# Patient Record
Sex: Male | Born: 1950 | Race: White | Hispanic: No | Marital: Married | Smoking: Never smoker
Health system: Southern US, Community
[De-identification: ages and names within clinical notes are randomized; demographics above are authoritative.]

## PROBLEM LIST (undated history)

## (undated) DIAGNOSIS — D62 Acute posthemorrhagic anemia: Secondary | ICD-10-CM

## (undated) DIAGNOSIS — C61 Malignant neoplasm of prostate: Secondary | ICD-10-CM

## (undated) DIAGNOSIS — L309 Dermatitis, unspecified: Secondary | ICD-10-CM

## (undated) DIAGNOSIS — I471 Supraventricular tachycardia, unspecified: Secondary | ICD-10-CM

## (undated) DIAGNOSIS — Z86010 Personal history of colonic polyps: Secondary | ICD-10-CM

## (undated) DIAGNOSIS — R972 Elevated prostate specific antigen [PSA]: Secondary | ICD-10-CM

## (undated) DIAGNOSIS — K922 Gastrointestinal hemorrhage, unspecified: Secondary | ICD-10-CM

## (undated) HISTORY — PX: COLONOSCOPY W/ BIOPSIES: SHX1374

## (undated) HISTORY — DX: Dermatitis, unspecified: L30.9

## (undated) HISTORY — PX: PROSTATE BIOPSY: SHX241

## (undated) HISTORY — DX: Gastrointestinal hemorrhage, unspecified: K92.2

---
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
## 2000-03-08 ENCOUNTER — Encounter: Admission: RE | Admit: 2000-03-08 | Discharge: 2000-03-08 | Payer: Self-pay | Admitting: Family Medicine

## 2000-03-08 ENCOUNTER — Encounter: Payer: Self-pay | Admitting: Family Medicine

## 2000-10-24 ENCOUNTER — Ambulatory Visit (HOSPITAL_COMMUNITY): Admission: RE | Admit: 2000-10-24 | Discharge: 2000-10-24 | Payer: Self-pay | Admitting: Orthopedic Surgery

## 2001-01-11 ENCOUNTER — Emergency Department (HOSPITAL_COMMUNITY): Admission: EM | Admit: 2001-01-11 | Discharge: 2001-01-11 | Payer: Self-pay | Admitting: Emergency Medicine

## 2001-01-11 ENCOUNTER — Encounter: Payer: Self-pay | Admitting: Emergency Medicine

## 2001-02-12 HISTORY — PX: ROTATOR CUFF REPAIR: SHX139

## 2004-09-19 ENCOUNTER — Emergency Department (HOSPITAL_COMMUNITY): Admission: EM | Admit: 2004-09-19 | Discharge: 2004-09-20 | Payer: Self-pay | Admitting: Emergency Medicine

## 2005-03-23 ENCOUNTER — Ambulatory Visit: Payer: Self-pay | Admitting: Internal Medicine

## 2005-03-28 ENCOUNTER — Ambulatory Visit: Payer: Self-pay | Admitting: Internal Medicine

## 2005-10-09 ENCOUNTER — Encounter: Admission: RE | Admit: 2005-10-09 | Discharge: 2005-10-09 | Payer: Self-pay | Admitting: Orthopedic Surgery

## 2006-10-18 ENCOUNTER — Encounter: Payer: Self-pay | Admitting: Internal Medicine

## 2007-01-21 ENCOUNTER — Telehealth: Payer: Self-pay | Admitting: Internal Medicine

## 2007-01-22 ENCOUNTER — Ambulatory Visit: Payer: Self-pay | Admitting: Internal Medicine

## 2007-01-22 DIAGNOSIS — J209 Acute bronchitis, unspecified: Secondary | ICD-10-CM | POA: Insufficient documentation

## 2007-03-24 ENCOUNTER — Ambulatory Visit: Payer: Self-pay | Admitting: Internal Medicine

## 2007-03-27 ENCOUNTER — Ambulatory Visit: Payer: Self-pay | Admitting: Internal Medicine

## 2007-03-27 LAB — CONVERTED CEMR LAB
ALT: 13 units/L (ref 0–53)
AST: 25 units/L (ref 0–37)
Albumin: 4 g/dL (ref 3.5–5.2)
Alkaline Phosphatase: 38 units/L — ABNORMAL LOW (ref 39–117)
BUN: 10 mg/dL (ref 6–23)
Basophils Absolute: 0 10*3/uL (ref 0.0–0.1)
Basophils Relative: 0.1 % (ref 0.0–1.0)
Bilirubin Urine: NEGATIVE
Bilirubin, Direct: 0.1 mg/dL (ref 0.0–0.3)
CO2: 31 meq/L (ref 19–32)
Calcium: 9.3 mg/dL (ref 8.4–10.5)
Chloride: 102 meq/L (ref 96–112)
Cholesterol: 153 mg/dL (ref 0–200)
Creatinine, Ser: 1 mg/dL (ref 0.4–1.5)
Eosinophils Absolute: 0 10*3/uL (ref 0.0–0.6)
Eosinophils Relative: 1.3 % (ref 0.0–5.0)
GFR calc Af Amer: 99 mL/min
GFR calc non Af Amer: 82 mL/min
Glucose, Bld: 98 mg/dL (ref 70–99)
HCT: 41.9 % (ref 39.0–52.0)
HDL: 50.3 mg/dL (ref >39.0)
Hemoglobin, Urine: NEGATIVE
Hemoglobin: 14.4 g/dL (ref 13.0–17.0)
Ketones, ur: NEGATIVE mg/dL
LDL Cholesterol: 90 mg/dL (ref 0–99)
Leukocytes, UA: NEGATIVE
Lymphocytes Relative: 53.9 % — ABNORMAL HIGH (ref 12.0–46.0)
MCHC: 34.2 g/dL (ref 30.0–36.0)
MCV: 89.5 fL (ref 78.0–100.0)
Monocytes Absolute: 0.3 10*3/uL (ref 0.2–0.7)
Monocytes Relative: 11.5 % — ABNORMAL HIGH (ref 3.0–11.0)
Neutro Abs: 0.8 10*3/uL — ABNORMAL LOW (ref 1.4–7.7)
Neutrophils Relative %: 33.2 % — ABNORMAL LOW (ref 43.0–77.0)
Nitrite: NEGATIVE
PSA: 0.32 ng/mL (ref 0.10–4.00)
Platelets: 137 10*3/uL — ABNORMAL LOW (ref 150–400)
Potassium: 4.1 meq/L (ref 3.5–5.1)
RBC: 4.69 M/uL (ref 4.22–5.81)
RDW: 12.3 % (ref 11.5–14.6)
Sodium: 139 meq/L (ref 135–145)
Specific Gravity, Urine: 1.02 (ref 1.000–1.03)
TSH: 4.53 microintl units/mL (ref 0.35–5.50)
Total Bilirubin: 0.8 mg/dL (ref 0.3–1.2)
Total CHOL/HDL Ratio: 3
Total Protein, Urine: NEGATIVE mg/dL
Total Protein: 6.9 g/dL (ref 6.0–8.3)
Triglycerides: 64 mg/dL (ref 0–149)
Urine Glucose: NEGATIVE mg/dL
Urobilinogen, UA: 0.2 (ref 0.0–1.0)
VLDL: 13 mg/dL (ref 0–40)
WBC: 2.5 10*3/uL — ABNORMAL LOW (ref 4.5–10.5)
pH: 5.5 (ref 5.0–8.0)

## 2007-04-01 ENCOUNTER — Ambulatory Visit: Payer: Self-pay | Admitting: Internal Medicine

## 2007-04-01 DIAGNOSIS — R799 Abnormal finding of blood chemistry, unspecified: Secondary | ICD-10-CM | POA: Insufficient documentation

## 2007-11-11 ENCOUNTER — Ambulatory Visit: Payer: Self-pay | Admitting: Internal Medicine

## 2008-08-13 ENCOUNTER — Ambulatory Visit: Payer: Self-pay | Admitting: Internal Medicine

## 2008-08-13 DIAGNOSIS — B029 Zoster without complications: Secondary | ICD-10-CM | POA: Insufficient documentation

## 2008-08-13 DIAGNOSIS — M545 Low back pain, unspecified: Secondary | ICD-10-CM | POA: Insufficient documentation

## 2008-08-13 DIAGNOSIS — M79609 Pain in unspecified limb: Secondary | ICD-10-CM | POA: Insufficient documentation

## 2008-08-31 ENCOUNTER — Ambulatory Visit: Payer: Self-pay | Admitting: Internal Medicine

## 2008-08-31 DIAGNOSIS — B079 Viral wart, unspecified: Secondary | ICD-10-CM | POA: Insufficient documentation

## 2008-11-23 ENCOUNTER — Ambulatory Visit: Payer: Self-pay | Admitting: Internal Medicine

## 2009-01-04 ENCOUNTER — Ambulatory Visit: Payer: Self-pay | Admitting: Internal Medicine

## 2009-01-04 LAB — CONVERTED CEMR LAB
ALT: 15 units/L (ref 0–53)
AST: 24 units/L (ref 0–37)
Albumin: 4.3 g/dL (ref 3.5–5.2)
Alkaline Phosphatase: 42 units/L (ref 39–117)
BUN: 11 mg/dL (ref 6–23)
Basophils Absolute: 0 10*3/uL (ref 0.0–0.1)
Basophils Relative: 0.7 % (ref 0.0–3.0)
Bilirubin Urine: NEGATIVE
Bilirubin, Direct: 0.1 mg/dL (ref 0.0–0.3)
CO2: 30 meq/L (ref 19–32)
Calcium: 9.3 mg/dL (ref 8.4–10.5)
Chloride: 107 meq/L (ref 96–112)
Cholesterol: 203 mg/dL — ABNORMAL HIGH (ref 0–200)
Creatinine, Ser: 1 mg/dL (ref 0.4–1.5)
Direct LDL: 134.2 mg/dL
Eosinophils Absolute: 0.1 10*3/uL (ref 0.0–0.7)
Eosinophils Relative: 2.8 % (ref 0.0–5.0)
GFR calc non Af Amer: 81.33 mL/min (ref >60)
Glucose, Bld: 98 mg/dL (ref 70–99)
HCT: 44.3 % (ref 39.0–52.0)
HDL: 48.4 mg/dL (ref >39.00)
Hemoglobin, Urine: NEGATIVE
Hemoglobin: 14.9 g/dL (ref 13.0–17.0)
Ketones, ur: NEGATIVE mg/dL
Leukocytes, UA: NEGATIVE
Lymphocytes Relative: 48 % — ABNORMAL HIGH (ref 12.0–46.0)
Lymphs Abs: 2.3 10*3/uL (ref 0.7–4.0)
MCHC: 33.6 g/dL (ref 30.0–36.0)
MCV: 94.2 fL (ref 78.0–100.0)
Monocytes Absolute: 0.6 10*3/uL (ref 0.1–1.0)
Monocytes Relative: 11.5 % (ref 3.0–12.0)
Neutro Abs: 1.8 10*3/uL (ref 1.4–7.7)
Neutrophils Relative %: 37 % — ABNORMAL LOW (ref 43.0–77.0)
Nitrite: NEGATIVE
PSA: 0.64 ng/mL (ref 0.10–4.00)
Platelets: 157 10*3/uL (ref 150.0–400.0)
Potassium: 4.6 meq/L (ref 3.5–5.1)
RBC: 4.7 M/uL (ref 4.22–5.81)
RDW: 12.3 % (ref 11.5–14.6)
Sodium: 142 meq/L (ref 135–145)
Specific Gravity, Urine: 1.025 (ref 1.000–1.030)
TSH: 2.85 microintl units/mL (ref 0.35–5.50)
Total Bilirubin: 1 mg/dL (ref 0.3–1.2)
Total CHOL/HDL Ratio: 4
Total Protein, Urine: NEGATIVE mg/dL
Total Protein: 6.8 g/dL (ref 6.0–8.3)
Triglycerides: 106 mg/dL (ref 0.0–149.0)
Urine Glucose: NEGATIVE mg/dL
Urobilinogen, UA: 0.2 (ref 0.0–1.0)
VLDL: 21.2 mg/dL (ref 0.0–40.0)
WBC: 4.8 10*3/uL (ref 4.5–10.5)
pH: 5.5 (ref 5.0–8.0)

## 2009-01-11 ENCOUNTER — Ambulatory Visit: Payer: Self-pay | Admitting: Internal Medicine

## 2009-01-11 DIAGNOSIS — R002 Palpitations: Secondary | ICD-10-CM | POA: Insufficient documentation

## 2010-03-14 NOTE — Assessment & Plan Note (Signed)
Summary: BRONCHITIS?/NWS   Vital Signs:  Patient Profile:   60 Years Old Male Weight:      175 pounds O2 Sat:      97 % Temp:     101.5 degrees F oral Pulse rate:   57 / minute BP sitting:   126 / 77  (left arm)  Vitals Entered By: Tora Perches (March 24, 2007 4:23 PM)             Is Patient Diabetic? No     Chief Complaint:  poss. bronchitis.  Acute Visit History:      The patient complains of cough and sore throat.  These symptoms began 3 days ago.  There is no history of wheezing or shortness of breath associated with his cough.        'Cold' or URI symptoms have been present with the sore throat.  There is no history of drooling or recent exposure to strep.        Current Allergies: No known allergies   Past Medical History:    Reviewed history from 01/22/2007 and no changes required:       H/o hot tub related eczema    Family History:    Reviewed history and no changes required:  Social History:    Reviewed history from 01/22/2007 and no changes required:       Married       Current Smoker Cigars       Occupation:   Risk Factors:  Tobacco use:  current    Physical Exam  General:     Well-developed,well-nourished,in no acute distress; alert,appropriate and cooperative throughout examination Ears:     R tM with erythema Nose:     no external erythema.   Mouth:     Erytrh. throat Lungs:     Normal respiratory effort, chest expands symmetrically. Lungs are clear to auscultation, no crackles or wheezes. Heart:     Normal rate and regular rhythm. S1 and S2 normal without gallop, murmur, click, rub or other extra sounds.    Impression & Recommendations:  Problem # 1:  BRONCHITIS, ACUTE (ICD-466.0) Assessment: New  His updated medication list for this problem includes:    Zithromax Z-pak 250 Mg Tabs (Azithromycin) .Marland Kitchen... As directed    His updated medication list for this problem includes:    Zithromax Z-pak 250 Mg Tabs (Azithromycin)  .Marland Kitchen... As directed    Promethazine-codeine 6.25-10 Mg/18ml Syrp (Promethazine-codeine) .Marland Kitchen... 5-10 ml qid prn   Complete Medication List: 1)  Zithromax Z-pak 250 Mg Tabs (Azithromycin) .... As directed 2)  Promethazine-codeine 6.25-10 Mg/56ml Syrp (Promethazine-codeine) .... 5-10 ml qid prn 3)  Fish Oil Oil (Fish oil) 4)  Adult Aspirin Low Strength 81 Mg Tbdp (Aspirin) .... Once daily 5)  Vitamin D3 1000 Unit Tabs (Cholecalciferol) .Marland Kitchen.. 1 qd     Prescriptions: ZITHROMAX Z-PAK 250 MG  TABS (AZITHROMYCIN) as directed  #1 x 0   Entered and Authorized by:   Tresa Garter MD   Signed by:   Tresa Garter MD on 03/24/2007   Method used:   Print then Give to Patient   RxID:   [id number]  ]

## 2010-03-14 NOTE — Assessment & Plan Note (Signed)
Summary: see phone note SD   Vital Signs:  Patient Profile:   60 Years Old Male Weight:      174 pounds Temp:     98.1 degrees F oral Pulse rate:   55 / minute BP sitting:   131 / 84  Vitals Entered By: Tora Perches (January 22, 2007 11:37 AM)             Is Patient Diabetic? No     Chief Complaint:  Cough.  History of Present Illness: The patient presents with complaints of sore throat, fever, cough, sinus congestion and drainge of several days duration. Not better with OTC meds. Chest hurts with coughing. Can't sleep due to cough. The mucus is colored.    Current Allergies: No known allergies   Past Medical History:    H/o hot tub related eczema   Social History:    Married   Risk Factors:  Tobacco use:  never    Physical Exam  General:     Well-developed,well-nourished,in no acute distress; alert,appropriate and cooperative throughout examination Ears:     External ear exam shows no significant lesions or deformities.  Otoscopic examination reveals clear canals, tympanic membranes are intact bilaterally without bulging, retraction, inflammation or discharge. Hearing is grossly normal bilaterally. Nose:     External nasal examination shows no deformity or inflammation. Nasal mucosa are pink and moist without lesions or exudates. Mouth:     eryth. throat. Neck:     No deformities, masses, or tenderness noted. Lungs:     Normal respiratory effort, chest expands symmetrically. Lungs are clear to auscultation, no crackles or wheezes. Heart:     Normal rate and regular rhythm. S1 and S2 normal without gallop, murmur, click, rub or other extra sounds. Abdomen:     Bowel sounds positive,abdomen soft and non-tender without masses, organomegaly or hernias noted.    Impression & Recommendations:  Problem # 1:  BRONCHITIS, ACUTE (ICD-466.0) Assessment: New  His updated medication list for this problem includes:    Zithromax Z-pak 250 Mg Tabs  (Azithromycin) .Marland Kitchen... As directed    Promethazine-codeine 6.25-10 Mg/52ml Syrp (Promethazine-codeine) .Marland Kitchen... 5-10 ml qid prn   Complete Medication List: 1)  Zithromax Z-pak 250 Mg Tabs (Azithromycin) .... As directed 2)  Promethazine-codeine 6.25-10 Mg/47ml Syrp (Promethazine-codeine) .... 5-10 ml qid prn 3)  Fish Oil Oil (Fish oil) 4)  Adult Aspirin Low Strength 81 Mg Tbdp (Aspirin) .... Once daily 5)  Vitamin D3 1000 Unit Tabs (Cholecalciferol) .Marland Kitchen.. 1 qd   Patient Instructions: 1)  Please schedule a follow-up appointment for wellness in 4 months.    Prescriptions: ZITHROMAX Z-PAK 250 MG  TABS (AZITHROMYCIN) as directed  #1 x 0   Entered and Authorized by:   Tresa Garter MD   Signed by:   Tresa Garter MD on 01/22/2007   Method used:   Print then Give to Patient   RxID:   [id number] PROMETHAZINE-CODEINE 6.25-10 MG/5ML  SYRP (PROMETHAZINE-CODEINE) 5-10 ml qid prn  #300 ml x 0   Entered and Authorized by:   Tresa Garter MD   Signed by:   Tresa Garter MD on 01/22/2007   Method used:   Print then Give to Patient   RxID:   [id number]  ]

## 2010-03-14 NOTE — Progress Notes (Signed)
Summary: FEVER, CONGESTION  Phone Note Call from Patient Call back at Home Phone [phone]   Caller: Patient Call For: T Summary of Call: FEVER, COUGH, CHEST CONGESTION. HOME PHONE IS [phone].   DR PLOTNIKOV'S PT.  HE HAS AN APPOINTMENT WITH DR Jonny Ruiz WED MORNING.  COULD HE BE WORKED INTO DR PLOTNIKOVS SCHEDULE INSTEAD OR STAY ON DR Raphael Gibney? Initial call taken by: Hilarie Fredrickson,  January 21, 2007 2:38 PM  Follow-up for Phone Call        I can see him at 11:30 Follow-up by: Tresa Garter MD,  January 21, 2007 5:35 PM  Additional Follow-up for Phone Call Additional follow up Details #1::        Patient informed, in IDX, cx apt w/Dr Jonny Ruiz Additional Follow-up by: Lamar Sprinkles,  January 21, 2007 6:26 PM

## 2010-03-14 NOTE — Assessment & Plan Note (Signed)
Summary: FLU SHOT---AVP---STC  Nurse Visit    Prior Medications: FISH OIL   OIL (FISH OIL) 1 by mouth bid ADULT ASPIRIN LOW STRENGTH 81 MG  TBDP (ASPIRIN) once daily VITAMIN D3 1000 UNIT  TABS (CHOLECALCIFEROL) 1 qd GLUCOSAMINE COMPLEX   TABS (NUTRITIONAL SUPPLEMENTS) 1 by mouth bid Current Allergies: No known allergies     Orders Added: 1)  Admin 1st Vaccine [90471] 2)  Flu Vaccine 60yrs + [16109]  Flu Vaccine Consent Questions     Do you have a history of severe allergic reactions to this vaccine? no    Any prior history of allergic reactions to egg and/or gelatin? no    Do you have a sensitivity to the preservative Thimersol? no    Do you have a past history of Guillan-Barre Syndrome? no    Do you currently have an acute febrile illness? no    Have you ever had a severe reaction to latex? no    Vaccine information given and explained to patient? yes    Are you currently pregnant? no    Lot Number:[id number]   Site Given  Left Deltoid IM-CCC]   ] .opcflu

## 2010-03-14 NOTE — Assessment & Plan Note (Signed)
Summary: FLU SHOT/NWS  Nurse Visit   Allergies: No Known Drug Allergies  Orders Added: 1)  Admin 1st Vaccine [90471] 2)  Flu Vaccine 63yrs + [94854]     Flu Vaccine Consent Questions     Do you have a history of severe allergic reactions to this vaccine? no    Any prior history of allergic reactions to egg and/or gelatin? no    Do you have a sensitivity to the preservative Thimersol? no    Do you have a past history of Guillan-Barre Syndrome? no    Do you currently have an acute febrile illness? no    Have you ever had a severe reaction to latex? no    Vaccine information given and explained to patient? yes    Are you currently pregnant? no    Lot Number:[id number]   Exp Date:08/11/2009   Site Given  Left Deltoid IMu

## 2010-03-14 NOTE — Assessment & Plan Note (Signed)
Summary: CPX/NWS   Vital Signs:  Patient Profile:   60 Years Old Male Weight:      171 pounds Temp:     97.2 degrees F oral Pulse rate:   52 / minute BP sitting:   114 / 67  (right arm)  Vitals Entered By: Tora Perches (April 01, 2007 3:06 PM)             Is Patient Diabetic? No     Chief Complaint:  Preventive Care.  History of Present Illness: The patient presents for a wellness examination     Current Allergies (reviewed today): No known allergies   Past Medical History:    Reviewed history from 01/22/2007 and no changes required:       H/o hot tub related eczema   Family History:    Reviewed history and no changes required:       Old age  Social History:    Reviewed history from 03/24/2007 and no changes required:       Married       Cigars 4 times a year       Occupation:              Never Smoked       Regular exercise-yes, cycling   Risk Factors:  Tobacco use:  never Exercise:  yes   Review of Systems  The patient denies anorexia, weight loss, hoarseness, chest pain, syncope, peripheral edema, prolonged cough, abdominal pain, melena, hematochezia, muscle weakness, depression, abnormal bleeding, and angioedema.     Physical Exam  General:     Well-developed,well-nourished,in no acute distress; alert,appropriate and cooperative throughout examination Head:     Normocephalic and atraumatic without obvious abnormalities. No apparent alopecia or balding. Eyes:     No corneal or conjunctival inflammation noted. EOMI. Perrla. Funduscopic exam benign, without hemorrhages, exudates or papilledema. Vision grossly normal. Ears:     External ear exam shows no significant lesions or deformities.  Otoscopic examination reveals clear canals, tympanic membranes are intact bilaterally without bulging, retraction, inflammation or discharge. Hearing is grossly normal bilaterally. Nose:     External nasal examination shows no deformity or inflammation.  Nasal mucosa are pink and moist without lesions or exudates. Mouth:     Oral mucosa and oropharynx without lesions or exudates.  Teeth in good repair. Neck:     No deformities, masses, or tenderness noted. Chest Wall:     No deformities, masses, tenderness or gynecomastia noted. Lungs:     Normal respiratory effort, chest expands symmetrically. Lungs are clear to auscultation, no crackles or wheezes. Heart:     Normal rate and regular rhythm. S1 and S2 normal without gallop, murmur, click, rub or other extra sounds. Abdomen:     Bowel sounds positive,abdomen soft and non-tender without masses, organomegaly or hernias noted. Rectal:     No external abnormalities noted. Normal sphincter tone. No rectal masses or tenderness. Genitalia:     Testes bilaterally descended without nodularity, tenderness or masses. No scrotal masses or lesions. No penis lesions or urethral discharge. Prostate:     Prostate gland firm and smooth, no enlargement, nodularity, tenderness, mass, asymmetry or induration. Msk:     No deformity or scoliosis noted of thoracic or lumbar spine.   Extremities:     No clubbing, cyanosis, edema, or deformity noted with normal full range of motion of all joints.   Neurologic:     No cranial nerve deficits noted. Station and gait are normal. Plantar  reflexes are down-going bilaterally. DTRs are symmetrical throughout. Sensory, motor and coordinative functions appear intact. Skin:     Intact without suspicious lesions or rashes Psych:     Cognition and judgment appear intact. Alert and cooperative with normal attention span and concentration. No apparent delusions, illusions, hallucinations    Impression & Recommendations:  Problem # 1:  WELL ADULT EXAM (ICD-V70.0) Assessment: Unchanged EKG is OK dT 2023f Orders: EKG w/ Interpretation (93000) T-2 View CXR, Same Day (71020.5TC)   Problem # 2:  CBC, ABNORMAL (ICD-790.99) Assessment: New Poss. postviral Recheck in 2  mo Orders: T-2 View CXR, Same Day (71020.5TC)   Complete Medication List: 1)  Fish Oil Oil (Fish oil) .Marland Kitchen.. 1 by mouth bid 2)  Adult Aspirin Low Strength 81 Mg Tbdp (Aspirin) .... Once daily 3)  Vitamin D3 1000 Unit Tabs (Cholecalciferol) .Marland Kitchen.. 1 qd 4)  Glucosamine Complex Tabs (Nutritional supplements) .Marland Kitchen.. 1 by mouth bid   Patient Instructions: 1)  In 2 months: 2)  TSH prior to visit, ICD-9: 3)  CBC w/ Diff prior to visit, ICD-9: 4)  ESR 790.99    ]

## 2010-03-14 NOTE — Assessment & Plan Note (Signed)
Summary: CPX/BCBS/#/CD   Vital Signs:  Patient profile:   60 year old male Weight:      185 pounds Temp:     97.2 degrees F oral Pulse rate:   54 / minute BP sitting:   126 / 84  (left arm)  Vitals Entered By: Tora Perches (January 11, 2009 3:13 PM) CC: cpx Is Patient Diabetic? No   CC:  cpx.  History of Present Illness: The patient presents for a wellness examination  C/o a couple episodes at night - woke up w/rapid shallow beat x 30-45 sec; stopped w/coughing (last 6 wks ago). No pattern  Preventive Screening-Counseling & Management  Alcohol-Tobacco     Smoking Status: never  Current Medications (verified): 1)  Fish Oil   Oil (Fish Oil) .Marland Kitchen.. 1 By Mouth Bid 2)  Adult Aspirin Low Strength 81 Mg  Tbdp (Aspirin) .... Once Daily 3)  Vitamin D3 1000 Unit  Tabs (Cholecalciferol) .Marland Kitchen.. 1 Qd 4)  Glucosamine Complex   Tabs (Nutritional Supplements) .Marland Kitchen.. 1 By Mouth Bid 5)  Vit C 200mg  .... Once Daily 6)  Multivitamins  Tabs (Multiple Vitamin) .... Once Daily  Allergies (verified): No Known Drug Allergies  Past History:  Past Medical History: Last updated: 08/31/2008 H/o hot tub related eczema H zoster 2010  Past Surgical History: Last updated: 10/18/2006 Rotator cuff repair  right side in 2003  Family History: Old age M had a pacemaker  Social History: Married Cigars 4 times a year Occupation: started a new business  Never Smoked Regular exercise-yes, cycling  Review of Systems  The patient denies anorexia, fever, weight loss, weight gain, vision loss, decreased hearing, hoarseness, chest pain, syncope, dyspnea on exertion, peripheral edema, prolonged cough, headaches, hemoptysis, abdominal pain, melena, hematochezia, severe indigestion/heartburn, hematuria, incontinence, genital sores, muscle weakness, suspicious skin lesions, transient blindness, difficulty walking, depression, unusual weight change, abnormal bleeding, enlarged lymph nodes, angioedema, and  testicular masses.    Physical Exam  General:  Well-developed,well-nourished,in no acute distress; alert,appropriate and cooperative throughout examination Head:  Normocephalic and atraumatic without obvious abnormalities. No apparent alopecia or balding. Eyes:  No corneal or conjunctival inflammation noted. EOMI. Perrla. Ears:  External ear exam shows no significant lesions or deformities.  Otoscopic examination reveals clear canals, tympanic membranes are intact bilaterally without bulging, retraction, inflammation or discharge. Hearing is grossly normal bilaterally. Nose:  External nasal examination shows no deformity or inflammation. Nasal mucosa are pink and moist without lesions or exudates. Mouth:  Oral mucosa and oropharynx without lesions or exudates.  Teeth in good repair. Neck:  No deformities, masses, or tenderness noted. Chest Wall:  No deformities, masses, tenderness or gynecomastia noted. Lungs:  Normal respiratory effort, chest expands symmetrically. Lungs are clear to auscultation, no crackles or wheezes. Heart:  Normal rate and regular rhythm. S1 and S2 normal without gallop, murmur, click, rub or other extra sounds. Abdomen:  Bowel sounds positive,abdomen soft and non-tender without masses, organomegaly or hernias noted. Rectal:  No external abnormalities noted. Normal sphincter tone. No rectal masses or tenderness. Genitalia:  Testes bilaterally descended without nodularity, tenderness or masses. No scrotal masses or lesions. No penis lesions or urethral discharge. Prostate:  Prostate gland firm and smooth, no enlargement, nodularity, tenderness, mass, asymmetry or induration. Msk:  No deformity or scoliosis noted of thoracic or lumbar spine.   Pulses:  R and L carotid,radial,femoral,dorsalis pedis and posterior tibial pulses are full and equal bilaterally Extremities:  No clubbing, cyanosis, edema, or deformity noted with normal full range  of motion of all joints.     Neurologic:  No cranial nerve deficits noted. Station and gait are normal. Plantar reflexes are down-going bilaterally. DTRs are symmetrical throughout. Sensory, motor and coordinative functions appear intact. Skin:  Intact without suspicious lesions or rashes Cervical Nodes:  No lymphadenopathy noted Inguinal Nodes:  No significant adenopathy Psych:  Cognition and judgment appear intact. Alert and cooperative with normal attention span and concentration. No apparent delusions, illusions, hallucinations   Impression & Recommendations:  Problem # 1:  WELL ADULT EXAM (ICD-V70.0) Assessment Comment Only The labs were reviewed with the patient. Health and age related issues were discussed. Available screening tests and vaccinations were discussed as well. Healthy life style including good diet and execise was discussed.  Orders: EKG w/ Interpretation (93000) S brady  Problem # 2:  PALPITATIONS, OCCASIONAL (ICD-785.1) poss SVT Assessment: New Card cons. suggested - he wants to wait and see  Problem # 3:  CBC, ABNORMAL (ICD-790.99) resolved Assessment: Comment Only  Complete Medication List: 1)  Fish Oil Oil (Fish oil) .Marland Kitchen.. 1 by mouth bid 2)  Adult Aspirin Low Strength 81 Mg Tbdp (Aspirin) .... Once daily 3)  Vitamin D3 1000 Unit Tabs (Cholecalciferol) .Marland Kitchen.. 1 qd 4)  Glucosamine Complex Tabs (Nutritional supplements) .Marland Kitchen.. 1 by mouth bid 5)  Vit C 200mg   .... Once daily 6)  Multivitamins Tabs (Multiple vitamin) .... Once daily  Patient Instructions: 1)  Please schedule a follow-up appointment in 12 months. 2)  Try to eat more raw plant food, fresh and dry fruit, raw almonds, leafy vegetables, whole foods and less red meat, less animal fat. Poultry and fish is better for you than pork and beef. Avoid processed foods (canned soups, hot dogs, sausage, bacon , frozen dinners). Avoid corn syrup, high fructose syrup or aspartam and Splenda  containing drinks. Honey, Agave and Stevia are better  sweeteners. Make your own  dressing with olive oil, wine vinegar, lemon juce, garlic etc. for your salads.

## 2010-03-14 NOTE — Assessment & Plan Note (Signed)
Summary: ?shingles/$50/cd   Vital Signs:  Patient profile:   60 year old male Height:      71 inches Weight:      179 pounds BMI:     25.06 Temp:     98 degrees F oral Pulse rate:   67 / minute BP sitting:   110 / 64  (left arm)  Vitals Entered By: Tora Perches (August 13, 2008 4:19 PM) CC: poss. shingles Is Patient Diabetic? No   CC:  poss. shingles.  History of Present Illness: R LBP, rash with dull pain and itching started on last wknd. Not better  Current Medications (verified): 1)  Fish Oil   Oil (Fish Oil) .Marland Kitchen.. 1 By Mouth Bid 2)  Adult Aspirin Low Strength 81 Mg  Tbdp (Aspirin) .... Once Daily 3)  Vitamin D3 1000 Unit  Tabs (Cholecalciferol) .Marland Kitchen.. 1 Qd 4)  Glucosamine Complex   Tabs (Nutritional Supplements) .Marland Kitchen.. 1 By Mouth Bid  Allergies (verified): No Known Drug Allergies  Past History:  Past Medical History: Last updated: 01/22/2007 H/o hot tub related eczema  Past Surgical History: Last updated: 10/18/2006 Rotator cuff repair  right side in 2003  Family History: Last updated: 04/01/2007 Old age  Social History: Last updated: 04/01/2007 Married Cigars 4 times a year Occupation:  Never Smoked Regular exercise-yes, cycling  Review of Systems  The patient denies fever, chest pain, dyspnea on exertion, hemoptysis, and abdominal pain.    Physical Exam  General:  Well-developed,well-nourished,in no acute distress; alert,appropriate and cooperative throughout examination Nose:  External nasal examination shows no deformity or inflammation. Nasal mucosa are pink and moist without lesions or exudates. Mouth:  Oral mucosa and oropharynx without lesions or exudates.  Teeth in good repair. Neck:  No deformities, masses, or tenderness noted. Lungs:  Normal respiratory effort, chest expands symmetrically. Lungs are clear to auscultation, no crackles or wheezes. Heart:  Normal rate and regular rhythm. S1 and S2 normal without gallop, murmur, click, rub or  other extra sounds. Abdomen:  Bowel sounds positive,abdomen soft and non-tender without masses, organomegaly or hernias noted. Msk:  No deformity or scoliosis noted of thoracic or lumbar spine.   Skin:  Multiple erythematous patches with vesicles on R LS spine and R leg and dorsal R foot Psych:  Cognition and judgment appear intact. Alert and cooperative with normal attention span and concentration. No apparent delusions, illusions, hallucinations   Impression & Recommendations:  Problem # 1:  LEG PAIN, RIGHT (ICD-729.5) due to #3 Assessment New  Problem # 2:  LOW BACK PAIN (ICD-724.2)  His updated medication list for this problem includes:    Adult Aspirin Low Strength 81 Mg Tbdp (Aspirin) ..... Once daily    Hydrocodone-acetaminophen 5-325 Mg Tabs (Hydrocodone-acetaminophen) .Marland Kitchen... 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 1 by mouth bid 2)  Adult Aspirin Low Strength 81 Mg Tbdp (Aspirin) .... Once daily 3)  Vitamin D3 1000 Unit Tabs (Cholecalciferol) .Marland Kitchen.. 1 qd 4)  Glucosamine Complex Tabs (Nutritional supplements) .Marland Kitchen.. 1 by mouth bid 5)  Acyclovir 800 Mg Tabs (Acyclovir) .Marland Kitchen.. 1 by mouth 5 times a day x 7 d 6)  Prednisone 10 Mg Tabs (Prednisone) .... Take 40mg  qd for 3 days, then 20 mg qd for 3 days, then 10mg  qd for 6 days,  then stop. take pc. 7)  Triamcinolone Acetonide 0.5 % Crea (Triamcinolone acetonide) .... Use qid prn 8)  Hydrocodone-acetaminophen 5-325 Mg Tabs (Hydrocodone-acetaminophen) .Marland Kitchen.. 1 by mouth up to 4 times per day as needed for pain  Patient Instructions: 1)  .Please schedule a follow-up appointment in 2 weeks. 2)  Try to eat more raw plant food, fresh and dry fruit, raw almonds, leafy vegies, whole foods and less meat, animal fat. Avoid processed foods, (canned soups, hot dogs, sausage , frozen dinners),  animal fat, red meat.   3)  Call if you are not better in a reasonable amount of time or if worse.  Prescriptions: ACYCLOVIR 800 MG TABS (ACYCLOVIR) 1 by mouth 5 times a day x 7 d  #35 x 1   Entered and Authorized by:   Tresa Garter MD   Signed by:   Tresa Garter MD on 08/13/2008   Method used:   Electronically to        [facility] [address] [phone]* (retail)       [address]       [place], [place]  [address]       Ph: [phone] or [phone]       Fax: [phone]   RxID:   [id number] TRIAMCINOLONE ACETONIDE 0.5 % CREA (TRIAMCINOLONE ACETONIDE) use qid prn  #120 g x 1   Entered and Authorized by:   Tresa Garter MD   Signed by:   Tresa Garter MD on 08/13/2008   Method used:   Electronically to        [facility] [address] [phone]* (retail)       [address]       [place], [place]  [address]       Ph: [phone] or [phone]       Fax: [phone]   RxID:   [id number] HYDROCODONE-ACETAMINOPHEN 5-325 MG TABS (HYDROCODONE-ACETAMINOPHEN) 1 by mouth up to 4 times per day as needed for pain  #90 x 0   Entered and Authorized by:   Tresa Garter MD   Signed by:   Tresa Garter MD on 08/13/2008   Method used:   Print then Give to Patient   RxID:   [id number] TRIAMCINOLONE ACETONIDE 0.5 % CREA (TRIAMCINOLONE ACETONIDE) use qid prn  #120 g x 1   Entered and Authorized by:   Tresa Garter MD   Signed by:   Tresa Garter MD on 08/13/2008   Method used:   Print then Give to Patient   RxID:   [id number] PREDNISONE 10 MG  TABS (PREDNISONE) Take 40mg  qd for 3 days, then 20 mg qd for 3 days, then 10mg  qd for 6 days, then stop. Take pc.  #24 x 0   Entered and Authorized by:   Tresa Garter MD   Signed by:   Tresa Garter MD on 08/13/2008   Method used:   Print then Give to Patient   RxID:   [id number] ACYCLOVIR 800 MG TABS (ACYCLOVIR) 1 by mouth 5 times a  day x 7 d  #35 x 1   Entered and Authorized by:   Tresa Garter MD   Signed by:   Tresa Garter MD on 08/13/2008   Method used:   Print then Give to Patient   RxID:   [id number]

## 2010-05-14 HISTORY — PX: APPENDECTOMY: SHX54

## 2010-06-30 NOTE — Op Note (Signed)
[facility]  Patient:    Jacob [name], MODER Visit Number: [id number] MRN: [id number]          Service Type: DSU Location: DAY Attending Physician:  Marlowe Kays Page Proc. Date: 10/24/00 Admit Date:  10/24/2000                             Operative Report  PREOPERATIVE DIAGNOSES: 1. Painful degenerative arthritis acromioclavicular joint. 2. 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  POSTOPERATIVE DIAGNOSES: 1. Painful degenerative arthritis acromioclavicular joint. 2. 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 3. Minor labral disruption.  OPERATION:  Right shoulder arthroscopy with: 1. Debridement of minor labral disruption. 2. Arthroscopic subacromial decompression. 3. Open resection distal clavicle.  SURGEON:  Illene Labrador. [place], M.D.  ASSISTANT:  Dorie Rank, P.A.-C.  ANESTHESIA:  General.  PATHOLOGY AND JUSTIFICATION FOR PROCEDURE:  He is an avid Armed forces operational officer and has had progressive significant pain in the right shoulder.  He was some better with rest but on resumption of activities, the pain has recurred.  On plain x-rays, he has arthritic changes in the [facility] joint and a downsloping acromion.  On MRI, he has edema at the [facility] joint with a large spur on the underneath surface of the clavicle, digging into the rotator cuff as well as acromial impingement on the rotator cuff with partial rotator cuff tear. Accordingly, he is here today for the above-mentioned surgery.  DESCRIPTION OF PROCEDURE:  Satisfactory general anesthesia, beach chair position on the Schlein frame.  The right shoulder girdle was prepped with DuraPrep and draped in a sterile field.  The anatomy of the shoulder joint was marked out, including the coracoid process, lateral portal, and posterior soft spot portal.  The two portals and the subacromial space were all infiltrated with 0.5% Marcaine with  adrenalin.  Through a posterior soft spot portal, I made an atraumatic entrance into the glenohumeral joint.  On inspection, the biceps tendon, rotator cuff, and humeral head were normal.  He had some partial but significant fraying of the labrum without any apparent tear.  I felt that this was pathologic and accordingly, I advanced the scope between the biceps tendon and the subscapularis.  Using a switching stick, I made an anterior portal, over which I placed a 4.5 cannula and introduced the 4.2 shaver and shaved down the abnormal portion of the labrum, which I had checked for detachment, and there was none.  Final pictures were taken.  I then removed fluid from the glenohumeral joint and redirected the scope in the subacromial area.  I then made a lateral portal and introduced a blunt trocar, followed the 4.2 shaver.  He had a large amount of subdeltoid bursitis but also had very narrowed subacromial arch.  A good bit of the bursa was removed with the shaver, and I then introduced the Arthrocare vaporizer.  Initial pictures were taken, and I then began removing soft tissue from around the underneath surface of the acromion as well as the coracoacromial ligament. When I removed sufficient soft tissue, I then introduce a 4.0 bur and began burring down the underneath surface of the acromion.  I then worked back and forth between the bur, the vaporizer, and the shaver, correcting bleeders as we went and continued the decompression until we had very substantial subacromial space.  Final pictures were taken with the arm both at  the side and abducted, showing no residual impingement.  I then withdrew the scope from the subacromial area and made a small open incision over the distal clavicle, identifying the [facility] joint which was badly disrupted and with subperiosteal dissection, dissected out the distal clavicle.  I measured 2 cm from this lateral-most portion, made a mark on the clavicle, and  then undermined the clavicle at this point and protecting with retractors, used a microsaw to remove the distal clavicle.  It had a large spur impaling into the rotator cuff as depicted on the MRI.  After removing some minor spicules of bone on the cut end of the clavicle, I placed bone wax on it and packed the cavity with Gelfoam.  I then closed the fascia with interrupted #1 Vicryl as well as the subcutaneous tissue.  Skin was closed with staples and the portals with 4-0 nylon.  Betadine and Adaptic dry sterile dressing were applied followed by a shoulder immobilizer.  At the time of this dictation, he was on his way to the recovery room in satisfactory condition having tolerated the procedure well. Attending Physician:  Joaquin Courts DD:  10/24/00 TD:  10/24/00 Job: [id number]

## 2010-12-04 ENCOUNTER — Emergency Department (HOSPITAL_COMMUNITY): Payer: BC Managed Care – PPO

## 2010-12-04 ENCOUNTER — Inpatient Hospital Stay (HOSPITAL_COMMUNITY)
Admission: EM | Admit: 2010-12-04 | Discharge: 2010-12-05 | DRG: 452 | Disposition: A | Discharge status: Home / Self Care | Payer: BC Managed Care – PPO | Attending: Internal Medicine | Admitting: Internal Medicine

## 2010-12-04 DIAGNOSIS — K922 Gastrointestinal hemorrhage, unspecified: Secondary | ICD-10-CM

## 2010-12-04 DIAGNOSIS — K648 Other hemorrhoids: Secondary | ICD-10-CM | POA: Diagnosis present

## 2010-12-04 DIAGNOSIS — D62 Acute posthemorrhagic anemia: Secondary | ICD-10-CM | POA: Diagnosis present

## 2010-12-04 DIAGNOSIS — M129 Arthropathy, unspecified: Secondary | ICD-10-CM | POA: Diagnosis present

## 2010-12-04 DIAGNOSIS — D696 Thrombocytopenia, unspecified: Secondary | ICD-10-CM | POA: Diagnosis present

## 2010-12-04 DIAGNOSIS — Z9889 Other specified postprocedural states: Secondary | ICD-10-CM

## 2010-12-04 DIAGNOSIS — I959 Hypotension, unspecified: Secondary | ICD-10-CM | POA: Diagnosis present

## 2010-12-04 DIAGNOSIS — R5381 Other malaise: Secondary | ICD-10-CM | POA: Diagnosis present

## 2010-12-04 DIAGNOSIS — K5730 Diverticulosis of large intestine without perforation or abscess without bleeding: Secondary | ICD-10-CM | POA: Diagnosis present

## 2010-12-04 DIAGNOSIS — Y838 Other surgical procedures as the cause of abnormal reaction of the patient, or of later complication, without mention of misadventure at the time of the procedure: Secondary | ICD-10-CM | POA: Diagnosis present

## 2010-12-04 DIAGNOSIS — E860 Dehydration: Secondary | ICD-10-CM | POA: Diagnosis present

## 2010-12-04 DIAGNOSIS — IMO0002 Reserved for concepts with insufficient information to code with codable children: Principal | ICD-10-CM | POA: Diagnosis present

## 2010-12-04 DIAGNOSIS — R0789 Other chest pain: Secondary | ICD-10-CM | POA: Diagnosis present

## 2010-12-04 DIAGNOSIS — I059 Rheumatic mitral valve disease, unspecified: Secondary | ICD-10-CM | POA: Diagnosis present

## 2010-12-04 LAB — DIFFERENTIAL
Basophils Absolute: 0 10*3/uL (ref 0.0–0.1)
Basophils Absolute: 0 10*3/uL (ref 0.0–0.1)
Basophils Relative: 0 % (ref 0–1)
Basophils Relative: 0 % (ref 0–1)
Eosinophils Absolute: 0.1 10*3/uL (ref 0.0–0.7)
Eosinophils Absolute: 0 10*3/uL (ref 0.0–0.7)
Eosinophils Relative: 1 % (ref 0–5)
Eosinophils Relative: 1 % (ref 0–5)
Lymphocytes Relative: 22 % (ref 12–46)
Lymphocytes Relative: 18 % (ref 12–46)
Lymphs Abs: 1.4 10*3/uL (ref 0.7–4.0)
Lymphs Abs: 1.2 10*3/uL (ref 0.7–4.0)
Monocytes Absolute: 0.4 10*3/uL (ref 0.1–1.0)
Monocytes Absolute: 0.4 10*3/uL (ref 0.1–1.0)
Monocytes Relative: 6 % (ref 3–12)
Monocytes Relative: 6 % (ref 3–12)
Neutro Abs: 4.7 10*3/uL (ref 1.7–7.7)
Neutro Abs: 4.8 10*3/uL (ref 1.7–7.7)
Neutrophils Relative %: 71 % (ref 43–77)
Neutrophils Relative %: 75 % (ref 43–77)

## 2010-12-04 LAB — CBC
HCT: 27.3 % — ABNORMAL LOW (ref 39.0–52.0)
HCT: 26 % — ABNORMAL LOW (ref 39.0–52.0)
HCT: 27.8 % — ABNORMAL LOW (ref 39.0–52.0)
HCT: 27.2 % — ABNORMAL LOW (ref 39.0–52.0)
Hemoglobin: 9.4 g/dL — ABNORMAL LOW (ref 13.0–17.0)
Hemoglobin: 8.7 g/dL — ABNORMAL LOW (ref 13.0–17.0)
Hemoglobin: 9.5 g/dL — ABNORMAL LOW (ref 13.0–17.0)
Hemoglobin: 9.2 g/dL — ABNORMAL LOW (ref 13.0–17.0)
MCH: 31.4 pg (ref 26.0–34.0)
MCH: 30.6 pg (ref 26.0–34.0)
MCH: 30.9 pg (ref 26.0–34.0)
MCH: 30.8 pg (ref 26.0–34.0)
MCHC: 34.4 g/dL (ref 30.0–36.0)
MCHC: 33.5 g/dL (ref 30.0–36.0)
MCHC: 34.2 g/dL (ref 30.0–36.0)
MCHC: 33.8 g/dL (ref 30.0–36.0)
MCV: 91.3 fL (ref 78.0–100.0)
MCV: 91.5 fL (ref 78.0–100.0)
MCV: 90.6 fL (ref 78.0–100.0)
MCV: 91 fL (ref 78.0–100.0)
Platelets: 124 10*3/uL — ABNORMAL LOW (ref 150–400)
Platelets: 104 10*3/uL — ABNORMAL LOW (ref 150–400)
Platelets: 118 10*3/uL — ABNORMAL LOW (ref 150–400)
Platelets: 113 10*3/uL — ABNORMAL LOW (ref 150–400)
RBC: 2.99 MIL/uL — ABNORMAL LOW (ref 4.22–5.81)
RBC: 2.84 MIL/uL — ABNORMAL LOW (ref 4.22–5.81)
RBC: 3.07 MIL/uL — ABNORMAL LOW (ref 4.22–5.81)
RBC: 2.99 MIL/uL — ABNORMAL LOW (ref 4.22–5.81)
RDW: 12.6 % (ref 11.5–15.5)
RDW: 12.7 % (ref 11.5–15.5)
RDW: 13.2 % (ref 11.5–15.5)
RDW: 13.7 % (ref 11.5–15.5)
WBC: 6.7 10*3/uL (ref 4.0–10.5)
WBC: 6.4 10*3/uL (ref 4.0–10.5)
WBC: 5.6 10*3/uL (ref 4.0–10.5)
WBC: 4.8 10*3/uL (ref 4.0–10.5)

## 2010-12-04 LAB — BASIC METABOLIC PANEL
BUN: 29 mg/dL — ABNORMAL HIGH (ref 6–23)
CO2: 24 mEq/L (ref 19–32)
Calcium: 7.9 mg/dL — ABNORMAL LOW (ref 8.4–10.5)
Chloride: 110 mEq/L (ref 96–112)
Creatinine, Ser: 1.05 mg/dL (ref 0.50–1.35)
GFR calc Af Amer: 87 mL/min — ABNORMAL LOW (ref >90)
GFR calc non Af Amer: 75 mL/min — ABNORMAL LOW (ref >90)
Glucose, Bld: 117 mg/dL — ABNORMAL HIGH (ref 70–99)
Potassium: 3.9 mEq/L (ref 3.5–5.1)
Sodium: 141 mEq/L (ref 135–145)

## 2010-12-04 LAB — CARDIAC PANEL(CRET KIN+CKTOT+MB+TROPI)
CK, MB: 3.3 ng/mL (ref 0.3–4.0)
Relative Index: 2.4 (ref 0.0–2.5)
Total CK: 138 U/L (ref 7–232)
Troponin I: <0.3 ng/mL (ref <0.30)

## 2010-12-04 LAB — ABO/RH: ABO/RH(D): B POS

## 2010-12-04 LAB — PROTIME-INR
INR: 1.18 (ref 0.00–1.49)
Prothrombin Time: 15.3 seconds — ABNORMAL HIGH (ref 11.6–15.2)

## 2010-12-04 LAB — CK TOTAL AND CKMB (NOT AT ARMC)
CK, MB: 3.4 ng/mL (ref 0.3–4.0)
Relative Index: 2.4 (ref 0.0–2.5)
Total CK: 142 U/L (ref 7–232)

## 2010-12-04 LAB — APTT: aPTT: 24 seconds (ref 24–37)

## 2010-12-04 LAB — POCT I-STAT TROPONIN I: Troponin i, poc: 0 ng/mL (ref 0.00–0.08)

## 2010-12-04 LAB — TYPE AND SCREEN: ABO/RH(D): B POS

## 2010-12-04 LAB — TROPONIN I: Troponin I: <0.3 ng/mL (ref <0.30)

## 2010-12-04 MED ORDER — IOHEXOL 300 MG/ML  SOLN
100.0000 mL | Freq: Once | INTRAMUSCULAR | Status: AC | PRN
  Administered 2010-12-04: 100 mL via INTRAVENOUS

## 2010-12-04 NOTE — H&P (Signed)
NAMEOMARRI, EICH NO.:  [id number]  MEDICAL RECORD NO.:  [id number]  LOCATION:  WLED                         FACILITY:  [facility]  PHYSICIAN:  Talmage Nap, MD  DATE OF BIRTH:  11-22-1950  DATE OF ADMISSION:  12/04/2010 DATE OF DISCHARGE:                             HISTORY & PHYSICAL   PRIMARY CARE PHYSICIAN:  Unassigned.  PRIMARY GASTROENTEROLOGIST:  Dr. Jason Fila at [place].  HISTORY:  Obtainable from the patient and the patient's spouse.  CHIEF COMPLAINT:  Rectal bleeding and abdominal cramp of 1 day's duration following colonoscopy plus polypectomy  HISTORY OF PRESENT ILLNESS:  The patient is a 60 year old very pleasant African American male who had a colonoscopy done by Dr. Jason Fila at [place] on Friday.  The indication for the colonoscopy was an adenomatous mass seen during laparoscopic appendectomy 6 months ago. Colonoscopy was said to have been uneventful and postprocedure the patient was said to have done well and subsequently went home.  However, a day prior to presenting to the emergency room, the patient developed generalized abdominal cramps with associated rectal bleed.  Initially, the rectal bleed was small in quantity, and thereafter, it became unquantifiable.  The patient was said to be feeling very dizzy and lightheaded, and subsequently emergency medical service was called and the patient was brought to the hospital for evaluation.  In the emergency room, the patient was found to be hypotensive and he was rigorously resuscitated with IV fluids and transfused with 1 unit of packed RBCs.  At the time, the patient was seen by me.  He denied any lightheadedness.  He denied any chest pain or shortness of breath and the abdominal cramps had reduced remarkably in intensity.  He was also said to be having slight bleeding per rectum.  GI physician, Dr. Juanda Chance was consulted by the ED physician and an evaluation by her is still being  awaited.  After the patient was seen by me, he will be admitted to step-down for close monitoring.  PAST MEDICAL HISTORY:  Positive for arthritis, which the patient takes Aleve and then mitral valve prolapse for which the patient takes antibiotics 30 minutes before dental procedure.  PAST SURGICAL HISTORY:  Laparoscopic appendectomy with an adenomatous mass seen in the appendix and most recently colonoscopy done 4 days ago prior to this admission [place] by Dr. Jason Fila.  Preadmission meds include aspirin, dose unknown and Aleve dose unknown.  SOCIAL HISTORY:  Positive for occasional tobacco use.  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  FAMILY HISTORY:  York Spaniel to be positive for coronary artery disease.  ALLERGIES:  No known allergies.  REVIEW OF SYSTEMS:  The patient presently denies any headaches and no blurry vision.  No nausea or vomiting.  No fever.  No chills.  No rigor. No chest pain or shortness of breath.  No PND or orthopnea.  No cough. Denies any abdominal discomfort.  He has slight painless rectal bleed. No swelling of the lower extremities.  No intolerance of heat or cold. No neuropsychiatric disorder.  PHYSICAL EXAMINATION:  GENERAL:  Very pleasant man with suboptimal  hydration not in any obvious respiratory distress. VITAL SIGNS:  Initial vital signs prior to blood transfusion, blood pressure was 98/51, pulse 52, respiratory rate 19, temperature 96.6. Vitals after blood transfusion; blood pressure is 104/66, pulse 61, respiratory rate 25. HEENT:  Mild pallor.  Pupils are reactive to light and extraocular muscles are intact. NECK:  No jugular venous distention.  No carotid bruit.  No lymphadenopathy. CHEST:  Clear to auscultation. HEART:  S1 and S2. ABDOMEN:  Soft, nontender, nondistended.  Liver, spleen, kidney not palpable.  Bowel sounds are  positive. EXTREMITIES:  No pedal edema. NEUROLOGIC:  Nonfocal. MUSCULOSKELETAL SYSTEM:  Unremarkable. SKIN:  Shows slightly decreased turgor.  LABORATORY DATA:  First set of cardiac markers, troponin-I 0.00. Hematological indices prior to blood transfusion, WBC is 6.4, hemoglobin is 8.7, hematocrit is 26.0, MCV is 91.5 with a platelet count of 104. Differentials are normal.  Coagulation profile showed PT 15.3, INR 1.18, and a PTT of 24.  Basic metabolic panel showed sodium of 141, potassium of 3.9, chloride of 110, bicarb is 24, glucose is 117, BUN is 29, creatinine is 1.05.  IMPRESSION: 1. Lower gastrointestinal bleed,  post colonoscopy plus     polypectomy (questionably secondary to slippage of ligature. 2. Dehydration. 3. Anemia, status post packed RBC transfusion. 4. Thrombocytopenia. 5. History of mitral valve prolapse. 6. History of arthritis.  PLAN:  To admit the patient to step-down.  The patient will be on normal saline IV to go at a rate of 120 cc an hour.  We will start Protonix 80 mg IV stat bolus and subsequently 8mg /hour IV continuous infusion, and then pain will be controlled with Dilaudid 1 mg IV q.4 h. p.r.n. for pain.  DVT prophylaxis will be done with TED stockings.  Further workup to be done on this patient will include cardiac enzymes q.6 h. x3, H and H q.6 hourly.  CBC, CMP, and magnesium will be repeated in a.m. and imaging study to be ordered will include CT of the abdomen and pelvis with IV contrast today.  I was informed that GI physician, Dr. Juanda Chance, has been consulted and evaluation is being waited.  The patient will be followed and evaluated on a day-to-day basis.     Talmage Nap, MD     CN/MEDQ  D:  12/04/2010  T:  12/04/2010  Job:  [id number]  Electronically Signed by Talmage Nap  on 12/04/2010 01:06:05 PM

## 2010-12-05 DIAGNOSIS — D62 Acute posthemorrhagic anemia: Secondary | ICD-10-CM

## 2010-12-05 DIAGNOSIS — K922 Gastrointestinal hemorrhage, unspecified: Secondary | ICD-10-CM

## 2010-12-05 LAB — CBC
HCT: 26.2 % — ABNORMAL LOW (ref 39.0–52.0)
HCT: 28.4 % — ABNORMAL LOW (ref 39.0–52.0)
Hemoglobin: 9 g/dL — ABNORMAL LOW (ref 13.0–17.0)
Hemoglobin: 9.7 g/dL — ABNORMAL LOW (ref 13.0–17.0)
MCH: 30.9 pg (ref 26.0–34.0)
MCH: 30.4 pg (ref 26.0–34.0)
MCHC: 34.4 g/dL (ref 30.0–36.0)
MCHC: 34.2 g/dL (ref 30.0–36.0)
MCV: 90 fL (ref 78.0–100.0)
MCV: 89 fL (ref 78.0–100.0)
Platelets: 122 10*3/uL — ABNORMAL LOW (ref 150–400)
Platelets: 121 10*3/uL — ABNORMAL LOW (ref 150–400)
RBC: 2.91 MIL/uL — ABNORMAL LOW (ref 4.22–5.81)
RBC: 3.19 MIL/uL — ABNORMAL LOW (ref 4.22–5.81)
RDW: 13.6 % (ref 11.5–15.5)
RDW: 13.5 % (ref 11.5–15.5)
WBC: 4 10*3/uL (ref 4.0–10.5)
WBC: 3.9 10*3/uL — ABNORMAL LOW (ref 4.0–10.5)

## 2010-12-05 LAB — CARDIAC PANEL(CRET KIN+CKTOT+MB+TROPI)
CK, MB: 3 ng/mL (ref 0.3–4.0)
CK, MB: 3 ng/mL (ref 0.3–4.0)
Relative Index: 2.2 (ref 0.0–2.5)
Relative Index: 2.1 (ref 0.0–2.5)
Total CK: 137 U/L (ref 7–232)
Total CK: 143 U/L (ref 7–232)
Troponin I: <0.3 ng/mL (ref <0.30)
Troponin I: <0.3 ng/mL (ref <0.30)

## 2010-12-05 LAB — COMPREHENSIVE METABOLIC PANEL
ALT: 8 U/L (ref 0–53)
AST: 16 U/L (ref 0–37)
Albumin: 2.8 g/dL — ABNORMAL LOW (ref 3.5–5.2)
Alkaline Phosphatase: 28 U/L — ABNORMAL LOW (ref 39–117)
BUN: 7 mg/dL (ref 6–23)
CO2: 25 mEq/L (ref 19–32)
Calcium: 7.9 mg/dL — ABNORMAL LOW (ref 8.4–10.5)
Chloride: 113 mEq/L — ABNORMAL HIGH (ref 96–112)
Creatinine, Ser: 0.8 mg/dL (ref 0.50–1.35)
GFR calc Af Amer: >90 mL/min (ref >90)
GFR calc non Af Amer: >90 mL/min (ref >90)
Glucose, Bld: 99 mg/dL (ref 70–99)
Potassium: 3.6 mEq/L (ref 3.5–5.1)
Sodium: 142 mEq/L (ref 135–145)
Total Bilirubin: 0.3 mg/dL (ref 0.3–1.2)
Total Protein: 4.6 g/dL — ABNORMAL LOW (ref 6.0–8.3)

## 2010-12-05 LAB — MAGNESIUM: Magnesium: 1.6 mg/dL (ref 1.5–2.5)

## 2010-12-05 NOTE — Discharge Summary (Signed)
[name], [name] NO.:  [id number]  MEDICAL RECORD NO.:  [id number]  LOCATION:  1509                         FACILITY:  [facility]  PHYSICIAN:  Talmage Nap, MD  DATE OF BIRTH:  December 25, 1950  DATE OF ADMISSION:  12/04/2010 DATE OF DISCHARGE:  12/05/2010                        DISCHARGE SUMMARY - REFERRING   PRIMARY CARE PHYSICIAN:  Unknown.  PRIMARY GASTROENTEROLOGIST:  Dr. Jason Fila at [place].  CONSULTANT INVOLVED IN THE CASE:  GI, Dr. Leone Payor.  DISCHARGE DIAGNOSES:  Lower gastrointestinal bleed, post colonoscopy plus polypectomy, status post colonoscopy.  Findings: A.  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 B.  Moderate diverticulosis in the sigmoid colon. C.  Internal hemorrhoids.  OTHER DISCHARGE DIAGNOSES: 1. Dehydration. 2. Anemia, status post packed RBC transfusion. 3. Thrombocytopenia. 4. History of mitral valve prolapse. 5. History of arthritis.  HISTORY:  The patient is a 60 year old very pleasant Caucasian male, had colonoscopy done by Dr. Jason Fila at [place] 3 days prior to this admission and was said to have done well post procedure.  However, 24 hours prior to being admitted, the patient developed painless rectal bleed, which was said to be associated with dizzy spell, lightheadedness, and chest pain.  He denied any fever.  He denied any chills.  He denied any rigor.  When the patient presented to the emergency room, he was found to be hypotensive and anemic.  He was given IV boluses of normal saline to stabilize his blood pressure and subsequently transfused 1 unit of packed RBCs.  He was evaluated by the GI physician and subsequently, admitted for colonoscopy.  Please for preadmission medications, past medical history, past surgical history, social history as well as family history, allergies, and review of systems, refer to my initial history and physical dictated by  me, Dr. Talmage Nap on December 04, 2010.  PHYSICAL EXAMINATION:  GENERAL:  At the time the patient was seen by me, he is very pleasant with suboptimal hydration, not in any obvious respiratory distress. VITAL SIGNS:  Initial vital signs on presentation was blood pressure 95/51, pulse was 52, respiratory rate 19, and temperature is 96.6. However, following blood transfusion, blood pressure is 104/66, pulse 61, and respiratory rate was 25.  He was pale. HEENT:  Mild pallor, but pupils are reactive to light and extraocular muscles are intact. NECK:  No jugular venous distention.  No carotid bruit.  No lymphadenopathy. CHEST:  Clear to auscultation. HEART:  Heart sounds are 1 and 2. ABDOMEN:  Soft and nontender.  Liver, spleen, and kidney not palpable. Bowel sounds are positive. EXTREMITIES:  No pedal edema. NEUROLOGIC:  Nonfocal. MUSCULOSKELETAL SYSTEM:  Unremarkable. SKIN:  Shows slightly decreased turgor.  LABORATORY DATA:  Initial complete blood count with differential after blood transfusion, WBCs 6.7, hemoglobin 9.4, hematocrit is 27.3, MCV 91.2 with a platelet count of 124.  Basic metabolic panel showed sodium of 141, potassium of 3.9, chloride of 110, bicarbonate is 24, glucose is 117, BUN is 29, and creatinine is 1.05.  Coagulation profile showed PT 15.3, INR 1.18, and APTT of 24.  Three set of cardiac markers unremarkable.  A repeat complete blood count with no  differential prior to discharge showed WBC of 3.9, hemoglobin of 9.7, hematocrit of 28.4, MCV of 89.0, and platelet count is 121.  Comprehensive metabolic panel showed sodium of 142, potassium of 3.6, chloride of 112 with a bicarbonate of 25, glucose is 99, BUN is 7, creatinine is 0.80, and magnesium level is 1.6.  IMAGING STUDIES:  CT of the abdomen and pelvis with contrast, which showed no evidence of bowel obstruction.  There is colon diverticulosis. There is inflammatory change.  No free air.  HOSPITAL  COURSE:  The patient was admitted to general medical floor after being stabilized in the emergency room with IV fluids and blood transfusion and to have hemoglobin and hematocrit done subsequently.  He was started on normal saline IV to go at a rate of 120 cc an hour and given Protonix 40 mg IV stat and subsequently, Protonix 80 mg per hour continuous infusion.  Pain control was done with Dilaudid 1 mg IV q.4 h. p.r.n.  He was evaluated by the gastroenterologist and subsequently, the patient had colonoscopy done and the findings is as described above. Post colonoscopy, the patient was started on clear liquid diet and this was advanced as tolerated and hemoglobin and hematocrit done thereafter had been stable.  The patient was seen by me today.  No hematochezia. No abdominal discomfort.  Examination is essentially unremarkable. Vital signs, blood pressure is 107/68, temperature is 97.7, pulse 61, respiratory rate 18, and medically stable.  Plan is for the patient to be discharged home today on activity as tolerated and regular diet.  He was advised to be off aspirin and Aleve for the next 3 weeks.  Medications to be taken at home will include, 1. Protonix 40 mg 1 p.o. daily. 2. Fish oil 1000 mg 1 p.o. daily. 3. L-lysine 1 tablet p.o. daily. 4. Multivitamins 1 p.o. daily. 5. Osteo Bi-Flex 1 p.o. daily. 6. Vitamin C 500 mg 1 p.o. daily.    Talmage Nap, MD    CN/MEDQ  D:  12/05/2010  T:  12/05/2010  Job:  [id number]  cc:   Primary Care Physician.  Electronically Signed by Talmage Nap  on 12/05/2010 02:42:17 PM

## 2010-12-05 NOTE — H&P (Signed)
  [name], Jacob [name] NO.:  [id number]  MEDICAL RECORD NO.:  [id number]  LOCATION:  1509                         FACILITY:  [facility]  PHYSICIAN:  Talmage Nap, MD  DATE OF BIRTH:  09/07/1950  DATE OF ADMISSION:  12/04/2010 DATE OF DISCHARGE:                             HISTORY & PHYSICAL   ADDENDUM:  In the initial history and physical dictated by me, the patient was referred to as 61 years old very pleasant African American male, this is incorrect.  The patient is a Caucasian male, that is correct.     Talmage Nap, MD     CN/MEDQ  D:  12/05/2010  T:  12/05/2010  Job:  [id number]  Electronically Signed by Talmage Nap  on 12/05/2010 02:01:27 PM

## 2010-12-08 ENCOUNTER — Encounter: Payer: Self-pay | Admitting: Internal Medicine

## 2010-12-08 ENCOUNTER — Telehealth: Payer: Self-pay | Admitting: Internal Medicine

## 2010-12-08 LAB — CROSSMATCH
ABO/RH(D): B POS
Antibody Screen: NEGATIVE
Unit division: 0
Unit division: 0
Unit division: 0

## 2010-12-08 NOTE — Telephone Encounter (Signed)
Patient was seen in the ER by Dr Leone Payor the ER MD provided him a RX of Protonix.  He is wanting to know if he needs to have this rx filled.  He reports no reflux symptoms at all.  I have advised him probably given for GI prophylaxis and he can check with his primary GI MD if he needs to continue.  They thank Dr Leone Payor for his help with his GI bleed.

## 2010-12-10 NOTE — Telephone Encounter (Signed)
No need for this - I am sorry this happened must have been the hospitalist

## 2010-12-11 ENCOUNTER — Encounter: Payer: Self-pay | Admitting: Internal Medicine

## 2010-12-11 ENCOUNTER — Ambulatory Visit (INDEPENDENT_AMBULATORY_CARE_PROVIDER_SITE_OTHER): Payer: BC Managed Care – PPO | Admitting: Internal Medicine

## 2010-12-11 VITALS — BP 122/88 | HR 51 | Temp 96.6°F | Wt 185.0 lb

## 2010-12-11 DIAGNOSIS — D126 Benign neoplasm of colon, unspecified: Secondary | ICD-10-CM

## 2010-12-11 DIAGNOSIS — Z860100 Personal history of colon polyps, unspecified: Secondary | ICD-10-CM

## 2010-12-11 DIAGNOSIS — R55 Syncope and collapse: Secondary | ICD-10-CM

## 2010-12-11 DIAGNOSIS — K635 Polyp of colon: Secondary | ICD-10-CM

## 2010-12-11 DIAGNOSIS — D62 Acute posthemorrhagic anemia: Secondary | ICD-10-CM

## 2010-12-11 HISTORY — DX: Personal history of colon polyps, unspecified: Z86.0100

## 2010-12-11 HISTORY — DX: Acute posthemorrhagic anemia: D62

## 2010-12-11 HISTORY — DX: Personal history of colonic polyps: Z86.010

## 2010-12-11 MED ORDER — NIFEREX-150 150-50-50 MG PO CAPS: 1.0000 | ORAL_CAPSULE | Freq: Every day | ORAL | Status: DC

## 2010-12-11 NOTE — Assessment & Plan Note (Signed)
resolved 

## 2010-12-11 NOTE — Assessment & Plan Note (Signed)
Removed 2 polyps

## 2010-12-11 NOTE — Assessment & Plan Note (Addendum)
10/12 post polypectomy; Dr Jason Fila S/p repeat colon Dr Leone Payor - ulcer post-polypectomy hepatic flexure Start iron

## 2010-12-11 NOTE — Progress Notes (Signed)
  Subjective:    Patient ID: Jacob [name], male    DOB: [birth year], 60 y.o.   MRN: [id number]  HPI  F/u recent postpolypectomy lower GI bleed and anemia. He passed out x 1. C/o fatigue. Overall better now.  Review of Systems  Constitutional: Positive for fatigue. Negative for appetite change and unexpected weight change.  HENT: Negative for nosebleeds, congestion, sore throat, sneezing, trouble swallowing and neck pain.   Eyes: Negative for itching and visual disturbance.  Respiratory: Negative for cough.   Cardiovascular: Negative for chest pain, palpitations and leg swelling.  Gastrointestinal: Negative for nausea, diarrhea, blood in stool and abdominal distention.  Genitourinary: Negative for frequency and hematuria.  Musculoskeletal: Negative for back pain, joint swelling and gait problem.  Skin: Negative for rash.  Neurological: Negative for dizziness, tremors, speech difficulty and weakness.  Psychiatric/Behavioral: Negative for sleep disturbance, dysphoric mood and agitation. The patient is not nervous/anxious.        Objective:   Physical Exam  Constitutional: He is oriented to person, place, and time. He appears well-developed and well-nourished.  HENT:  Mouth/Throat: Oropharynx is clear and moist.  Eyes: Conjunctivae are normal. Pupils are equal, round, and reactive to light.  Neck: Normal range of motion. No JVD present. No thyromegaly present.  Cardiovascular: Normal rate, regular rhythm, normal heart sounds and intact distal pulses.  Exam reveals no gallop and no friction rub.   No murmur heard. Pulmonary/Chest: Effort normal and breath sounds normal. No respiratory distress. He has no wheezes. He has no rales. He exhibits no tenderness.  Abdominal: Soft. Bowel sounds are normal. He exhibits no distension and no mass. There is no tenderness. There is no rebound and no guarding.  Musculoskeletal: Normal range of motion. He exhibits no edema and no tenderness.    Lymphadenopathy:    He has no cervical adenopathy.  Neurological: He is alert and oriented to person, place, and time. He has normal reflexes. No cranial nerve deficit. He exhibits normal muscle tone. Coordination normal.  Skin: Skin is warm and dry. No rash noted. There is pallor.  Psychiatric: He has a normal mood and affect. His behavior is normal. Judgment and thought content normal.    Hosp records, labs were reviewed      Assessment & Plan:

## 2010-12-18 ENCOUNTER — Telehealth: Payer: Self-pay | Admitting: *Deleted

## 2010-12-18 NOTE — Telephone Encounter (Signed)
Pt calls stating he has a lump where IV was inserted while in hospital in October.  Area is still sore and swollen.  Appt made to see Dr. Posey Rea on Wednesday.  Offered appt with another provider, but pt declined.

## 2010-12-20 ENCOUNTER — Encounter: Payer: Self-pay | Admitting: Internal Medicine

## 2010-12-20 ENCOUNTER — Ambulatory Visit (INDEPENDENT_AMBULATORY_CARE_PROVIDER_SITE_OTHER): Payer: BC Managed Care – PPO | Admitting: Internal Medicine

## 2010-12-20 VITALS — BP 118/80 | HR 76 | Temp 97.9°F | Resp 16 | Wt 185.0 lb

## 2010-12-20 DIAGNOSIS — M7989 Other specified soft tissue disorders: Secondary | ICD-10-CM

## 2010-12-20 DIAGNOSIS — I808 Phlebitis and thrombophlebitis of other sites: Secondary | ICD-10-CM | POA: Insufficient documentation

## 2010-12-20 DIAGNOSIS — M79609 Pain in unspecified limb: Secondary | ICD-10-CM

## 2010-12-20 MED ORDER — DICLOFENAC SODIUM 1.5 % TD SOLN: 3.0000 [drp] | Freq: Four times a day (QID) | TRANSDERMAL | Status: DC | PRN

## 2010-12-20 NOTE — Assessment & Plan Note (Signed)
10/12 R basilic vein - post-IV (x3 wks)  Pennsaid ACE wrap Compresses If not better in 2-3 d - start ASA EC 81 mg a day w/caution

## 2010-12-20 NOTE — Progress Notes (Signed)
  Subjective:    Patient ID: Jacob [name], male    DOB: 11-Oct-1950, 60 y.o.   MRN: [id number]  HPI C/o R ulnar forearm vein pain and swelling since his last colonoscopy 3 wks ago.   Review of Systems  Constitutional: Negative for fever, chills and fatigue.  Respiratory: Negative for chest tightness and shortness of breath.   Cardiovascular: Negative for chest pain and leg swelling.  Musculoskeletal: Negative for back pain.  Psychiatric/Behavioral: The patient is not nervous/anxious.        Objective:   Physical Exam  Constitutional: He appears well-developed.  Cardiovascular: Normal rate.   No murmur heard.      r prox ulnar forearm w/tender eryth swelling and palpable sq band of 12 cm  Pulmonary/Chest: No respiratory distress.  Abdominal: He exhibits no distension. There is no tenderness. There is no rebound.         Procedure Note :    Procedure :   Point of care (POC) sonography examination   Indication: R forearm focal swelling   Equipment used: Sonosite M-Turbo with HFL38x/13-6 MHz transducer linear probe. The images were stored in the unit and later transferred in storage.  The patient was placed in a decubitus position.  This study revealed a hypoechoic  Subcutaneous veins in the prox ulnar region of his  R forearm, non compressible and w/o any doppler flow  12  cm  In length.   Impression: findings c/w superficial phlebitis in R basilic vein.    Assessment & Plan:

## 2010-12-20 NOTE — Patient Instructions (Signed)
Warm compress ACE wrap

## 2010-12-27 ENCOUNTER — Other Ambulatory Visit: Payer: Self-pay | Admitting: Cardiovascular Disease

## 2010-12-27 ENCOUNTER — Encounter: Payer: Self-pay | Admitting: *Deleted

## 2010-12-27 DIAGNOSIS — D62 Acute posthemorrhagic anemia: Secondary | ICD-10-CM

## 2011-01-01 ENCOUNTER — Ambulatory Visit (INDEPENDENT_AMBULATORY_CARE_PROVIDER_SITE_OTHER): Payer: BC Managed Care – PPO | Admitting: Cardiovascular Disease

## 2011-01-01 ENCOUNTER — Ambulatory Visit (INDEPENDENT_AMBULATORY_CARE_PROVIDER_SITE_OTHER): Payer: BC Managed Care – PPO | Admitting: *Deleted

## 2011-01-01 ENCOUNTER — Encounter: Payer: Self-pay | Admitting: Cardiovascular Disease

## 2011-01-01 DIAGNOSIS — R5383 Other fatigue: Secondary | ICD-10-CM

## 2011-01-01 DIAGNOSIS — R0602 Shortness of breath: Secondary | ICD-10-CM

## 2011-01-01 DIAGNOSIS — D62 Acute posthemorrhagic anemia: Secondary | ICD-10-CM

## 2011-01-01 DIAGNOSIS — R5381 Other malaise: Secondary | ICD-10-CM

## 2011-01-01 DIAGNOSIS — R079 Chest pain, unspecified: Secondary | ICD-10-CM

## 2011-01-01 DIAGNOSIS — R0789 Other chest pain: Secondary | ICD-10-CM

## 2011-01-01 NOTE — Patient Instructions (Signed)
We will draw CBC today to evaluate your hematocrit. We will call with results tomorrow. Dr. Elease Hashimoto recommends you have hematocrit checked monthly by your primary physician.  Your physician recommends that you follow up as needed. [phone]

## 2011-01-01 NOTE — Assessment & Plan Note (Signed)
Trinna Post presents today for further evaluation of an episode of chest pain/ pressure that occurred in the middle of a GI bleed. It was associated with significant anemia. His symptoms improved after he received one unit of packed red blood cells.  He was hospitalized and observed overnight. He ruled out for myocardial infarction. He's not had any further episodes of chest discomfort.  He's been out cycling without any significant chest pain or pressure.  We'll check a CBC today. I like to perform a stress echocardiogram but I would like to wait until his hematocrit is normal.  I don't think that a stress echo performed while he still anemic is going to be as useful as he would like.  We will have him check his hematocrit at his medical doctor's office every month until is normal. I anticipate doing a stress echocardiogram in approximately 3 months. I'll see him again on as-needed basis. He's to call me if he has any further problems.

## 2011-01-01 NOTE — Progress Notes (Signed)
Jacob [name] Date of Birth  1950-10-11 [facility] [address]    [address] [place], [place]  [address] [phone]  Fax  [phone]  History of Present Illness:  Jacob [name] is a 60 y.o. gentleman who is a cycling  friend of mine.  He is seen to day for an episode of chest pain that occurred after he had a GI bleed following polypectmy.  Jacob [name] has been in his usual stage of health.  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  He had laproscopic removal of the appendix and had no problems.   A followup colonoscopy in October revealed 2 polyps. He had polypectomy. This was complicated by a significant GI bleed with significant anemia 2 days following the polypectomy. His hemoglobin reached a nadir of 8.7.  During that time,  he had chest pressure while he was in the emergency room.  The chest pressure resolved after he started receiving the transfusion.  He received a total of 1 unit of packed red blood cells (O-).   He felt better after the transfusion and he did not get any any further transfusions.  Since that time he's had generalized fatigue. He's not had any similar episodes of chest pain. He denies any unexplained diaphoresis. He denies any syncope or presyncope.   Current Outpatient Prescriptions on File Prior to Visit  Medication Sig Dispense Refill  . Ascorbic Acid (VITAMIN C) 100 MG tablet Take 200 mg by mouth daily.        Marland Kitchen aspirin 81 MG tablet Take 81 mg by mouth daily.        Marland Kitchen Fe-Succ Ac-C-Thre Ac (NIFEREX-150) 150-50-50 MG CAPS Take 1 capsule by mouth daily.  30 each  3  . Glucosamine HCl-Glucosamin SO4 (GLUCOSAMINE COMPLEX PO) Take 1 each by mouth 2 (two) times daily.        . Multiple Vitamins-Minerals (MULTIVITAL PO) Take 1 each by mouth daily.        . Omega-3 Fatty Acids (FISH OIL) 1000 MG CAPS Take 1 capsule by mouth 2 (two) times daily.          Not on File  Past Medical History  Diagnosis Date  .  Eczema     hot tub related  . Herpes zoster 2010  . GI bleed     s/p polypectomy    Past Surgical History  Procedure Date  . Rotator cuff repair 2003    right  . Appendectomy 05/2010    History  Smoking status  . Current Some Day Smoker  . Types: Cigars  Smokeless tobacco  . Not on file    History  Alcohol Use  . Yes    Family History  Problem Relation Age of Onset  . Heart disease Mother     pacemaker    Reviw of Systems:  Reviewed in the HPI.  All other systems are negative.  Physical Exam: BP 120/72  Pulse 55  Ht 6' (1.829 m)  Wt 188 lb 4 oz (85.39 kg)  BMI 25.53 kg/m2 The patient is alert and oriented x 3.  The mood and affect are normal.   Skin: warm and dry.  Color is normal.    HEENT:   His neck is supple. His mucous membranes are moist. There is no JVD. His carotids are normal.  Lungs: Lung exam is clear.   Heart: Regular S1-S2. No murmurs.    Abdomen: His  abdominal exam is benign. Good bowel sounds. There is no hepatosplenomegaly.  Extremities:  No clubbing cyanosis or edema.  Neuro:  Exam is nonfocal. His gait is normal to    ECG: EKG reveals normal sinus rhythm. He has no ST or T wave changes.  Assessment / Plan:

## 2011-01-02 LAB — CBC WITH DIFFERENTIAL
Basophils Absolute: 0 10*3/uL (ref 0.0–0.2)
Basos: 1 % (ref 0–3)
Eos: 1 % (ref 0–7)
Eosinophils Absolute: 0.1 10*3/uL (ref 0.0–0.4)
HCT: 39.2 % (ref 37.5–51.0)
Hemoglobin: 13.3 g/dL (ref 12.6–17.7)
Immature Grans (Abs): 0 10*3/uL (ref 0.0–0.1)
Immature Granulocytes: 0 % (ref 0–2)
Lymphocytes Absolute: 2.4 10*3/uL (ref 0.7–4.5)
Lymphs: 44 % (ref 14–46)
MCH: 30.1 pg (ref 26.6–33.0)
MCHC: 33.9 g/dL (ref 31.5–35.7)
MCV: 89 fL (ref 79–97)
Monocytes Absolute: 0.6 10*3/uL (ref 0.1–1.0)
Monocytes: 11 % (ref 4–13)
Neutrophils Absolute: 2.3 10*3/uL (ref 1.8–7.8)
Neutrophils Relative %: 43 % (ref 40–74)
Platelets: 196 10*3/uL (ref 140–415)
RBC: 4.42 x10E6/uL (ref 4.14–5.80)
RDW: 13.6 % (ref 12.3–15.4)
WBC: 5.4 10*3/uL (ref 4.0–10.5)

## 2011-03-07 ENCOUNTER — Other Ambulatory Visit (INDEPENDENT_AMBULATORY_CARE_PROVIDER_SITE_OTHER): Payer: BC Managed Care – PPO

## 2011-03-07 DIAGNOSIS — D62 Acute posthemorrhagic anemia: Secondary | ICD-10-CM

## 2011-03-07 LAB — CBC WITH DIFFERENTIAL/PLATELET
Basophils Absolute: 0 10*3/uL (ref 0.0–0.1)
Basophils Relative: 0.7 % (ref 0.0–3.0)
Eosinophils Absolute: 0.1 10*3/uL (ref 0.0–0.7)
Eosinophils Relative: 1.9 % (ref 0.0–5.0)
HCT: 46.2 % (ref 39.0–52.0)
Hemoglobin: 15.7 g/dL (ref 13.0–17.0)
Lymphocytes Relative: 41.2 % (ref 12.0–46.0)
Lymphs Abs: 2.1 10*3/uL (ref 0.7–4.0)
MCHC: 34 g/dL (ref 30.0–36.0)
MCV: 90.8 fl (ref 78.0–100.0)
Monocytes Absolute: 0.7 10*3/uL (ref 0.1–1.0)
Monocytes Relative: 12.9 % — ABNORMAL HIGH (ref 3.0–12.0)
Neutro Abs: 2.2 10*3/uL (ref 1.4–7.7)
Neutrophils Relative %: 43.3 % (ref 43.0–77.0)
Platelets: 184 10*3/uL (ref 150.0–400.0)
RBC: 5.09 Mil/uL (ref 4.22–5.81)
RDW: 13.1 % (ref 11.5–14.6)
WBC: 5.1 10*3/uL (ref 4.5–10.5)

## 2011-03-07 LAB — IBC PANEL
Iron: 63 ug/dL (ref 42–165)
Saturation Ratios: 21.7 % (ref 20.0–50.0)
Transferrin: 207.2 mg/dL — ABNORMAL LOW (ref 212.0–360.0)

## 2011-03-14 ENCOUNTER — Encounter: Payer: Self-pay | Admitting: Internal Medicine

## 2011-03-14 ENCOUNTER — Ambulatory Visit (INDEPENDENT_AMBULATORY_CARE_PROVIDER_SITE_OTHER): Payer: BC Managed Care – PPO | Admitting: Internal Medicine

## 2011-03-14 VITALS — BP 108/80 | HR 68 | Temp 97.2°F | Resp 16 | Wt 190.0 lb

## 2011-03-14 DIAGNOSIS — D62 Acute posthemorrhagic anemia: Secondary | ICD-10-CM

## 2011-03-14 DIAGNOSIS — I808 Phlebitis and thrombophlebitis of other sites: Secondary | ICD-10-CM

## 2011-03-14 DIAGNOSIS — Z23 Encounter for immunization: Secondary | ICD-10-CM

## 2011-03-14 NOTE — Assessment & Plan Note (Signed)
10/12 post polypectomy lower GI bleed; Dr Jason Fila Cont iron x 6 more wks

## 2011-03-14 NOTE — Progress Notes (Signed)
  Subjective:    Patient ID: Jacob [name], male    DOB: Mar 22, 1950, 61 y.o.   MRN: [id number]  HPI  F/u anemia, phlebitis  Review of Systems  Constitutional: Negative for fatigue.  Respiratory: Negative for shortness of breath.   Gastrointestinal: Negative for blood in stool.  Skin: Negative for pallor.       Objective:   Physical Exam  Constitutional: He is oriented to person, place, and time. He appears well-developed.  HENT:  Mouth/Throat: Oropharynx is clear and moist.  Eyes: Conjunctivae are normal. Pupils are equal, round, and reactive to light.  Neck: Normal range of motion. No JVD present. No thyromegaly present.  Cardiovascular: Normal rate, regular rhythm, normal heart sounds and intact distal pulses.  Exam reveals no gallop and no friction rub.   No murmur heard. Pulmonary/Chest: Effort normal and breath sounds normal. No respiratory distress. He has no wheezes. He has no rales. He exhibits no tenderness.  Abdominal: Soft. Bowel sounds are normal. He exhibits no distension and no mass. There is no tenderness. There is no rebound and no guarding.  Musculoskeletal: Normal range of motion. He exhibits no edema and no tenderness.  Lymphadenopathy:    He has no cervical adenopathy.  Neurological: He is alert and oriented to person, place, and time. He has normal reflexes. No cranial nerve deficit. He exhibits normal muscle tone. Coordination normal.  Skin: Skin is warm and dry. No rash noted.  Psychiatric: He has a normal mood and affect. His behavior is normal. Judgment and thought content normal.   Lab Results  Component Value Date   WBC 5.1 03/07/2011   HGB 15.7 03/07/2011   HCT 46.2 03/07/2011   PLT 184.0 03/07/2011   GLUCOSE 99 12/05/2010   CHOL 203* 01/04/2009   TRIG 106.0 01/04/2009   HDL 48.40 01/04/2009   LDLDIRECT 134.2 01/04/2009   LDLCALC 90 03/27/2007   ALT 8 12/05/2010   AST 16 12/05/2010   NA 142 12/05/2010   K 3.6 12/05/2010   CL 113* 12/05/2010   CREATININE 0.80 12/05/2010   BUN 7 12/05/2010   CO2 25 12/05/2010   TSH 2.85 01/04/2009   PSA 0.64 01/04/2009   INR 1.18 12/04/2010          Assessment & Plan:

## 2011-03-14 NOTE — Patient Instructions (Signed)
Take iron x 6 more weeks

## 2011-03-14 NOTE — Assessment & Plan Note (Signed)
resolved 

## 2011-09-03 ENCOUNTER — Telehealth: Payer: Self-pay | Admitting: *Deleted

## 2011-09-03 DIAGNOSIS — Z0389 Encounter for observation for other suspected diseases and conditions ruled out: Secondary | ICD-10-CM

## 2011-09-03 DIAGNOSIS — Z0000 Encounter for general adult medical examination without abnormal findings: Secondary | ICD-10-CM

## 2011-09-03 NOTE — Telephone Encounter (Signed)
Message copied by Merrilyn Puma on Mon Sep 03, 2011  9:50 AM ------      Message from: Newell Coral      Created: Mon Aug 13, 2011  1:48 PM       cpe sch, needs labs ,thanks!

## 2011-09-03 NOTE — Telephone Encounter (Signed)
10/30/11 CPE labs entered.

## 2011-09-12 ENCOUNTER — Ambulatory Visit: Payer: BC Managed Care – PPO | Admitting: Internal Medicine

## 2011-10-24 ENCOUNTER — Other Ambulatory Visit (INDEPENDENT_AMBULATORY_CARE_PROVIDER_SITE_OTHER): Payer: BC Managed Care – PPO

## 2011-10-24 DIAGNOSIS — Z0389 Encounter for observation for other suspected diseases and conditions ruled out: Secondary | ICD-10-CM

## 2011-10-24 DIAGNOSIS — Z0000 Encounter for general adult medical examination without abnormal findings: Secondary | ICD-10-CM

## 2011-10-24 DIAGNOSIS — Z13220 Encounter for screening for lipoid disorders: Secondary | ICD-10-CM

## 2011-10-24 LAB — BASIC METABOLIC PANEL
BUN: 18 mg/dL (ref 6–23)
CO2: 27 mEq/L (ref 19–32)
Calcium: 9.4 mg/dL (ref 8.4–10.5)
Chloride: 105 mEq/L (ref 96–112)
Creatinine, Ser: 1 mg/dL (ref 0.4–1.5)
GFR: 83.45 mL/min (ref >60.00)
Glucose, Bld: 97 mg/dL (ref 70–99)
Potassium: 4.7 mEq/L (ref 3.5–5.1)
Sodium: 140 mEq/L (ref 135–145)

## 2011-10-24 LAB — LIPID PANEL
Cholesterol: 203 mg/dL — ABNORMAL HIGH (ref 0–200)
HDL: 59.1 mg/dL (ref >39.00)
Total CHOL/HDL Ratio: 3
Triglycerides: 78 mg/dL (ref 0.0–149.0)
VLDL: 15.6 mg/dL (ref 0.0–40.0)

## 2011-10-24 LAB — URINALYSIS, ROUTINE W REFLEX MICROSCOPIC
Bilirubin Urine: NEGATIVE
Hgb urine dipstick: NEGATIVE
Ketones, ur: NEGATIVE
Leukocytes, UA: NEGATIVE
Nitrite: NEGATIVE
Specific Gravity, Urine: 1.025 (ref 1.000–1.030)
Total Protein, Urine: NEGATIVE
Urine Glucose: NEGATIVE
Urobilinogen, UA: 0.2 (ref 0.0–1.0)
pH: 6 (ref 5.0–8.0)

## 2011-10-24 LAB — CBC WITH DIFFERENTIAL/PLATELET
Basophils Absolute: 0 10*3/uL (ref 0.0–0.1)
Basophils Relative: 0.5 % (ref 0.0–3.0)
Eosinophils Absolute: 0.1 10*3/uL (ref 0.0–0.7)
Eosinophils Relative: 2.4 % (ref 0.0–5.0)
HCT: 43.7 % (ref 39.0–52.0)
Hemoglobin: 14.5 g/dL (ref 13.0–17.0)
Lymphocytes Relative: 42.3 % (ref 12.0–46.0)
Lymphs Abs: 2.3 10*3/uL (ref 0.7–4.0)
MCHC: 33.2 g/dL (ref 30.0–36.0)
MCV: 93.3 fl (ref 78.0–100.0)
Monocytes Absolute: 0.6 10*3/uL (ref 0.1–1.0)
Monocytes Relative: 10.1 % (ref 3.0–12.0)
Neutro Abs: 2.4 10*3/uL (ref 1.4–7.7)
Neutrophils Relative %: 44.7 % (ref 43.0–77.0)
Platelets: 187 10*3/uL (ref 150.0–400.0)
RBC: 4.68 Mil/uL (ref 4.22–5.81)
RDW: 13.6 % (ref 11.5–14.6)
WBC: 5.5 10*3/uL (ref 4.5–10.5)

## 2011-10-24 LAB — HEPATIC FUNCTION PANEL
ALT: 11 U/L (ref 0–53)
AST: 18 U/L (ref 0–37)
Albumin: 4.5 g/dL (ref 3.5–5.2)
Alkaline Phosphatase: 40 U/L (ref 39–117)
Bilirubin, Direct: 0.1 mg/dL (ref 0.0–0.3)
Total Bilirubin: 0.7 mg/dL (ref 0.3–1.2)
Total Protein: 6.8 g/dL (ref 6.0–8.3)

## 2011-10-24 LAB — PSA: PSA: 1.22 ng/mL (ref 0.10–4.00)

## 2011-10-24 LAB — TSH: TSH: 3.25 u[IU]/mL (ref 0.35–5.50)

## 2011-10-25 LAB — LDL CHOLESTEROL, DIRECT: Direct LDL: 126.2 mg/dL

## 2011-10-30 ENCOUNTER — Encounter: Payer: Self-pay | Admitting: Internal Medicine

## 2011-10-30 ENCOUNTER — Ambulatory Visit (INDEPENDENT_AMBULATORY_CARE_PROVIDER_SITE_OTHER): Payer: BC Managed Care – PPO | Admitting: Internal Medicine

## 2011-10-30 VITALS — BP 118/80 | HR 68 | Temp 98.1°F | Resp 16 | Ht 71.5 in | Wt 184.0 lb

## 2011-10-30 DIAGNOSIS — R5383 Other fatigue: Secondary | ICD-10-CM

## 2011-10-30 DIAGNOSIS — M545 Low back pain, unspecified: Secondary | ICD-10-CM

## 2011-10-30 DIAGNOSIS — Z23 Encounter for immunization: Secondary | ICD-10-CM

## 2011-10-30 DIAGNOSIS — L570 Actinic keratosis: Secondary | ICD-10-CM

## 2011-10-30 DIAGNOSIS — Z0000 Encounter for general adult medical examination without abnormal findings: Secondary | ICD-10-CM | POA: Insufficient documentation

## 2011-10-30 NOTE — Assessment & Plan Note (Signed)
We discussed age appropriate health related issues, including available/recomended screening tests and vaccinations. We discussed a need for adhering to healthy diet and exercise. Labs/EKG were reviewed/ordered. All questions were answered.   

## 2011-10-30 NOTE — Progress Notes (Signed)
Subjective:    Patient ID: Jacob [name], male    DOB: April 02, 1950, 61 y.o.   MRN: [id number]  HPI  The patient is here for a wellness exam. The patient has been doing well overall without major physical or psychological issues going on lately. He is tired in pm's sometimes x 6 mo  F/u anemia, phlebitis  Wt Readings from Last 3 Encounters:  10/30/11 184 lb (83.462 kg)  03/14/11 190 lb (86.183 kg)  01/01/11 188 lb 4 oz (85.39 kg)   BP Readings from Last 3 Encounters:  10/30/11 118/80  03/14/11 108/80  01/01/11 120/72     Review of Systems  Constitutional: Positive for fatigue. Negative for appetite change and unexpected weight change.  HENT: Negative for nosebleeds, congestion, sore throat, sneezing, trouble swallowing and neck pain.   Eyes: Negative for itching and visual disturbance.  Respiratory: Negative for apnea, cough, shortness of breath and wheezing.   Cardiovascular: Negative for chest pain, palpitations and leg swelling.  Gastrointestinal: Negative for nausea, diarrhea, blood in stool and abdominal distention.  Genitourinary: Negative for frequency and hematuria.  Musculoskeletal: Negative for back pain, joint swelling and gait problem.  Skin: Negative for pallor and rash.  Neurological: Negative for dizziness, tremors, speech difficulty and weakness.  Psychiatric/Behavioral: Negative for disturbed wake/sleep cycle, dysphoric mood and agitation. The patient is not nervous/anxious.        Objective:   Physical Exam  Constitutional: He is oriented to person, place, and time. He appears well-developed and well-nourished.  HENT:  Mouth/Throat: Oropharynx is clear and moist.  Eyes: Conjunctivae normal are normal. Pupils are equal, round, and reactive to light.  Neck: Normal range of motion. No JVD present. No thyromegaly present.  Cardiovascular: Normal rate, regular rhythm, normal heart sounds and intact distal pulses.  Exam reveals no gallop and no friction rub.     No murmur heard. Pulmonary/Chest: Effort normal and breath sounds normal. No respiratory distress. He has no wheezes. He has no rales. He exhibits no tenderness.  Abdominal: Soft. Bowel sounds are normal. He exhibits no distension and no mass. There is no tenderness. There is no rebound and no guarding.  Genitourinary: Rectum normal and prostate normal. Guaiac negative stool.  Musculoskeletal: Normal range of motion. He exhibits no edema and no tenderness.  Lymphadenopathy:    He has no cervical adenopathy.  Neurological: He is alert and oriented to person, place, and time. He has normal reflexes. No cranial nerve deficit. He exhibits normal muscle tone. Coordination normal.  Skin: Skin is warm and dry. No rash noted.       AKs on scalp  Psychiatric: He has a normal mood and affect. His behavior is normal. Judgment and thought content normal.  AKs on scalp  Lab Results  Component Value Date   WBC 5.5 10/24/2011   HGB 14.5 10/24/2011   HCT 43.7 10/24/2011   PLT 187.0 10/24/2011   GLUCOSE 97 10/24/2011   CHOL 203* 10/24/2011   TRIG 78.0 10/24/2011   HDL 59.10 10/24/2011   LDLDIRECT 126.2 10/24/2011   LDLCALC 90 03/27/2007   ALT 11 10/24/2011   AST 18 10/24/2011   NA 140 10/24/2011   K 4.7 10/24/2011   CL 105 10/24/2011   CREATININE 1.0 10/24/2011   BUN 18 10/24/2011   CO2 27 10/24/2011   TSH 3.25 10/24/2011   PSA 1.22 10/24/2011   INR 1.18 12/04/2010     Procedure Note :     Procedure : Cryosurgery  Indication:    Actinic keratosis(es)   Risks including unsuccessful procedure , bleeding, infection, bruising, scar, a need for a repeat  procedure and others were explained to the patient in detail as well as the benefits. Informed consent was obtained verbally.    6 lesion(s)  on scalp   was/were treated with liquid nitrogen on a Q-tip in a usual fasion . Band-Aid was applied and antibiotic ointment was given for a later use.   Tolerated well. Complications none.   Postprocedure  instructions :     Keep the wounds clean. You can wash them with liquid soap and water. Pat dry with gauze or a Kleenex tissue  Before applying antibiotic ointment        Assessment & Plan:

## 2011-10-31 DIAGNOSIS — R5383 Other fatigue: Secondary | ICD-10-CM | POA: Insufficient documentation

## 2011-10-31 DIAGNOSIS — L570 Actinic keratosis: Secondary | ICD-10-CM | POA: Insufficient documentation

## 2011-10-31 NOTE — Assessment & Plan Note (Signed)
See procedue 

## 2011-10-31 NOTE — Assessment & Plan Note (Signed)
Resolved

## 2011-10-31 NOTE — Assessment & Plan Note (Signed)
Occasional ?etiology x 6 mo Will watch. Denies OSA sx's. Will check testost, B12 levels if not better etc

## 2013-04-28 ENCOUNTER — Encounter: Payer: Self-pay | Admitting: Internal Medicine

## 2013-04-28 ENCOUNTER — Ambulatory Visit (INDEPENDENT_AMBULATORY_CARE_PROVIDER_SITE_OTHER): Payer: BC Managed Care – PPO | Admitting: Internal Medicine

## 2013-04-28 VITALS — BP 134/80 | HR 68 | Temp 98.1°F | Ht 71.0 in | Wt 197.0 lb

## 2013-04-28 DIAGNOSIS — Z0000 Encounter for general adult medical examination without abnormal findings: Secondary | ICD-10-CM

## 2013-04-28 DIAGNOSIS — Z2911 Encounter for prophylactic immunotherapy for respiratory syncytial virus (RSV): Secondary | ICD-10-CM

## 2013-04-28 DIAGNOSIS — Z23 Encounter for immunization: Secondary | ICD-10-CM

## 2013-04-28 MED ORDER — VITAMIN D 1000 UNITS PO TABS: 1000.0000 [IU] | ORAL_TABLET | Freq: Every day | ORAL | Status: DC

## 2013-04-28 NOTE — Assessment & Plan Note (Signed)
We discussed age appropriate health related issues, including available/recomended screening tests and vaccinations. We discussed a need for adhering to healthy diet and exercise. Labs/EKG were reviewed/ordered. All questions were answered. Labs Zostavax

## 2013-04-28 NOTE — Progress Notes (Signed)
Pre visit review using our clinic review tool, if applicable. No additional management support is needed unless otherwise documented below in the visit note. 

## 2013-04-28 NOTE — Progress Notes (Signed)
   Subjective:    HPI  The patient is here for a wellness exam.   The patient has been doing well overall without major physical or psychological issues going on lately.   F/u anemia, phlebitis - remote  Wt Readings from Last 3 Encounters:  04/28/13 197 lb (89.359 kg)  10/30/11 184 lb (83.462 kg)  03/14/11 190 lb (86.183 kg)   BP Readings from Last 3 Encounters:  04/28/13 134/80  10/30/11 118/80  03/14/11 108/80     Review of Systems  Constitutional: Negative for chills, appetite change, fatigue and unexpected weight change.  HENT: Negative for congestion, nosebleeds, sneezing, sore throat and trouble swallowing.   Eyes: Negative for itching and visual disturbance.  Respiratory: Negative for apnea, cough, shortness of breath and wheezing.   Cardiovascular: Negative for chest pain, palpitations and leg swelling.  Gastrointestinal: Negative for nausea, diarrhea, blood in stool and abdominal distention.  Genitourinary: Negative for urgency, frequency, hematuria, flank pain, decreased urine volume, scrotal swelling and testicular pain.  Musculoskeletal: Positive for arthralgias. Negative for back pain, gait problem, joint swelling, myalgias and neck pain.  Skin: Negative for pallor and rash.  Neurological: Negative for dizziness, tremors, speech difficulty, weakness, numbness and headaches.  Psychiatric/Behavioral: Negative for suicidal ideas, sleep disturbance, dysphoric mood and agitation. The patient is not nervous/anxious.        Objective:   Physical Exam  Constitutional: He is oriented to person, place, and time. He appears well-developed and well-nourished.  HENT:  Mouth/Throat: Oropharynx is clear and moist.  Eyes: Conjunctivae are normal. Pupils are equal, round, and reactive to light.  Neck: Normal range of motion. No JVD present. No thyromegaly present.  Cardiovascular: Normal rate, regular rhythm, normal heart sounds and intact distal pulses.  Exam reveals no  gallop and no friction rub.   No murmur heard. Pulmonary/Chest: Effort normal and breath sounds normal. No respiratory distress. He has no wheezes. He has no rales. He exhibits no tenderness.  Abdominal: Soft. Bowel sounds are normal. He exhibits no distension and no mass. There is no tenderness. There is no rebound and no guarding.  Genitourinary: Rectum normal and prostate normal. Guaiac negative stool.  Musculoskeletal: Normal range of motion. He exhibits no edema and no tenderness.  Lymphadenopathy:    He has no cervical adenopathy.  Neurological: He is alert and oriented to person, place, and time. He has normal reflexes. No cranial nerve deficit. He exhibits normal muscle tone. Coordination normal.  Skin: Skin is warm and dry. No rash noted.  Psychiatric: He has a normal mood and affect. His behavior is normal. Judgment and thought content normal.    Lab Results  Component Value Date   WBC 5.5 10/24/2011   HGB 14.5 10/24/2011   HCT 43.7 10/24/2011   PLT 187.0 10/24/2011   GLUCOSE 97 10/24/2011   CHOL 203* 10/24/2011   TRIG 78.0 10/24/2011   HDL 59.10 10/24/2011   LDLDIRECT 126.2 10/24/2011   LDLCALC 90 03/27/2007   ALT 11 10/24/2011   AST 18 10/24/2011   NA 140 10/24/2011   K 4.7 10/24/2011   CL 105 10/24/2011   CREATININE 1.0 10/24/2011   BUN 18 10/24/2011   CO2 27 10/24/2011   TSH 3.25 10/24/2011   PSA 1.22 10/24/2011   INR 1.18 12/04/2010          Assessment & Plan:

## 2013-04-29 ENCOUNTER — Other Ambulatory Visit (INDEPENDENT_AMBULATORY_CARE_PROVIDER_SITE_OTHER): Payer: BC Managed Care – PPO

## 2013-04-29 DIAGNOSIS — Z0000 Encounter for general adult medical examination without abnormal findings: Secondary | ICD-10-CM

## 2013-04-29 LAB — URINALYSIS
Bilirubin Urine: NEGATIVE
Hgb urine dipstick: NEGATIVE
Ketones, ur: NEGATIVE
Leukocytes, UA: NEGATIVE
Nitrite: NEGATIVE
Specific Gravity, Urine: >=1.03 — AB (ref 1.000–1.030)
Total Protein, Urine: NEGATIVE
Urine Glucose: NEGATIVE
Urobilinogen, UA: 0.2 (ref 0.0–1.0)
pH: 5.5 (ref 5.0–8.0)

## 2013-04-29 LAB — LIPID PANEL
Cholesterol: 213 mg/dL — ABNORMAL HIGH (ref 0–200)
HDL: 54.9 mg/dL (ref >39.00)
LDL Cholesterol: 142 mg/dL — ABNORMAL HIGH (ref 0–99)
Total CHOL/HDL Ratio: 4
Triglycerides: 83 mg/dL (ref 0.0–149.0)
VLDL: 16.6 mg/dL (ref 0.0–40.0)

## 2013-04-29 LAB — BASIC METABOLIC PANEL
BUN: 14 mg/dL (ref 6–23)
CO2: 27 mEq/L (ref 19–32)
Calcium: 9.4 mg/dL (ref 8.4–10.5)
Chloride: 103 mEq/L (ref 96–112)
Creatinine, Ser: 1 mg/dL (ref 0.4–1.5)
GFR: 80.17 mL/min (ref >60.00)
Glucose, Bld: 110 mg/dL — ABNORMAL HIGH (ref 70–99)
Potassium: 4.4 mEq/L (ref 3.5–5.1)
Sodium: 137 mEq/L (ref 135–145)

## 2013-04-29 LAB — CBC WITH DIFFERENTIAL/PLATELET
Basophils Absolute: 0 10*3/uL (ref 0.0–0.1)
Basophils Relative: 0.6 % (ref 0.0–3.0)
Eosinophils Absolute: 0.1 10*3/uL (ref 0.0–0.7)
Eosinophils Relative: 2.6 % (ref 0.0–5.0)
HCT: 46.6 % (ref 39.0–52.0)
Hemoglobin: 15.8 g/dL (ref 13.0–17.0)
Lymphocytes Relative: 38.6 % (ref 12.0–46.0)
Lymphs Abs: 2.1 10*3/uL (ref 0.7–4.0)
MCHC: 34 g/dL (ref 30.0–36.0)
MCV: 91.2 fl (ref 78.0–100.0)
Monocytes Absolute: 0.6 10*3/uL (ref 0.1–1.0)
Monocytes Relative: 11 % (ref 3.0–12.0)
Neutro Abs: 2.6 10*3/uL (ref 1.4–7.7)
Neutrophils Relative %: 47.2 % (ref 43.0–77.0)
Platelets: 184 10*3/uL (ref 150.0–400.0)
RBC: 5.11 Mil/uL (ref 4.22–5.81)
RDW: 13.4 % (ref 11.5–14.6)
WBC: 5.5 10*3/uL (ref 4.5–10.5)

## 2013-04-29 LAB — HEPATIC FUNCTION PANEL
ALT: 16 U/L (ref 0–53)
AST: 23 U/L (ref 0–37)
Albumin: 4.5 g/dL (ref 3.5–5.2)
Alkaline Phosphatase: 40 U/L (ref 39–117)
Bilirubin, Direct: 0.1 mg/dL (ref 0.0–0.3)
Total Bilirubin: 0.9 mg/dL (ref 0.3–1.2)
Total Protein: 6.9 g/dL (ref 6.0–8.3)

## 2013-04-29 LAB — HEMOGLOBIN A1C: Hgb A1c MFr Bld: 5.6 % (ref 4.6–6.5)

## 2013-04-29 LAB — TSH: TSH: 4.33 u[IU]/mL (ref 0.35–5.50)

## 2013-04-29 LAB — PSA: PSA: 2.23 ng/mL (ref 0.10–4.00)

## 2013-05-01 ENCOUNTER — Telehealth: Payer: Self-pay

## 2013-05-01 NOTE — Telephone Encounter (Signed)
I called and spoke to patient letting him know all labs are normal except for a little elevation in cholesterol and Dr Alain Marion suggest exercise and loosing 5-10 pounds and recheck in 12 months.

## 2013-07-02 ENCOUNTER — Other Ambulatory Visit: Payer: Self-pay | Admitting: *Deleted

## 2013-07-02 DIAGNOSIS — R002 Palpitations: Secondary | ICD-10-CM

## 2013-07-08 ENCOUNTER — Encounter (INDEPENDENT_AMBULATORY_CARE_PROVIDER_SITE_OTHER): Payer: BC Managed Care – PPO

## 2013-07-08 ENCOUNTER — Encounter: Payer: Self-pay | Admitting: *Deleted

## 2013-07-08 DIAGNOSIS — R002 Palpitations: Secondary | ICD-10-CM

## 2013-07-08 NOTE — Progress Notes (Signed)
Patient ID: Jacob [name], male   DOB: [birth year], 63 y.o.   MRN: [id number] E-Cardio verite 30 day cardiac event monitor applied to patient.

## 2013-07-09 ENCOUNTER — Telehealth: Payer: Self-pay | Admitting: *Deleted

## 2013-07-09 NOTE — Telephone Encounter (Signed)
Received several recordings from ecardio on monitor that was placed yesterday.  Have left 2 messages for pt to call to see if symptomatic and who ordered the monitor and what the diagnosis used. Recordings left in triage. Recordings show HR from 160-167.  Recording showing SVT/vs sinus tach.

## 2013-07-23 ENCOUNTER — Encounter: Payer: Self-pay | Admitting: Physician Assistant

## 2013-07-23 ENCOUNTER — Ambulatory Visit (INDEPENDENT_AMBULATORY_CARE_PROVIDER_SITE_OTHER): Payer: BC Managed Care – PPO | Admitting: Physician Assistant

## 2013-07-23 VITALS — BP 140/80 | HR 54 | Ht 71.0 in | Wt 195.0 lb

## 2013-07-23 DIAGNOSIS — I471 Supraventricular tachycardia: Secondary | ICD-10-CM

## 2013-07-23 DIAGNOSIS — I498 Other specified cardiac arrhythmias: Secondary | ICD-10-CM

## 2013-07-23 DIAGNOSIS — R079 Chest pain, unspecified: Secondary | ICD-10-CM

## 2013-07-23 NOTE — Patient Instructions (Signed)
YOU HAVE AN APPT WITH DR. Lovena Le ON 08/25/13 3:30 PM DX SVT  NO CHANGES WERE MADE TODAY

## 2013-07-23 NOTE — Progress Notes (Signed)
Cardiology Office Note   Date:  07/23/2013   ID:  Kadin Canipe, DOB November 15, 1950, MRN [id number]  PCP:  Walker Kehr, MD  Cardiologist:  Dr. Liam Rogers      History of Present Illness: Jacob [name] is a 63 y.o. male who returns for the evaluation of palpitations and chest pain. He had significant GI bleed after polypectomy in 2012 with assoc chest pain. CP resolved after transfusion with PRBCs.  ETT-Echo was planned but I do not see that this was done.  Over the last several weeks, he has had episodes of rapid palpitations that sometimes awaken him from sleep.  He can often do a vagal maneuver and the symptoms resolve.  He has also noted episodes of chest tightness.  This occurs at rest.  He is an avid biker.  He can ride his bicycle without chest pain.   He denies chest pain with his episodes of palpitations.  He denies assoc symptoms.  He denies dyspnea.  He denies orthopnea, PND, edema.  He denies syncope.  He talked to a friend who is an orthopedist and he arranged an event monitor.  This has demonstrated SVT.   Recent Labs: 04/29/2013: ALT 16; Creatinine 1.0; HDL Cholesterol by NMR 54.90; Hemoglobin 15.8; LDL (calc) 142*; Potassium 4.4; TSH 4.33   Wt Readings from Last 3 Encounters:  07/23/13 195 lb (88.451 kg)  04/28/13 197 lb (89.359 kg)  10/30/11 184 lb (83.462 kg)     Past Medical History  Diagnosis Date  . Eczema     hot tub related  . Herpes zoster 2010  . GI bleed     s/p polypectomy    Current Outpatient Prescriptions  Medication Sig Dispense Refill  . aspirin 81 MG tablet Take 81 mg by mouth daily.        . cholecalciferol (VITAMIN D) 1000 UNITS tablet Take 1 tablet (1,000 Units total) by mouth daily.  100 tablet  3  . Glucosamine HCl-Glucosamin SO4 (GLUCOSAMINE COMPLEX PO) Take 1 each by mouth 2 (two) times daily.        . Multiple Vitamins-Minerals (MULTIVITAL PO) Take 1 each by mouth daily.        . Omega-3 Fatty Acids (FISH OIL) 1000 MG CAPS Take 1 capsule  by mouth 2 (two) times daily.         No current facility-administered medications for this visit.    Allergies:   Review of patient's allergies indicates no known allergies.   Social History:  The patient  reports that he has been passively smoking Cigars.  He does not have any smokeless tobacco history on file. He reports that he drinks alcohol. He reports that he does not use illicit drugs.   Family History:  The patient's family history includes Heart disease in his mother.   ROS:  Please see the history of present illness.      All other systems reviewed and negative.   PHYSICAL EXAM: VS:  BP 140/80  Pulse 54  Ht 5\' 11"  (1.803 m)  Wt 195 lb (88.451 kg)  BMI 27.21 kg/m2 Well nourished, well developed, in no acute distress HEENT: normal Neck: no JVD Cardiac:  normal S1, S2; RRR; no murmur Lungs:  clear to auscultation bilaterally, no wheezing, rhonchi or rales Abd: soft, nontender, no hepatomegaly Ext: no edema Skin: warm and dry Neuro:  CNs 2-12 intact, no focal abnormalities noted  EKG:  Sinus brady, HR 54, normal axis, no ST changes     Event  Monitor:  Episodes of SVT noted with HR 160-170  ASSESSMENT AND PLAN:  1. SVT (supraventricular tachycardia):  Reviewed Monitor with Dr. Liam Rogers who agreed this looks like SVT.  His resting HR is too slow to tolerate a beta blocker.  I have reviewed his case with Dr. Cristopher Peru.  He has reviewed the strips as well.  Question if this is AFlutter.  He will see the patient back after his monitor is completed to evaluate the patient and make further recommendations.   2. Chest pain, unspecified:  Atypical.  No further cardiac testing indicated at this time. 3. Disposition:  Arrange appointment with Dr. Cristopher Peru.     Signed, Versie Starks, MHS 07/23/2013 4:29 PM    [facility] [place], [place], [place]  [address] Phone: [phone]; Fax: [phone]

## 2013-07-27 ENCOUNTER — Telehealth: Payer: Self-pay | Admitting: *Deleted

## 2013-07-27 NOTE — Telephone Encounter (Signed)
Received some tracings from ecardio from 07/26/13 during 8-9:00CT showing SVT and Sinus Tach. Called pt and he said he was riding his bike during that time and he had notified ecardio that he had been riding bike.  States he felt fine during that time.  Denies any dizziness or lightheadedness. Reviewed with Margaret Pyle.  Pt has an app with Dr. Lovena Le on 7/8.  Advised pt to call if becomes symptomatic [id number] or dizzy.  He will comply

## 2013-08-03 ENCOUNTER — Telehealth: Payer: Self-pay | Admitting: *Deleted

## 2013-08-03 NOTE — Telephone Encounter (Signed)
Have called pt at 10:00; 1:00 and 5:00 to talk w/ pt regarding ecardio recordings from the weekend.  Had to leave messages x 3. Will leave recordings in triage room for followup on Tuesday.

## 2013-08-04 ENCOUNTER — Telehealth: Payer: Self-pay | Admitting: Internal Medicine

## 2013-08-04 NOTE — Telephone Encounter (Signed)
New message      Returning a call to triage.  Patient said Rodena Piety had left a msg on his vm to call triage regarding his monitor readings

## 2013-08-04 NOTE — Telephone Encounter (Signed)
Agree.  This sound like sinus tach related to cycling. He is to keep apt. With Dr. Lovena Le

## 2013-08-04 NOTE — Telephone Encounter (Signed)
Pt called to return Rodena Piety RN call from yesterday.  Rodena Piety called pt because ecardio results were faxed to our office as a serious notification showing pts HR in the 160s.  Went thru the ecardio report and times with the pt.  Pt states at the time of the ecardio report showing serious notification, he was riding his bicycle from 1pm to about 1:55pm on 6/21. Pt states he was not feeling symptomatic at that time.  Pt denies any cp or sob at this current time.  Pt states he has not had any events occur in the past week or so.  Pt states he is to f/u with Dr Lovena Le on 7/8 for SVT.  Advised pt to continue with this scheduled appt.  Advised pt that if he feels any cardiac events, to record this and call our office to make [place] aware of current symptoms.  Pt verbalized understanding and agrees with this plan.  Pt very grateful for all the follow-up that our office has provided.  Will forward this message to Richardson Dopp PA and Dr Acie Fredrickson for further review.

## 2013-08-19 ENCOUNTER — Encounter: Payer: Self-pay | Admitting: *Deleted

## 2013-08-19 ENCOUNTER — Encounter: Payer: Self-pay | Admitting: Internal Medicine

## 2013-08-19 ENCOUNTER — Ambulatory Visit (INDEPENDENT_AMBULATORY_CARE_PROVIDER_SITE_OTHER): Payer: BC Managed Care – PPO | Admitting: Internal Medicine

## 2013-08-19 VITALS — BP 138/79 | HR 59 | Ht 71.0 in | Wt 194.0 lb

## 2013-08-19 DIAGNOSIS — I471 Supraventricular tachycardia: Secondary | ICD-10-CM | POA: Insufficient documentation

## 2013-08-19 DIAGNOSIS — I498 Other specified cardiac arrhythmias: Secondary | ICD-10-CM

## 2013-08-19 LAB — CBC WITH DIFFERENTIAL/PLATELET
Basophils Absolute: 0 10*3/uL (ref 0.0–0.1)
Basophils Relative: 0.6 % (ref 0.0–3.0)
Eosinophils Absolute: 0.1 10*3/uL (ref 0.0–0.7)
Eosinophils Relative: 2.2 % (ref 0.0–5.0)
HCT: 45.3 % (ref 39.0–52.0)
Hemoglobin: 15.3 g/dL (ref 13.0–17.0)
Lymphocytes Relative: 41.6 % (ref 12.0–46.0)
Lymphs Abs: 2.4 10*3/uL (ref 0.7–4.0)
MCHC: 33.8 g/dL (ref 30.0–36.0)
MCV: 91.7 fl (ref 78.0–100.0)
Monocytes Absolute: 0.6 10*3/uL (ref 0.1–1.0)
Monocytes Relative: 9.8 % (ref 3.0–12.0)
Neutro Abs: 2.6 10*3/uL (ref 1.4–7.7)
Neutrophils Relative %: 45.8 % (ref 43.0–77.0)
Platelets: 189 10*3/uL (ref 150.0–400.0)
RBC: 4.94 Mil/uL (ref 4.22–5.81)
RDW: 13.2 % (ref 11.5–15.5)
WBC: 5.8 10*3/uL (ref 4.0–10.5)

## 2013-08-19 LAB — BASIC METABOLIC PANEL
BUN: 13 mg/dL (ref 6–23)
CO2: 31 mEq/L (ref 19–32)
Calcium: 9.4 mg/dL (ref 8.4–10.5)
Chloride: 102 mEq/L (ref 96–112)
Creatinine, Ser: 1 mg/dL (ref 0.4–1.5)
GFR: 84.98 mL/min (ref >60.00)
Glucose, Bld: 100 mg/dL — ABNORMAL HIGH (ref 70–99)
Potassium: 4 mEq/L (ref 3.5–5.1)
Sodium: 137 mEq/L (ref 135–145)

## 2013-08-19 NOTE — Assessment & Plan Note (Signed)
We discussed the mechanism of his heart racing as well as the uncertainty of catheter ablation. I offered him initiation of beta blocker or Calcium channel blockers as well as proceeding with catheter ablation. He is unwilling to try beta or calcium blockers. The risks/benefits/goals/expecation of catheter ablation were discussed and he wishes to proceed.

## 2013-08-19 NOTE — Progress Notes (Signed)
      HPI Jacob [name] is referred today by Richardson Dopp for evaluation of palpitations and possible SVT. 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 He notes a 5 year history of palpitations which typically awaken him from sleep. He also will have symptoms of SVT when he is relaxing on the sofa after a long bike ride. He has never passed out but after he has episodes of heart racing he will feel tired and fatigued. He can make the episode stop with deep breathing. He wore a cardiac monitor which demonstrated a narrow QRS tachycardia at rates of 150-170.   No Known Allergies   Current Outpatient Prescriptions  Medication Sig Dispense Refill  . aspirin 81 MG tablet Take 81 mg by mouth daily.        . cholecalciferol (VITAMIN D) 1000 UNITS tablet Take 1 tablet (1,000 Units total) by mouth daily.  100 tablet  3  . Glucosamine HCl-Glucosamin SO4 (GLUCOSAMINE COMPLEX PO) Take 1 each by mouth 2 (two) times daily.        . Multiple Vitamins-Minerals (MULTIVITAL PO) Take 1 each by mouth daily.        . Omega-3 Fatty Acids (FISH OIL) 1000 MG CAPS Take 1 capsule by mouth 2 (two) times daily.        Marland Kitchen OVER THE COUNTER MEDICATION Take 500 mg by mouth daily. Lysin tab once daily       No current facility-administered medications for this visit.     Past Medical History  Diagnosis Date  . Eczema     hot tub related  . Herpes zoster 2010  . GI bleed     s/p polypectomy    ROS:   All systems reviewed and negative except as noted in the HPI.   Past Surgical History  Procedure Laterality Date  . Rotator cuff repair  2003    right  . Appendectomy  05/2010     Family History  Problem Relation Age of Onset  . Heart disease Mother     pacemaker     History   Social History  . Marital Status: Married    Spouse Name: N/A    Number of Children: N/A  . Years of Education: N/A   Occupational History  . Not on file.   Social History Main Topics  . Smoking status: Passive  Smoke Exposure - Never Smoker    Types: Cigars  . Smokeless tobacco: Not on file  . Alcohol Use: Yes  . Drug Use: No  . Sexual Activity: Yes   Other Topics Concern  . Not on file   Social History Narrative   Regular exercise- yes, cycling     BP 138/79  Pulse 59  Ht 5\' 11"  (1.803 m)  Wt 194 lb (87.998 kg)  BMI 27.07 kg/m2  Physical Exam:  Well appearing 63 yo man, NAD HEENT: Unremarkable Neck:  No JVD, no thyromegally Back:  No CVA tenderness Lungs:  Clear with no wheezes HEART:  Regular rate rhythm, no murmurs, no rubs, no clicks Abd:  soft, positive bowel sounds, no organomegally, no rebound, no guarding Ext:  2 plus pulses, no edema, no cyanosis, no clubbing Skin:  No rashes no nodules Neuro:  CN II through XII intact, motor grossly intact  EKG - nsr   Assess/Plan:

## 2013-08-19 NOTE — Patient Instructions (Addendum)
Your physician recommends that you continue on your current medications as directed. Please refer to the Current Medication list given to you today.  Your physician has recommended that you have an SVT ablation. Catheter ablation is a medical procedure used to treat some cardiac arrhythmias (irregular heartbeats). During catheter ablation, a long, thin, flexible tube is put into a blood vessel in your groin (upper thigh), or neck. This tube is called an ablation catheter. It is then guided to your heart through the blood vessel. Radio frequency waves destroy small areas of heart tissue where abnormal heartbeats may cause an arrhythmia to start. Please see the instruction sheet given to you today.  You are scheduled for 7/13  Pre procedure labs today: BMET, CBCD

## 2013-08-20 ENCOUNTER — Telehealth: Payer: Self-pay | Admitting: *Deleted

## 2013-08-20 ENCOUNTER — Encounter (HOSPITAL_COMMUNITY): Payer: Self-pay | Admitting: Pharmacy Technician

## 2013-08-20 NOTE — Telephone Encounter (Signed)
PT  AWARE OF MONITOR  RESULTS  PER  DR NISHAN  SR  SINUS TACH WHILE  RIDING BICYCLE./CY PT  SCHEDULED  FOR  ABLATION  WITH  DR Lovena Le   ON MON  08-24-13 .Adonis Housekeeper

## 2013-08-24 ENCOUNTER — Ambulatory Visit (HOSPITAL_COMMUNITY)
Admission: RE | Admit: 2013-08-24 | Discharge: 2013-08-24 | Disposition: A | Discharge status: Home / Self Care | Payer: BC Managed Care – PPO | Source: Ambulatory Visit | Attending: Internal Medicine | Admitting: Internal Medicine

## 2013-08-24 ENCOUNTER — Encounter (HOSPITAL_COMMUNITY)
Admission: RE | Disposition: A | Discharge status: Home / Self Care | Payer: Self-pay | Source: Ambulatory Visit | Attending: Internal Medicine

## 2013-08-24 DIAGNOSIS — Z7982 Long term (current) use of aspirin: Secondary | ICD-10-CM | POA: Insufficient documentation

## 2013-08-24 DIAGNOSIS — I471 Supraventricular tachycardia: Secondary | ICD-10-CM

## 2013-08-24 DIAGNOSIS — I498 Other specified cardiac arrhythmias: Secondary | ICD-10-CM | POA: Insufficient documentation

## 2013-08-24 HISTORY — DX: Supraventricular tachycardia: I47.1

## 2013-08-24 HISTORY — DX: Supraventricular tachycardia, unspecified: I47.10

## 2013-08-24 HISTORY — PX: SUPRAVENTRICULAR TACHYCARDIA ABLATION: SHX5492

## 2013-08-24 HISTORY — PX: ELECTROPHYSIOLOGY STUDY: SHX5802

## 2013-08-24 SURGERY — SUPRAVENTRICULAR TACHYCARDIA ABLATION
Anesthesia: LOCAL

## 2013-08-24 MED ORDER — ISOPROTERENOL HCL 0.2 MG/ML IJ SOLN
INTRAMUSCULAR | Status: AC
  Filled 2013-08-24: qty 5

## 2013-08-24 MED ORDER — FENTANYL CITRATE 0.05 MG/ML IJ SOLN
INTRAMUSCULAR | Status: AC
  Filled 2013-08-24: qty 2

## 2013-08-24 MED ORDER — MIDAZOLAM HCL 5 MG/5ML IJ SOLN
INTRAMUSCULAR | Status: AC
  Filled 2013-08-24: qty 5

## 2013-08-24 MED ORDER — SODIUM CHLORIDE 0.9 % IJ SOLN: 3.0000 mL | Freq: Two times a day (BID) | INTRAMUSCULAR | Status: DC

## 2013-08-24 MED ORDER — SODIUM CHLORIDE 0.9 % IV SOLN: 250.0000 mL | INTRAVENOUS | Status: DC | PRN

## 2013-08-24 MED ORDER — ONDANSETRON HCL 4 MG/2ML IJ SOLN: 4.0000 mg | Freq: Four times a day (QID) | INTRAMUSCULAR | Status: DC | PRN

## 2013-08-24 MED ORDER — SODIUM CHLORIDE 0.9 % IJ SOLN: 3.0000 mL | INTRAMUSCULAR | Status: DC | PRN

## 2013-08-24 MED ORDER — ACETAMINOPHEN 325 MG PO TABS
650.0000 mg | ORAL_TABLET | ORAL | Status: DC | PRN
  Filled 2013-08-24: qty 2

## 2013-08-24 MED ORDER — BUPIVACAINE HCL (PF) 0.25 % IJ SOLN
INTRAMUSCULAR | Status: AC
  Filled 2013-08-24: qty 60

## 2013-08-24 MED ORDER — HEPARIN (PORCINE) IN NACL 2-0.9 UNIT/ML-% IJ SOLN
INTRAMUSCULAR | Status: AC
  Filled 2013-08-24: qty 500

## 2013-08-24 NOTE — Discharge Instructions (Signed)
Angiogram, Care After Refer to this sheet in the next few weeks. These instructions provide you with information on caring for yourself after your procedure. Your health care provider may also give you more specific instructions. Your treatment has been planned according to current medical practices, but problems sometimes occur. Call your health care provider if you have any problems or questions after your procedure.  WHAT TO EXPECT AFTER THE PROCEDURE After your procedure, it is typical to have the following sensations:  Minor discomfort or tenderness and a small bump at the catheter insertion site. The bump should usually decrease in size and tenderness within 1 to 2 weeks.  Any bruising will usually fade within 2 to 4 weeks. HOME CARE INSTRUCTIONS   You may need to keep taking blood thinners if they were prescribed for you. Only take over-the-counter or prescription medicines for pain, fever, or discomfort as directed by your health care provider.  Do not apply powder or lotion to the site.  Do not sit in a bathtub, swimming pool, or whirlpool for 5 to 7 days.  You may shower 24 hours after the procedure. Remove the bandage (dressing) and gently wash the site with plain soap and water. Gently pat the site dry.  Inspect the site at least twice daily.  Limit your activity for the first 48 hours. Do not bend, squat, or lift anything over 20 lb (9 kg) or as directed by your health care provider.  Do not drive home if you are discharged the day of the procedure. Have someone else drive you. Follow instructions about when you can drive or return to work. SEEK MEDICAL CARE IF:  You get lightheaded when standing up.  You have drainage (other than a small amount of blood on the dressing).  You have chills.  You have a fever.  You have redness, warmth, swelling, or pain at the insertion site. SEEK IMMEDIATE MEDICAL CARE IF:   You develop chest pain or shortness of breath, feel faint,  or pass out.  You have bleeding, swelling larger than a walnut, or drainage from the catheter insertion site.  You develop pain, discoloration, coldness, or severe bruising in the leg or arm that held the catheter.  You develop bleeding from any other place, such as the bowels. You may see bright red blood in your urine or stools, or your stools may appear black and tarry.  You have heavy bleeding from the site. If this happens, hold pressure on the site. MAKE SURE YOU:  Understand these instructions.  Will watch your condition.  Will get help right away if you are not doing well or get worse. Document Released: 08/17/2004 Document Revised: 02/03/2013 Document Reviewed: 06/23/2012 [facility] Patient Information 2015 [place], [place]. This information is not intended to replace advice given to you by your health care provider. Make sure you discuss any questions you have with your health care provider. Marland Kitchen

## 2013-08-25 ENCOUNTER — Institutional Professional Consult (permissible substitution): Payer: BC Managed Care – PPO | Admitting: Internal Medicine

## 2013-08-25 NOTE — CV Procedure (Signed)
EP procedure note  Procedure: Invasive electrophysiologic study utilizing isoproterenol  Indication: Symptomatic SVT, with the patient unable take AV node blocking drugs  Description of the procedure: After informed consent was obtained, the patient was taken to the diagnostic EP lab in the fasting state. After the usual preparation and draping, a 6 French quadripolar catheter was inserted percutaneously into the right femoral vein and advanced to the right ventricle. A 6 French quadripolar catheter was inserted percutaneously into the right femoral vein and advanced to the His bundle region. A 6 French decapolar catheter was inserted percutaneously into the right femoral vein and advanced into the coronary sinus. After measurement of the basic intervals, rapid ventricular pacing was carried out from the right ventricle demonstrating VA block at 600 ms. Next programmed ventricular stimulation was carried out demonstrating VA block at 600 ms. Next programmed atrial stimulation was carried out from the coronary sinus at a pacing cycle length 600 ms. The S1 and S2 interval with stepwise decrease down to 500 ms with a AV node ERP was demonstrated. Finally, rapid atrial pacing was carried out at a pacing cycle length of 600 ms, and stepwise decrease down to 480 ms were AV block was demonstrated. The patient was sedated and had a high degree of vagal tone. Isoproterenol was infused at rates from 1-4 mcg per minute. On isoproterenol, additional rapid ventricular pacing was carried out demonstrating VA block at 300 ms. Atrial activation was midline and decremental. There were no inducible arrhythmias. Programmed ventricular stimulation was then carried out at a pacing cycle length of 500 ms. The S1-S2 interval with stepwise decreased from 440 ms down to 280 ms where ventricular refractoriness was observed. During programmed ventricular stimulation, the atrial activation was midline and decremental. Next programmed  atrial stimulation was carried out at basic drive cycle lengths of 500 and 400 ms. The S1 and S2 interval with stepwise decreased down to the AV node ERP. During programmed atrial stimulation, there were multiple AH jumps and echo beats. There was no inducible SVT. Finally, rapid atrial pacing was carried out and stepwise decrease down to 260 ms. During rapid atrial pacing, the PR interval was equal to but not greater than the RR interval. There is no inducible SVT. At this point, we considered slow pathway ablation. However because of no inducible sustained SVT, and no particularly good endpoint for ablation, the procedure was discontinued, the catheters were removed, and the patient was returned to his room in satisfactory condition. He will undergo watchful waiting.  Complications: There were no major procedure complications.  Results. A. Baseline ECG. The baseline ECG demonstrates normal sinus rhythm with no preexcitation. B. Baseline intervals. The sinus node cycle length was 950 ms. The AH interval was 95 ms. The HV interval was 45 ms. C. Rapid ventricular pacing. Initially rapid ventricular pacing demonstrated VA block at 600 ms. On isoproterenol, rapid ventricular pacing demonstrated midline, atrial activation and no inducible SVT. D. Programmed ventricular stimulation. Programmed ventricular stimulation on isoproterenol demonstrated midline, atrial activation which was decremental. There is no inducible SVT. The retrograde AV node ERP was dependent on the amount of isoproterenol infused. At baseline, the AV block was present at 600 ms. E. Programmed atrial stimulation. Programmed atrial stimulation was carried out from the coronary sinus and right atrium at basic drive cycles of 468, 500, and 400 milliseconds. On isoproterenol, programmed atrial stimulation resulted in multiple AH jumps and echo beats, but no inducible SVT. F. Rapid atrial pacing. Rapid atrial pacing  was carried out from the  atrium at a pacing cycle length of 600 ms. The patient cycle length was stepwise reduced down to AV block. During rapid atrial pacing, the PR interval was equal to the RR interval but there was no inducible SVT. G. Arrhythmias observed. There were no inducible sustained arrhythmias observed.  Conclusion. This invasive electrophysiologic study demonstrated no inducible sustained SVT despite a history of SVT, and despite evidence of dual AV nodal physiology. In the absence of any sustained SVT, the decision was made not to ablate.  Cristopher Peru, M.D.

## 2013-08-26 NOTE — H&P (View-Only) (Signed)
      HPI [name] is referred today by Richardson Dopp for evaluation of palpitations and possible SVT. 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 He notes a 5 year history of palpitations which typically awaken him from sleep. He also will have symptoms of SVT when he is relaxing on the sofa after a long bike ride. He has never passed out but after he has episodes of heart racing he will feel tired and fatigued. He can make the episode stop with deep breathing. He wore a cardiac monitor which demonstrated a narrow QRS tachycardia at rates of 150-170.   No Known Allergies   Current Outpatient Prescriptions  Medication Sig Dispense Refill  . aspirin 81 MG tablet Take 81 mg by mouth daily.        . cholecalciferol (VITAMIN D) 1000 UNITS tablet Take 1 tablet (1,000 Units total) by mouth daily.  100 tablet  3  . Glucosamine HCl-Glucosamin SO4 (GLUCOSAMINE COMPLEX PO) Take 1 each by mouth 2 (two) times daily.        . Multiple Vitamins-Minerals (MULTIVITAL PO) Take 1 each by mouth daily.        . Omega-3 Fatty Acids (FISH OIL) 1000 MG CAPS Take 1 capsule by mouth 2 (two) times daily.        Marland Kitchen OVER THE COUNTER MEDICATION Take 500 mg by mouth daily. Lysin tab once daily       No current facility-administered medications for this visit.     Past Medical History  Diagnosis Date  . Eczema     hot tub related  . Herpes zoster 2010  . GI bleed     s/p polypectomy    ROS:   All systems reviewed and negative except as noted in the HPI.   Past Surgical History  Procedure Laterality Date  . Rotator cuff repair  2003    right  . Appendectomy  05/2010     Family History  Problem Relation Age of Onset  . Heart disease Mother     pacemaker     History   Social History  . Marital Status: Married    Spouse Name: N/A    Number of Children: N/A  . Years of Education: N/A   Occupational History  . Not on file.   Social History Main Topics  . Smoking status: Passive  Smoke Exposure - Never Smoker    Types: Cigars  . Smokeless tobacco: Not on file  . Alcohol Use: Yes  . Drug Use: No  . Sexual Activity: Yes   Other Topics Concern  . Not on file   Social History Narrative   Regular exercise- yes, cycling     BP 138/79  Pulse 59  Ht 5\' 11"  (1.803 m)  Wt 194 lb (87.998 kg)  BMI 27.07 kg/m2  Physical Exam:  Well appearing 63 yo man, NAD HEENT: Unremarkable Neck:  No JVD, no thyromegally Back:  No CVA tenderness Lungs:  Clear with no wheezes HEART:  Regular rate rhythm, no murmurs, no rubs, no clicks Abd:  soft, positive bowel sounds, no organomegally, no rebound, no guarding Ext:  2 plus pulses, no edema, no cyanosis, no clubbing Skin:  No rashes no nodules Neuro:  CN II through XII intact, motor grossly intact  EKG - nsr   Assess/Plan:

## 2013-08-26 NOTE — Interval H&P Note (Signed)
History and Physical Interval Note:  08/26/2013 8:40 PM  Jacob [name]  has presented today for surgery, with the diagnosis of SVT  The various methods of treatment have been discussed with the patient and family. After consideration of risks, benefits and other options for treatment, the patient has consented to  Procedure(s): SUPRAVENTRICULAR TACHYCARDIA ABLATION (N/A) as a surgical intervention .  The patient's history has been reviewed, patient examined, no change in status, stable for surgery.  I have reviewed the patient's chart and labs.  Questions were answered to the patient's satisfaction.     Cristopher Peru, M.D.

## 2013-09-05 ENCOUNTER — Encounter (HOSPITAL_COMMUNITY): Payer: Self-pay | Admitting: *Deleted

## 2014-01-21 ENCOUNTER — Encounter (HOSPITAL_COMMUNITY): Payer: Self-pay | Admitting: Internal Medicine

## 2014-06-11 ENCOUNTER — Ambulatory Visit (INDEPENDENT_AMBULATORY_CARE_PROVIDER_SITE_OTHER): Payer: BLUE CROSS/BLUE SHIELD | Admitting: Internal Medicine

## 2014-06-11 ENCOUNTER — Other Ambulatory Visit (INDEPENDENT_AMBULATORY_CARE_PROVIDER_SITE_OTHER): Payer: BLUE CROSS/BLUE SHIELD

## 2014-06-11 ENCOUNTER — Encounter: Payer: Self-pay | Admitting: Internal Medicine

## 2014-06-11 VITALS — BP 130/88 | HR 70 | Ht 71.5 in | Wt 199.0 lb

## 2014-06-11 DIAGNOSIS — Z0000 Encounter for general adult medical examination without abnormal findings: Secondary | ICD-10-CM

## 2014-06-11 DIAGNOSIS — I471 Supraventricular tachycardia: Secondary | ICD-10-CM

## 2014-06-11 LAB — CBC WITH DIFFERENTIAL/PLATELET
Basophils Absolute: 0 10*3/uL (ref 0.0–0.1)
Basophils Relative: 0.7 % (ref 0.0–3.0)
Eosinophils Absolute: 0.3 10*3/uL (ref 0.0–0.7)
Eosinophils Relative: 4.1 % (ref 0.0–5.0)
HCT: 45.6 % (ref 39.0–52.0)
Hemoglobin: 15.6 g/dL (ref 13.0–17.0)
Lymphocytes Relative: 43.1 % (ref 12.0–46.0)
Lymphs Abs: 2.7 10*3/uL (ref 0.7–4.0)
MCHC: 34.3 g/dL (ref 30.0–36.0)
MCV: 89.1 fl (ref 78.0–100.0)
Monocytes Absolute: 0.6 10*3/uL (ref 0.1–1.0)
Monocytes Relative: 9.6 % (ref 3.0–12.0)
Neutro Abs: 2.7 10*3/uL (ref 1.4–7.7)
Neutrophils Relative %: 42.5 % — ABNORMAL LOW (ref 43.0–77.0)
Platelets: 201 10*3/uL (ref 150.0–400.0)
RBC: 5.12 Mil/uL (ref 4.22–5.81)
RDW: 13.3 % (ref 11.5–15.5)
WBC: 6.3 10*3/uL (ref 4.0–10.5)

## 2014-06-11 LAB — HEPATIC FUNCTION PANEL
ALT: 15 U/L (ref 0–53)
AST: 22 U/L (ref 0–37)
Albumin: 4.7 g/dL (ref 3.5–5.2)
Alkaline Phosphatase: 48 U/L (ref 39–117)
Bilirubin, Direct: 0.2 mg/dL (ref 0.0–0.3)
Total Bilirubin: 0.9 mg/dL (ref 0.2–1.2)
Total Protein: 7 g/dL (ref 6.0–8.3)

## 2014-06-11 LAB — URINALYSIS
Bilirubin Urine: NEGATIVE
Hgb urine dipstick: NEGATIVE
Ketones, ur: NEGATIVE
Leukocytes, UA: NEGATIVE
Nitrite: NEGATIVE
Specific Gravity, Urine: 1.02 (ref 1.000–1.030)
Total Protein, Urine: NEGATIVE
Urine Glucose: NEGATIVE
Urobilinogen, UA: 0.2 (ref 0.0–1.0)
pH: 5.5 (ref 5.0–8.0)

## 2014-06-11 LAB — BASIC METABOLIC PANEL
BUN: 15 mg/dL (ref 6–23)
CO2: 29 mEq/L (ref 19–32)
Calcium: 9.6 mg/dL (ref 8.4–10.5)
Chloride: 101 mEq/L (ref 96–112)
Creatinine, Ser: 0.99 mg/dL (ref 0.40–1.50)
GFR: 80.82 mL/min (ref >60.00)
Glucose, Bld: 87 mg/dL (ref 70–99)
Potassium: 4.1 mEq/L (ref 3.5–5.1)
Sodium: 136 mEq/L (ref 135–145)

## 2014-06-11 LAB — LIPID PANEL
Cholesterol: 210 mg/dL — ABNORMAL HIGH (ref 0–200)
HDL: 50.8 mg/dL (ref >39.00)
LDL Cholesterol: 138 mg/dL — ABNORMAL HIGH (ref 0–99)
NonHDL: 159.2
Total CHOL/HDL Ratio: 4
Triglycerides: 105 mg/dL (ref 0.0–149.0)
VLDL: 21 mg/dL (ref 0.0–40.0)

## 2014-06-11 LAB — TSH: TSH: 5.82 u[IU]/mL — ABNORMAL HIGH (ref 0.35–4.50)

## 2014-06-11 LAB — TESTOSTERONE: Testosterone: 364.6 ng/dL (ref 300.00–890.00)

## 2014-06-11 LAB — PSA: PSA: 2.86 ng/mL (ref 0.10–4.00)

## 2014-06-11 NOTE — Progress Notes (Signed)
Pre visit review using our clinic review tool, if applicable. No additional management support is needed unless otherwise documented below in the visit note. 

## 2014-06-11 NOTE — Assessment & Plan Note (Signed)
We discussed age appropriate health related issues, including available/recomended screening tests and vaccinations. We discussed a need for adhering to healthy diet and exercise. Labs/EKG were reviewed/ordered. All questions were answered.   

## 2014-06-11 NOTE — Patient Instructions (Signed)
Arch supports

## 2014-06-11 NOTE — Assessment & Plan Note (Addendum)
2015: Jacob [name] is s/p evaluation of palpitations and possible SVT. 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 He notes a 5 year history of palpitations which typically awaken him from sleep. He also will have symptoms of SVT when he is relaxing on the sofa after a long bike ride. He has never passed out but after he has episodes of heart racing he will feel tired and fatigued. He can make the episode stop with deep breathing. He wore a cardiac monitor which demonstrated a narrow QRS tachycardia at rates of 150-170.  Med options discussed

## 2014-06-11 NOTE — Progress Notes (Signed)
Subjective:    HPI  The patient is here for a wellness exam. C/o some heel pain C/o occ palpitations  2015: Jacob [name] is s/p evaluation of palpitations and possible SVT. 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 He notes a 5 year history of palpitations which typically awaken him from sleep. He also will have symptoms of SVT when he is relaxing on the sofa after a long bike ride. He has never passed out but after he has episodes of heart racing he will feel tired and fatigued. He can make the episode stop with deep breathing. He wore a cardiac monitor which demonstrated a narrow QRS tachycardia at rates of 150-170.   F/u anemia, phlebitis - remote  Wt Readings from Last 3 Encounters:  06/11/14 199 lb (90.266 kg)  08/24/13 190 lb (86.183 kg)  08/19/13 194 lb (87.998 kg)   BP Readings from Last 3 Encounters:  06/11/14 130/88  08/24/13 119/70  08/19/13 138/79     Review of Systems  Constitutional: Negative for chills, appetite change, fatigue and unexpected weight change.  HENT: Negative for congestion, nosebleeds, sneezing, sore throat and trouble swallowing.   Eyes: Negative for itching and visual disturbance.  Respiratory: Negative for apnea, cough, shortness of breath and wheezing.   Cardiovascular: Negative for chest pain, palpitations and leg swelling.  Gastrointestinal: Negative for nausea, diarrhea, blood in stool and abdominal distention.  Genitourinary: Negative for urgency, frequency, hematuria, flank pain, decreased urine volume, scrotal swelling and testicular pain.  Musculoskeletal: Positive for arthralgias. Negative for myalgias, back pain, joint swelling, gait problem and neck pain.  Skin: Negative for pallor and rash.  Neurological: Negative for dizziness, tremors, speech difficulty, weakness, numbness and headaches.  Psychiatric/Behavioral: Negative for suicidal ideas, sleep disturbance, dysphoric mood and agitation. The patient is not nervous/anxious.         Objective:   Physical Exam  Constitutional: He is oriented to person, place, and time. He appears well-developed and well-nourished.  HENT:  Mouth/Throat: Oropharynx is clear and moist.  Eyes: Conjunctivae are normal. Pupils are equal, round, and reactive to light.  Neck: Normal range of motion. No JVD present. No thyromegaly present.  Cardiovascular: Normal rate, regular rhythm, normal heart sounds and intact distal pulses.  Exam reveals no gallop and no friction rub.   No murmur heard. Pulmonary/Chest: Effort normal and breath sounds normal. No respiratory distress. He has no wheezes. He has no rales. He exhibits no tenderness.  Abdominal: Soft. Bowel sounds are normal. He exhibits no distension and no mass. There is no tenderness. There is no rebound and no guarding.  Genitourinary: Rectum normal and prostate normal. Guaiac negative stool.  Musculoskeletal: Normal range of motion. He exhibits no edema or tenderness.  Lymphadenopathy:    He has no cervical adenopathy.  Neurological: He is alert and oriented to person, place, and time. He has normal reflexes. No cranial nerve deficit. He exhibits normal muscle tone. Coordination normal.  Skin: Skin is warm and dry. No rash noted.  Psychiatric: He has a normal mood and affect. His behavior is normal. Judgment and thought content normal.    Lab Results  Component Value Date   WBC 5.8 08/19/2013   HGB 15.3 08/19/2013   HCT 45.3 08/19/2013   PLT 189.0 08/19/2013   GLUCOSE 100* 08/19/2013   CHOL 213* 04/29/2013   TRIG 83.0 04/29/2013   HDL 54.90 04/29/2013   LDLDIRECT 126.2 10/24/2011   LDLCALC 142* 04/29/2013   ALT 16 04/29/2013  AST 23 04/29/2013   NA 137 08/19/2013   K 4.0 08/19/2013   CL 102 08/19/2013   CREATININE 1.0 08/19/2013   BUN 13 08/19/2013   CO2 31 08/19/2013   TSH 4.33 04/29/2013   PSA 2.23 04/29/2013   INR 1.18 12/04/2010   HGBA1C 5.6 04/29/2013          Assessment & Plan:

## 2014-07-16 ENCOUNTER — Telehealth: Payer: Self-pay | Admitting: Internal Medicine

## 2014-07-16 ENCOUNTER — Other Ambulatory Visit (INDEPENDENT_AMBULATORY_CARE_PROVIDER_SITE_OTHER): Payer: BLUE CROSS/BLUE SHIELD

## 2014-07-16 DIAGNOSIS — R7989 Other specified abnormal findings of blood chemistry: Secondary | ICD-10-CM | POA: Diagnosis not present

## 2014-07-16 LAB — TSH: TSH: 4.01 u[IU]/mL (ref 0.35–4.50)

## 2014-07-16 LAB — T4, FREE: Free T4: 0.71 ng/dL (ref 0.60–1.60)

## 2014-07-16 NOTE — Telephone Encounter (Signed)
Pt called in and said that he thought that he was to come back to get his tyroid rechecked?  Was he suppose too?

## 2014-07-16 NOTE — Telephone Encounter (Signed)
Yes, he does. Labs ordered. Left detailed mess informing pt.

## 2015-03-29 DIAGNOSIS — H2513 Age-related nuclear cataract, bilateral: Secondary | ICD-10-CM | POA: Diagnosis not present

## 2015-10-27 ENCOUNTER — Telehealth: Payer: Self-pay | Admitting: Internal Medicine

## 2015-10-27 DIAGNOSIS — Z125 Encounter for screening for malignant neoplasm of prostate: Secondary | ICD-10-CM

## 2015-10-27 DIAGNOSIS — Z0000 Encounter for general adult medical examination without abnormal findings: Secondary | ICD-10-CM

## 2015-10-27 NOTE — Telephone Encounter (Signed)
TSH, PSA, CMET, CBC, UA, lipids, UA Thx

## 2015-10-27 NOTE — Telephone Encounter (Signed)
Patient states he was told to have labs done before CPE.  He went this morning and there were no labs entered.  Please enter and follow up with patient in regard.

## 2015-10-28 NOTE — Telephone Encounter (Signed)
Notified pt MD ok labs orders has been placed...Jacob [name]

## 2015-10-31 ENCOUNTER — Other Ambulatory Visit (INDEPENDENT_AMBULATORY_CARE_PROVIDER_SITE_OTHER): Payer: Medicare Other

## 2015-10-31 DIAGNOSIS — Z125 Encounter for screening for malignant neoplasm of prostate: Secondary | ICD-10-CM | POA: Diagnosis not present

## 2015-10-31 DIAGNOSIS — Z0000 Encounter for general adult medical examination without abnormal findings: Secondary | ICD-10-CM

## 2015-10-31 LAB — BASIC METABOLIC PANEL
BUN: 22 mg/dL (ref 6–23)
CO2: 26 mEq/L (ref 19–32)
Calcium: 9.2 mg/dL (ref 8.4–10.5)
Chloride: 106 mEq/L (ref 96–112)
Creatinine, Ser: 1.04 mg/dL (ref 0.40–1.50)
GFR: 76.02 mL/min (ref >60.00)
Glucose, Bld: 106 mg/dL — ABNORMAL HIGH (ref 70–99)
Potassium: 4.8 mEq/L (ref 3.5–5.1)
Sodium: 140 mEq/L (ref 135–145)

## 2015-10-31 LAB — URINALYSIS, ROUTINE W REFLEX MICROSCOPIC
Bilirubin Urine: NEGATIVE
Hgb urine dipstick: NEGATIVE
Ketones, ur: NEGATIVE
Leukocytes, UA: NEGATIVE
Nitrite: NEGATIVE
RBC / HPF: NONE SEEN (ref 0–?)
Specific Gravity, Urine: 1.025 (ref 1.000–1.030)
Total Protein, Urine: NEGATIVE
Urine Glucose: NEGATIVE
Urobilinogen, UA: 0.2 (ref 0.0–1.0)
WBC, UA: NONE SEEN (ref 0–?)
pH: 5.5 (ref 5.0–8.0)

## 2015-10-31 LAB — CBC WITH DIFFERENTIAL/PLATELET
Basophils Absolute: 0 10*3/uL (ref 0.0–0.1)
Basophils Relative: 0.7 % (ref 0.0–3.0)
Eosinophils Absolute: 0.2 10*3/uL (ref 0.0–0.7)
Eosinophils Relative: 4.2 % (ref 0.0–5.0)
HCT: 45 % (ref 39.0–52.0)
Hemoglobin: 15.2 g/dL (ref 13.0–17.0)
Lymphocytes Relative: 45 % (ref 12.0–46.0)
Lymphs Abs: 2.6 10*3/uL (ref 0.7–4.0)
MCHC: 33.9 g/dL (ref 30.0–36.0)
MCV: 91.4 fl (ref 78.0–100.0)
Monocytes Absolute: 0.6 10*3/uL (ref 0.1–1.0)
Monocytes Relative: 9.8 % (ref 3.0–12.0)
Neutro Abs: 2.3 10*3/uL (ref 1.4–7.7)
Neutrophils Relative %: 40.3 % — ABNORMAL LOW (ref 43.0–77.0)
Platelets: 189 10*3/uL (ref 150.0–400.0)
RBC: 4.92 Mil/uL (ref 4.22–5.81)
RDW: 12.8 % (ref 11.5–15.5)
WBC: 5.7 10*3/uL (ref 4.0–10.5)

## 2015-10-31 LAB — LIPID PANEL
Cholesterol: 171 mg/dL (ref 0–200)
HDL: 46.8 mg/dL (ref >39.00)
LDL Cholesterol: 101 mg/dL — ABNORMAL HIGH (ref 0–99)
NonHDL: 123.97
Total CHOL/HDL Ratio: 4
Triglycerides: 113 mg/dL (ref 0.0–149.0)
VLDL: 22.6 mg/dL (ref 0.0–40.0)

## 2015-10-31 LAB — PSA: PSA: 4.45 ng/mL — ABNORMAL HIGH (ref 0.10–4.00)

## 2015-10-31 LAB — TSH: TSH: 4.79 u[IU]/mL — ABNORMAL HIGH (ref 0.35–4.50)

## 2015-11-01 ENCOUNTER — Other Ambulatory Visit: Payer: Medicare Other

## 2015-11-01 ENCOUNTER — Encounter: Payer: Self-pay | Admitting: Internal Medicine

## 2015-11-01 ENCOUNTER — Other Ambulatory Visit: Payer: Self-pay | Admitting: Internal Medicine

## 2015-11-01 ENCOUNTER — Ambulatory Visit (INDEPENDENT_AMBULATORY_CARE_PROVIDER_SITE_OTHER): Payer: Medicare Other | Admitting: Internal Medicine

## 2015-11-01 VITALS — BP 130/80 | HR 58 | Temp 98.3°F | Ht 72.0 in | Wt 199.0 lb

## 2015-11-01 DIAGNOSIS — R972 Elevated prostate specific antigen [PSA]: Secondary | ICD-10-CM | POA: Diagnosis not present

## 2015-11-01 DIAGNOSIS — C61 Malignant neoplasm of prostate: Secondary | ICD-10-CM | POA: Insufficient documentation

## 2015-11-01 DIAGNOSIS — R946 Abnormal results of thyroid function studies: Secondary | ICD-10-CM | POA: Diagnosis not present

## 2015-11-01 DIAGNOSIS — E039 Hypothyroidism, unspecified: Secondary | ICD-10-CM | POA: Insufficient documentation

## 2015-11-01 DIAGNOSIS — Z23 Encounter for immunization: Secondary | ICD-10-CM

## 2015-11-01 DIAGNOSIS — Z0000 Encounter for general adult medical examination without abnormal findings: Secondary | ICD-10-CM | POA: Diagnosis not present

## 2015-11-01 DIAGNOSIS — R7989 Other specified abnormal findings of blood chemistry: Secondary | ICD-10-CM

## 2015-11-01 DIAGNOSIS — M5440 Lumbago with sciatica, unspecified side: Secondary | ICD-10-CM

## 2015-11-01 NOTE — Assessment & Plan Note (Signed)

## 2015-11-01 NOTE — Progress Notes (Signed)
Subjective:  Patient ID: Jacob [name], male    DOB: 10-14-1950  Age: 65 y.o. MRN: [id number]  CC: No chief complaint on file.   HPI Jacob [name] presents for [facility] well exam - new [facility]  Outpatient Medications Prior to Visit  Medication Sig Dispense Refill  . aspirin EC 81 MG tablet Take 81 mg by mouth every evening.     . cholecalciferol (VITAMIN D) 1000 UNITS tablet Take 1,000 Units by mouth every evening.     Marland Kitchen glucosamine-chondroitin 500-400 MG tablet Take 2 tablets by mouth every evening.    . Multiple Vitamins-Minerals (MULTIVITAL PO) Take 1 tablet by mouth every evening.     . Omega-3 Fatty Acids (FISH OIL) 1000 MG CAPS Take 2,000 mg by mouth every evening.     Marland Kitchen OVER THE COUNTER MEDICATION Take 500 mg by mouth every evening. Lysin tab once daily     No facility-administered medications prior to visit.     ROS Review of Systems  Constitutional: Negative for appetite change, fatigue and unexpected weight change.  HENT: Negative for congestion, nosebleeds, sneezing, sore throat and trouble swallowing.   Eyes: Negative for itching and visual disturbance.  Respiratory: Negative for cough.   Cardiovascular: Negative for chest pain, palpitations and leg swelling.  Gastrointestinal: Negative for abdominal distention, blood in stool, diarrhea and nausea.  Genitourinary: Positive for frequency. Negative for hematuria.  Musculoskeletal: Positive for arthralgias. Negative for back pain, gait problem, joint swelling and neck pain.  Skin: Negative for rash.  Neurological: Negative for dizziness, tremors, speech difficulty and weakness.  Psychiatric/Behavioral: Negative for agitation, dysphoric mood, sleep disturbance and suicidal ideas. The patient is not nervous/anxious.     Objective:  BP 130/80   Pulse (!) 58   Temp 98.3 F (36.8 C) (Oral)   Ht 6' (1.829 m)   Wt 199 lb (90.3 kg)   SpO2 96%   BMI 26.99 kg/m   BP Readings from Last 3 Encounters:  11/01/15 130/80  06/11/14  130/88  08/24/13 119/70    Wt Readings from Last 3 Encounters:  11/01/15 199 lb (90.3 kg)  06/11/14 199 lb (90.3 kg)  08/24/13 190 lb (86.2 kg)    Physical Exam  Constitutional: He is oriented to person, place, and time. He appears well-developed and well-nourished. No distress.  HENT:  Head: Normocephalic and atraumatic.  Right Ear: External ear normal.  Left Ear: External ear normal.  Nose: Nose normal.  Mouth/Throat: Oropharynx is clear and moist. No oropharyngeal exudate.  Eyes: Conjunctivae and EOM are normal. Pupils are equal, round, and reactive to light. Right eye exhibits no discharge. Left eye exhibits no discharge. No scleral icterus.  Neck: Normal range of motion. Neck supple. No JVD present. No tracheal deviation present. No thyromegaly present.  Cardiovascular: Normal rate, regular rhythm, normal heart sounds and intact distal pulses.  Exam reveals no gallop and no friction rub.   No murmur heard. Pulmonary/Chest: Effort normal and breath sounds normal. No stridor. No respiratory distress. He has no wheezes. He has no rales. He exhibits no tenderness.  Abdominal: Soft. Bowel sounds are normal. He exhibits no distension and no mass. There is no tenderness. There is no rebound and no guarding.  Genitourinary: Rectum normal and penis normal. Rectal exam shows guaiac negative stool. No penile tenderness.  Musculoskeletal: Normal range of motion. He exhibits no edema or tenderness.  Lymphadenopathy:    He has no cervical adenopathy.  Neurological: He is alert and oriented to person, place,  and time. He has normal reflexes. No cranial nerve deficit. He exhibits normal muscle tone. Coordination normal.  Skin: Skin is warm and dry. No rash noted. He is not diaphoretic. No erythema. No pallor.  Psychiatric: He has a normal mood and affect. His behavior is normal. Judgment and thought content normal.  prostate 1+  Lab Results  Component Value Date   WBC 5.7 10/31/2015   HGB  15.2 10/31/2015   HCT 45.0 10/31/2015   PLT 189.0 10/31/2015   GLUCOSE 106 (H) 10/31/2015   CHOL 171 10/31/2015   TRIG 113.0 10/31/2015   HDL 46.80 10/31/2015   LDLDIRECT 126.2 10/24/2011   LDLCALC 101 (H) 10/31/2015   ALT 15 06/11/2014   AST 22 06/11/2014   NA 140 10/31/2015   K 4.8 10/31/2015   CL 106 10/31/2015   CREATININE 1.04 10/31/2015   BUN 22 10/31/2015   CO2 26 10/31/2015   TSH 4.79 (H) 10/31/2015   PSA 4.45 (H) 10/31/2015   INR 1.18 12/04/2010   HGBA1C 5.6 04/29/2013    No results found.  Assessment & Plan:   There are no diagnoses linked to this encounter. I am having Mr. Ogburn maintain his Fish Oil, Multiple Vitamins-Minerals (MULTIVITAL PO), OVER THE COUNTER MEDICATION, aspirin EC, cholecalciferol, and glucosamine-chondroitin.  No orders of the defined types were placed in this encounter.    Follow-up: No Follow-up on file.  Walker Kehr, MD

## 2015-11-01 NOTE — Assessment & Plan Note (Signed)
Free PSA 

## 2015-11-01 NOTE — Patient Instructions (Signed)

## 2015-11-01 NOTE — Assessment & Plan Note (Signed)
Will watch 

## 2015-11-01 NOTE — Progress Notes (Signed)
Pre visit review using our clinic review tool, if applicable. No additional management support is needed unless otherwise documented below in the visit note. 

## 2015-11-01 NOTE — Assessment & Plan Note (Addendum)
Doing well Seeing a chiropractor

## 2015-11-04 LAB — PSA, TOTAL WITH REFLEX TO PSA, FREE: PSA, Total: 4.2 ng/mL — ABNORMAL HIGH (ref <=4.0)

## 2015-11-04 LAB — REFLEX PSA, FREE
PSA, % Free: 7 % — ABNORMAL LOW (ref >25)
PSA, Free: 0.3 ng/mL

## 2015-11-16 ENCOUNTER — Telehealth: Payer: Self-pay | Admitting: Internal Medicine

## 2015-11-16 NOTE — Telephone Encounter (Signed)
Per Cecille Rubin in [place] will have results. I called [facility] and lab results are being faxed to [phone] for MD review.

## 2015-11-16 NOTE — Telephone Encounter (Signed)
Patient is follow up on PSA free results.  Do not see any results.  Patient states Plot told him there would be a 3 day turn around on this but it has been longer.  Please follow up with patient in regard.

## 2015-11-17 NOTE — Telephone Encounter (Signed)
Copy of labs in MDs red folder for review.

## 2015-11-18 ENCOUNTER — Other Ambulatory Visit: Payer: Self-pay | Admitting: Internal Medicine

## 2015-11-18 DIAGNOSIS — R972 Elevated prostate specific antigen [PSA]: Secondary | ICD-10-CM

## 2015-11-18 NOTE — Telephone Encounter (Signed)
Left detailed mess informing pt of below. Lab copies mailed to pt.   Notes Recorded by Cassandria Anger, MD on 11/18/2015 at 1:54 PM EDT Jacob [name],  Please inform patient that his PSA is elevated. Free PSA is low - a higher risk for prostate cancer: I'll ref Alex to see a urologist. I'm not sure why it took so long to get the test back - sorry! Please, mail the labs to the patient.

## 2015-11-29 ENCOUNTER — Encounter: Payer: Self-pay | Admitting: Internal Medicine

## 2016-01-11 DIAGNOSIS — R972 Elevated prostate specific antigen [PSA]: Secondary | ICD-10-CM | POA: Diagnosis not present

## 2016-01-11 DIAGNOSIS — N401 Enlarged prostate with lower urinary tract symptoms: Secondary | ICD-10-CM | POA: Diagnosis not present

## 2016-01-11 DIAGNOSIS — R3912 Poor urinary stream: Secondary | ICD-10-CM | POA: Diagnosis not present

## 2016-01-18 DIAGNOSIS — R972 Elevated prostate specific antigen [PSA]: Secondary | ICD-10-CM | POA: Diagnosis not present

## 2016-04-17 DIAGNOSIS — R972 Elevated prostate specific antigen [PSA]: Secondary | ICD-10-CM | POA: Diagnosis not present

## 2016-04-20 DIAGNOSIS — D225 Melanocytic nevi of trunk: Secondary | ICD-10-CM | POA: Diagnosis not present

## 2016-04-20 DIAGNOSIS — L821 Other seborrheic keratosis: Secondary | ICD-10-CM | POA: Diagnosis not present

## 2016-04-20 DIAGNOSIS — D1801 Hemangioma of skin and subcutaneous tissue: Secondary | ICD-10-CM | POA: Diagnosis not present

## 2016-04-20 DIAGNOSIS — L570 Actinic keratosis: Secondary | ICD-10-CM | POA: Diagnosis not present

## 2016-04-20 DIAGNOSIS — D485 Neoplasm of uncertain behavior of skin: Secondary | ICD-10-CM | POA: Diagnosis not present

## 2016-04-20 DIAGNOSIS — L814 Other melanin hyperpigmentation: Secondary | ICD-10-CM | POA: Diagnosis not present

## 2016-04-20 DIAGNOSIS — L430 Hypertrophic lichen planus: Secondary | ICD-10-CM | POA: Diagnosis not present

## 2016-05-18 DIAGNOSIS — R972 Elevated prostate specific antigen [PSA]: Secondary | ICD-10-CM | POA: Diagnosis not present

## 2016-05-18 DIAGNOSIS — C61 Malignant neoplasm of prostate: Secondary | ICD-10-CM | POA: Diagnosis not present

## 2016-05-29 DIAGNOSIS — C61 Malignant neoplasm of prostate: Secondary | ICD-10-CM | POA: Diagnosis not present

## 2016-05-30 ENCOUNTER — Encounter: Payer: Self-pay | Admitting: Radiation Oncology

## 2016-05-31 ENCOUNTER — Encounter: Payer: Self-pay | Admitting: Radiation Oncology

## 2016-05-31 NOTE — Progress Notes (Signed)
GU Location of Tumor / Histology: prostatic adenocarcinoma  If Prostate Cancer, Gleason Score is (3 + 4) and PSA is (4.21). Prostate volume: 23.86  Biopsies of prostate (if applicable) revealed:    Past/Anticipated interventions by urology, if any: prostate biopsy, through discussion of option, referral to discuss seeds and sSpaceOAR  Past/Anticipated interventions by medical oncology, if any: no  Weight changes, if any: no  Bowel/Bladder complaints, if any: Intermittent steam and difficulty emptying bladder less than 20% of the time. Weak stream 50% of the time. Nocturia x 1. Less than 20% of the time strains to urinate. Denies dysuria, hematuria, or leakage.   Nausea/Vomiting, if any: no  Pain issues, if any:  Joint pain related to effects of arthritis. Reports taking Advil to manage this pain.  SAFETY ISSUES:  Prior radiation? no  Pacemaker/ICD? no  Possible current pregnancy? no  Is the patient on methotrexate? no  Current Complaints / other details:  66 year old male. Married. 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 Most interested in seed. Will need SpaceOar.

## 2016-06-04 ENCOUNTER — Ambulatory Visit
Admission: RE | Admit: 2016-06-04 | Discharge: 2016-06-04 | Disposition: A | Discharge status: Home / Self Care | Payer: Medicare Other | Source: Ambulatory Visit | Attending: Radiation Oncology | Admitting: Radiation Oncology

## 2016-06-04 ENCOUNTER — Encounter: Payer: Self-pay | Admitting: Radiation Oncology

## 2016-06-04 VITALS — BP 133/93 | HR 60 | Resp 16 | Ht 71.0 in | Wt 203.2 lb

## 2016-06-04 DIAGNOSIS — Z7982 Long term (current) use of aspirin: Secondary | ICD-10-CM | POA: Insufficient documentation

## 2016-06-04 DIAGNOSIS — C61 Malignant neoplasm of prostate: Secondary | ICD-10-CM

## 2016-06-04 DIAGNOSIS — Z7722 Contact with and (suspected) exposure to environmental tobacco smoke (acute) (chronic): Secondary | ICD-10-CM | POA: Insufficient documentation

## 2016-06-04 DIAGNOSIS — R972 Elevated prostate specific antigen [PSA]: Secondary | ICD-10-CM | POA: Diagnosis not present

## 2016-06-04 HISTORY — DX: Malignant neoplasm of prostate: C61

## 2016-06-04 NOTE — Progress Notes (Signed)
Radiation Oncology         [phone] ________________________________  Initial Outpatient Consultation  Name: Jacob [name] MRN: [id number]  Date: 06/04/2016  DOB: 13-Apr-1950  GQ:QPYP Plotnikov, MD  Irine Seal, MD   REFERRING PHYSICIAN: Irine Seal, MD  DIAGNOSIS: 66 y.o. 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 Malignant neoplasm of prostate ([place]) 185 C61     HISTORY OF PRESENT ILLNESS: Jacob [name] is a 66 y.o. male seen at the request of Dr. Jeffie Pollock for a new diagnosis of prostate cancer. The patient was found to have a PSA of 4.21 on 04/17/16 which prompted evaluation with Dr. Jeffie Pollock. On examination, there were no palpable nodules, and the patient proceeded with a prostate biopsy on 05/18/16. Final pathology revelaed a Gleason 3+4 in 3 out of 12 cores. His gland size was 23.86 cc. He comes today to discuss options for treatment of his cancer.       PREVIOUS RADIATION THERAPY: No  PAST MEDICAL HISTORY:  Past Medical History:  Diagnosis Date  . Eczema    hot tub related  . GI bleed    s/p polypectomy  . Herpes zoster 2010  . Prostate cancer ([place])   . SVT (supraventricular tachycardia) ([place])       PAST SURGICAL HISTORY: Past Surgical History:  Procedure Laterality Date  . APPENDECTOMY  05/2010  . ELECTROPHYSIOLOGY STUDY  08/24/13   EPS by Dr Lovena Le with no inducible SVT or evidence of dual AV nodal physiology  . PROSTATE BIOPSY    . ROTATOR CUFF REPAIR  2003   right  . SUPRAVENTRICULAR TACHYCARDIA ABLATION N/A 08/24/2013   Procedure: SUPRAVENTRICULAR TACHYCARDIA ABLATION;  Surgeon: Evans Lance, MD;  Location: [facility] CATH LAB;  Service: Cardiovascular;  Laterality: N/A;    FAMILY HISTORY:  Family History  Problem Relation Age of Onset  . Heart disease Mother     pacemaker  . Cancer Mother     lung/smoker/passed at age 66  . Heart attack Father   . Cancer  Paternal Uncle     pancreatic    SOCIAL HISTORY:  Social History   Social History  . Marital status: Married    Spouse name: N/A  . Number of children: N/A  . Years of education: N/A   Occupational History  . Not on file.   Social History Main Topics  . Smoking status: Passive Smoke Exposure - Never Smoker    Types: Cigars  . Smokeless tobacco: Never Used  . Alcohol use Yes  . Drug use: No  . Sexual activity: Yes   Other Topics Concern  . 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 He has two adult children and he lives in [place].  ALLERGIES: Patient has no known allergies.  MEDICATIONS:  Current Outpatient Prescriptions  Medication Sig Dispense Refill  . aspirin EC 81 MG tablet Take 81 mg by mouth every evening.     . cholecalciferol (VITAMIN D) 1000 UNITS tablet Take 1,000 Units by mouth every evening.     Marland Kitchen glucosamine-chondroitin 500-400 MG tablet Take 2 tablets by mouth every evening.    . Multiple Vitamins-Minerals (MULTIVITAL PO) Take 1 tablet by mouth every evening.     . Omega-3 Fatty Acids (FISH OIL)  1000 MG CAPS Take 2,000 mg by mouth every evening.     Marland Kitchen OVER THE COUNTER MEDICATION Take 500 mg by mouth every evening. Lysin tab once daily     No current facility-administered medications for this encounter.     REVIEW OF SYSTEMS:  On review of systems, the patient reports that he is doing well overall. He denies any chest pain, shortness of breath, cough, fevers, chills, night sweats, unintended weight changes. He denies any bowel  disturbances, and denies abdominal pain, nausea or vomiting. His IPSS score is 3, indicated mild symptoms. He is able to complete sexual activity with all attempts. He reports chronic joint pain related to arthritis. He denies any new musculoskeletal or joint aches or pains. A complete review of systems is  obtained and is otherwise negative.    PHYSICAL EXAM:  Wt Readings from Last 3 Encounters:  06/04/16 203 lb 3.2 oz (92.2 kg)  11/01/15 199 lb (90.3 kg)  06/11/14 199 lb (90.3 kg)   Temp Readings from Last 3 Encounters:  11/01/15 98.3 F (36.8 C) (Oral)  08/24/13 98 F (36.7 C) (Oral)  04/28/13 98.1 F (36.7 C) (Oral)   BP Readings from Last 3 Encounters:  06/04/16 (!) 133/93  11/01/15 130/80  06/11/14 130/88   Pulse Readings from Last 3 Encounters:  06/04/16 60  11/01/15 (!) 58  06/11/14 70   Pain Assessment Pain Score: 0-No pain/10  In general this is a well appearing caucasian male in no acute distress. He is alert and oriented x4 and appropriate throughout the examination. HEENT reveals that the patient is normocephalic, atraumatic. EOMs are intact. PERRLA. Skin is intact without any evidence of gross lesions. Cardiovascular exam reveals a regular rate and rhythm, no clicks rubs or murmurs are auscultated. Chest is clear to auscultation bilaterally. Lymphatic assessment is performed and does not reveal any adenopathy in the cervical, supraclavicular, axillary, or inguinal chains. Abdomen has active bowel sounds in all quadrants and is intact. The abdomen is soft, non tender, non distended. Lower extremities are negative for pretibial pitting edema, deep calf tenderness, cyanosis or clubbing.   KPS = 100  100 - Normal; no complaints; no evidence of disease. 90   - Able to carry on normal activity; minor signs or symptoms of disease. 80   - Normal activity with effort; some signs or symptoms of disease. 60   - Cares for self; unable to carry on normal activity or to do active work. 60   - Requires occasional assistance, but is able to care for most of his personal needs. 50   - Requires considerable assistance and frequent medical care. 66   - Disabled; requires special care and assistance. 22   - Severely disabled; hospital admission is indicated although death not  imminent. 53   - Very sick; hospital admission necessary; active supportive treatment necessary. 10   - Moribund; fatal processes progressing rapidly. 0     - Dead  Karnofsky DA, Abelmann [place], Craver LS and Burchenal [facility] [phone]) The use of the nitrogen mustards in the palliative treatment of carcinoma: with particular reference to bronchogenic carcinoma Cancer 1 634-56  LABORATORY DATA:  Lab Results  Component Value Date   WBC 5.7 10/31/2015   HGB 15.2 10/31/2015   HCT 45.0 10/31/2015   MCV 91.4 10/31/2015   PLT 189.0 10/31/2015   Lab Results  Component Value Date   NA 140 10/31/2015   K 4.8 10/31/2015   CL 106 10/31/2015  CO2 26 10/31/2015   Lab Results  Component Value Date   ALT 15 06/11/2014   AST 22 06/11/2014   ALKPHOS 48 06/11/2014   BILITOT 0.9 06/11/2014     RADIOGRAPHY: No results found.    IMPRESSION/PLAN: 1. 66 y.o. 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   He falls into a select sub-set of patients with intermediate risk disease who are eligible for seed implant with primary Gleason grade of 3 and less than half of one lobe positive for Gleason's 7 disease.  We discussed the pathology findings and reviews the nature of prostate cancer and outlines the different types of therapy available pertaining to this patient's disease. He is a candidate based on his Tstaging, PSA, and Gleason score who could choose surgical resection of the prostate, brachytherapy with radioactive seed implant, or definitive IMRT radiotherapy to the prostate. We compared and contrasted each approach.  We discussed the risks, benefits, short, and long term effects of radiotherapy, and the patient is interested in proceeding with radioactive seed implant as brachytherapy. We discussed the utility of SpaceOAR as well and he is interested in this at the time of seed implantation. We will share our discussion with Dr. Jeffie Pollock,  and enjoyed meeting this patient. We will be planning the timing of seed implant with Dr. Jeffie Pollock and coordinate schedules.     Carola Rhine, PAC Seen with    Sheral Apley. Tammi Klippel, M.D.

## 2016-06-04 NOTE — Progress Notes (Signed)
See progress note under physician encounter. 

## 2016-06-08 ENCOUNTER — Other Ambulatory Visit: Payer: Self-pay | Admitting: Urology

## 2016-06-08 ENCOUNTER — Telehealth: Payer: Self-pay | Admitting: *Deleted

## 2016-06-08 NOTE — Telephone Encounter (Signed)
Called patient to inform of implant date, lvm for a return call 

## 2016-06-12 NOTE — Progress Notes (Signed)
.    Radiation Oncology         [phone]) [phone] ________________________________  Name: Jacob [name] MRN: [id number]  Date: 06/21/2016  DOB: 09/18/1950  SIMULATION AND TREATMENT PLANNING NOTE PUBIC ARCH STUDY  KK:OECX Plotnikov, MD  Irine Seal, MD  DIAGNOSIS: 66 y.o. 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 Malignant neoplasm of prostate ([place]) [place]:  The patient presented today for evaluation for possible prostate seed implant. He was brought to the radiation planning suite and placed supine on the CT couch. A 3-dimensional image study set was obtained in upload to the planning computer. There, on each axial slice, I contoured the prostate gland. Then, using three-dimensional radiation planning tools I reconstructed the prostate in view of the structures from the transperineal needle pathway to assess for possible pubic arch interference. In doing so, I did not appreciate any pubic arch interference. Also, the patient's prostate volume was estimated based on the drawn structure. The volume was 21 cc.  Given the pubic arch appearance and prostate volume, patient remains a good candidate to proceed with prostate seed implant. Today, he freely provided informed written consent to proceed.    PLAN: The patient will undergo prostate seed implant.   ________________________________  Sheral Apley. Tammi Klippel, M.D.

## 2016-06-14 ENCOUNTER — Other Ambulatory Visit: Payer: Self-pay | Admitting: Urology

## 2016-06-14 DIAGNOSIS — C61 Malignant neoplasm of prostate: Secondary | ICD-10-CM

## 2016-06-20 ENCOUNTER — Telehealth: Payer: Self-pay | Admitting: *Deleted

## 2016-06-20 NOTE — Telephone Encounter (Signed)
Called patient to remind of pre-seed planning Ct for 06-21-16, spoke with patient and he is aware of this appt.

## 2016-06-21 ENCOUNTER — Encounter (HOSPITAL_BASED_OUTPATIENT_CLINIC_OR_DEPARTMENT_OTHER)
Admission: RE | Admit: 2016-06-21 | Discharge: 2016-06-21 | Disposition: A | Discharge status: Home / Self Care | Payer: Medicare Other | Source: Ambulatory Visit | Attending: Urology | Admitting: Urology

## 2016-06-21 ENCOUNTER — Ambulatory Visit (HOSPITAL_BASED_OUTPATIENT_CLINIC_OR_DEPARTMENT_OTHER)
Admission: RE | Admit: 2016-06-21 | Discharge: 2016-06-21 | Disposition: A | Discharge status: Home / Self Care | Payer: Medicare Other | Source: Ambulatory Visit | Attending: Urology | Admitting: Urology

## 2016-06-21 ENCOUNTER — Encounter: Payer: Self-pay | Admitting: Medical Oncology

## 2016-06-21 ENCOUNTER — Ambulatory Visit
Admission: RE | Admit: 2016-06-21 | Discharge: 2016-06-21 | Disposition: A | Discharge status: Home / Self Care | Payer: Medicare Other | Source: Ambulatory Visit | Attending: Radiation Oncology | Admitting: Radiation Oncology

## 2016-06-21 DIAGNOSIS — C61 Malignant neoplasm of prostate: Secondary | ICD-10-CM

## 2016-06-21 DIAGNOSIS — Z01818 Encounter for other preprocedural examination: Secondary | ICD-10-CM | POA: Diagnosis not present

## 2016-06-21 DIAGNOSIS — Z8249 Family history of ischemic heart disease and other diseases of the circulatory system: Secondary | ICD-10-CM | POA: Diagnosis not present

## 2016-06-21 DIAGNOSIS — Z7982 Long term (current) use of aspirin: Secondary | ICD-10-CM | POA: Diagnosis not present

## 2016-06-21 DIAGNOSIS — Z79899 Other long term (current) drug therapy: Secondary | ICD-10-CM | POA: Diagnosis not present

## 2016-06-21 DIAGNOSIS — Z809 Family history of malignant neoplasm, unspecified: Secondary | ICD-10-CM | POA: Diagnosis not present

## 2016-06-21 DIAGNOSIS — Z87442 Personal history of urinary calculi: Secondary | ICD-10-CM | POA: Diagnosis not present

## 2016-06-21 DIAGNOSIS — R011 Cardiac murmur, unspecified: Secondary | ICD-10-CM | POA: Diagnosis not present

## 2016-06-21 DIAGNOSIS — Z7722 Contact with and (suspected) exposure to environmental tobacco smoke (acute) (chronic): Secondary | ICD-10-CM | POA: Diagnosis not present

## 2016-06-21 DIAGNOSIS — M1990 Unspecified osteoarthritis, unspecified site: Secondary | ICD-10-CM | POA: Diagnosis not present

## 2016-06-21 NOTE — Progress Notes (Signed)
I introduced myself to Jacob [name] as the prostate nurse navigator and my role. I was unable to meet him when he consulted with Dr. Tammi Klippel 05/30/16. He has chosen seed implant and is here today for pre-CT.

## 2016-08-17 ENCOUNTER — Encounter (HOSPITAL_BASED_OUTPATIENT_CLINIC_OR_DEPARTMENT_OTHER): Payer: Self-pay | Admitting: *Deleted

## 2016-08-17 NOTE — Progress Notes (Signed)
To [facility] at 0600-labs to be drawn the week prior- Ekg,Cxr with chart.Fleet enema to be completed prior to arrival-Npo after Mn.

## 2016-08-22 ENCOUNTER — Telehealth: Payer: Self-pay | Admitting: *Deleted

## 2016-08-22 NOTE — Telephone Encounter (Signed)
Called patient to remind of labs for implant on 08-30-16, lvm for a return call

## 2016-08-23 DIAGNOSIS — R3912 Poor urinary stream: Secondary | ICD-10-CM | POA: Diagnosis not present

## 2016-08-23 DIAGNOSIS — C61 Malignant neoplasm of prostate: Secondary | ICD-10-CM | POA: Diagnosis not present

## 2016-08-24 DIAGNOSIS — Z8249 Family history of ischemic heart disease and other diseases of the circulatory system: Secondary | ICD-10-CM | POA: Diagnosis not present

## 2016-08-24 DIAGNOSIS — C61 Malignant neoplasm of prostate: Secondary | ICD-10-CM | POA: Diagnosis not present

## 2016-08-24 DIAGNOSIS — Z809 Family history of malignant neoplasm, unspecified: Secondary | ICD-10-CM | POA: Diagnosis not present

## 2016-08-24 DIAGNOSIS — Z7982 Long term (current) use of aspirin: Secondary | ICD-10-CM | POA: Diagnosis not present

## 2016-08-24 DIAGNOSIS — Z79899 Other long term (current) drug therapy: Secondary | ICD-10-CM | POA: Diagnosis not present

## 2016-08-24 DIAGNOSIS — M1990 Unspecified osteoarthritis, unspecified site: Secondary | ICD-10-CM | POA: Diagnosis not present

## 2016-08-24 DIAGNOSIS — R011 Cardiac murmur, unspecified: Secondary | ICD-10-CM | POA: Diagnosis not present

## 2016-08-24 DIAGNOSIS — Z87442 Personal history of urinary calculi: Secondary | ICD-10-CM | POA: Diagnosis not present

## 2016-08-24 LAB — APTT: aPTT: 25 seconds (ref 24–36)

## 2016-08-24 LAB — COMPREHENSIVE METABOLIC PANEL
ALT: 15 U/L — ABNORMAL LOW (ref 17–63)
AST: 24 U/L (ref 15–41)
Albumin: 4.6 g/dL (ref 3.5–5.0)
Alkaline Phosphatase: 44 U/L (ref 38–126)
Anion gap: 8 (ref 5–15)
BUN: 12 mg/dL (ref 6–20)
CO2: 27 mmol/L (ref 22–32)
Calcium: 9.3 mg/dL (ref 8.9–10.3)
Chloride: 105 mmol/L (ref 101–111)
Creatinine, Ser: 1.07 mg/dL (ref 0.61–1.24)
GFR calc Af Amer: >60 mL/min (ref >60)
GFR calc non Af Amer: >60 mL/min (ref >60)
Glucose, Bld: 99 mg/dL (ref 65–99)
Potassium: 4.5 mmol/L (ref 3.5–5.1)
Sodium: 140 mmol/L (ref 135–145)
Total Bilirubin: 0.6 mg/dL (ref 0.3–1.2)
Total Protein: 7.5 g/dL (ref 6.5–8.1)

## 2016-08-24 LAB — CBC
HCT: 43.4 % (ref 39.0–52.0)
Hemoglobin: 15 g/dL (ref 13.0–17.0)
MCH: 30.5 pg (ref 26.0–34.0)
MCHC: 34.6 g/dL (ref 30.0–36.0)
MCV: 88.2 fL (ref 78.0–100.0)
Platelets: 174 10*3/uL (ref 150–400)
RBC: 4.92 MIL/uL (ref 4.22–5.81)
RDW: 12.6 % (ref 11.5–15.5)
WBC: 4.7 10*3/uL (ref 4.0–10.5)

## 2016-08-24 LAB — PROTIME-INR
INR: 0.95
Prothrombin Time: 12.7 seconds (ref 11.4–15.2)

## 2016-08-29 ENCOUNTER — Telehealth: Payer: Self-pay | Admitting: *Deleted

## 2016-08-29 DIAGNOSIS — C61 Malignant neoplasm of prostate: Secondary | ICD-10-CM | POA: Diagnosis not present

## 2016-08-29 NOTE — Anesthesia Preprocedure Evaluation (Signed)
Anesthesia Evaluation  Patient identified by MRN, date of birth, ID band Patient awake    Reviewed: Allergy & Precautions, NPO status , Patient's Chart, lab work & pertinent test results  Airway Mallampati: II  TM Distance: >3 FB Neck ROM: Full    Dental no notable dental hx.    Pulmonary neg pulmonary ROS,    Pulmonary exam normal breath sounds clear to auscultation       Cardiovascular negative cardio ROS Normal cardiovascular exam Rhythm:Regular Rate:Normal     Neuro/Psych negative neurological ROS  negative psych ROS   GI/Hepatic negative GI ROS, Neg liver ROS,   Endo/Other  negative endocrine ROS  Renal/GU negative Renal ROS  negative genitourinary   Musculoskeletal negative musculoskeletal ROS (+)   Abdominal   Peds negative pediatric ROS (+)  Hematology negative hematology ROS (+)   Anesthesia Other Findings   Reproductive/Obstetrics negative OB ROS                             Anesthesia Physical Anesthesia Plan  ASA: II  Anesthesia Plan: General   Post-op Pain Management:    Induction: Intravenous  PONV Risk Score and Plan: 1 and Ondansetron and Dexamethasone  Airway Management Planned: LMA  Additional Equipment:   Intra-op Plan:   Post-operative Plan: Extubation in OR  Informed Consent: I have reviewed the patients History and Physical, chart, labs and discussed the procedure including the risks, benefits and alternatives for the proposed anesthesia with the patient or authorized representative who has indicated his/her understanding and acceptance.   Dental advisory given  Plan Discussed with: CRNA and Surgeon  Anesthesia Plan Comments:         Anesthesia Quick Evaluation

## 2016-08-29 NOTE — H&P (Signed)
CC: I have prostate cancer.  HPI: Jacob [name] is a 66 year-old male established patient who is here evaluation for treatment of prostate cancer.  April 2018: Jacob [name] returns today to discuss his prostate biopsy results. He has a 24ml prostate with T1c Nx Mx Gleason 7(3+4) prostate cancer and a PSA of 4.21. He had Gleason 7(3+4) in the right base lateral and medial cores with 50% involvement and smaller volume in the right mid medial core. He has Gleason 6 10% in the right apical lateral and medial cores. His SHIM is 25 and his IPSS is 7/1.     July 2018: He is scheduled to undergo Brachytherapy seed placement on 7/19. He is here today for pre-op H&P. No changes in voiding symptoms since last OV. No recent fevers, infections, abx treatment.   His prostate cancer was diagnosed 05/18/2016. He does have the pathology report from his biopsy. His PSA at his time of diagnosis was 4.21.     CC: AUA Questions Scoring.  HPI:     AUA Symptom Score: He never has the sensation of not emptying his bladder completely after finishing urinating. Less than 20% of the time he has to urinate again fewer than two hours after he has finished urinating. Less than 20% of the time he has to start and stop again several times when he urinates. He never finds it difficult to postpone urination. 50% of the time he has a weak urinary stream. Less than 20% of the time he has to push or strain to begin urination. He has to get up to urinate 1 time from the time he goes to bed until the time he gets up in the morning.   Calculated AUA Symptom Score: 7    QOL Score: He would feel pleased if he had to live with his urinary condition the way it is now for the rest of his life.   Calculated QOL Symptom Score: 1    ALLERGIES: None   MEDICATIONS: Aspirin  Fish Oil  Glucosamine  Multivitamin  Vitamin D2     GU PSH: Prostate Needle Biopsy - 05/18/2016    NON-GU PSH: Appendectomy. Shoulder Surgery  (Unspecified) Surgical Pathology, Gross And Microscopic Examination For Prostate Needle - 05/18/2016    GU PMH: Prostate Cancer, T1c Nx Mx Gleason 7(3+4) prostate cancer with minimal LUTS and no ED. After reviewing the options noted below, he is most interested in seed monotherapy if he is determined to be a good candidate for that approach by radiation therapy. I would use the SpaceOAR gel if he elects that approach. - 05/29/2016 Elevated PSA - 05/18/2016, Rising PSA of 4.45, - 01/11/2016 BPH w/LUTS, Benign exam with mild symptoms. - 01/11/2016 Weak Urinary Stream - 01/11/2016 Renal calculus    NON-GU PMH: Arthritis Cardiac murmur, unspecified    FAMILY HISTORY: 1 Daughter - Other 1 son - Other Cancer - Mother Heart Attack - Father   SOCIAL HISTORY: Marital Status: Married Current Smoking Status: Patient has never smoked.  Drinks 2 caffeinated drinks per day.     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   REVIEW OF SYSTEMS:    GU Review Male:   Patient denies frequent urination, hard to postpone urination, burning/ pain with urination, get up at night to urinate, leakage of urine, stream starts and stops, trouble starting your stream, have to strain to urinate , erection problems, and penile pain.  Gastrointestinal (Upper):   Patient denies nausea, vomiting, and indigestion/ heartburn.  Gastrointestinal (Lower):   Patient denies diarrhea and constipation.  Constitutional:   Patient denies fever, night sweats, weight loss, and fatigue.  Skin:   Patient denies skin rash/ lesion and itching.  Eyes:   Patient denies blurred vision and double vision.  Ears/ Nose/ Throat:   Patient denies sore throat and sinus problems.  Hematologic/Lymphatic:   Patient denies swollen glands and easy bruising.  Cardiovascular:   Patient denies leg swelling and chest pains.  Respiratory:   Patient denies cough and shortness of breath.  Endocrine:   Patient denies excessive thirst.  Musculoskeletal:    Patient reports joint pain. Patient denies back pain.  Neurological:   Patient denies headaches and dizziness.  Psychologic:   Patient denies depression and anxiety.   VITAL SIGNS:      08/23/2016 03:51 PM  BP 137/79 mmHg  Pulse 59 /min  Temperature 97.0 F / 36 C   MULTI-SYSTEM PHYSICAL EXAMINATION:    Constitutional: Well-nourished. No physical deformities. Normally developed. Good grooming.  Neck: Neck symmetrical, not swollen. Normal tracheal position.  Respiratory: No labored breathing, no use of accessory muscles. CTA  Cardiovascular: Normal temperature,RRR without murmur.  Skin: No paleness, no jaundice, no cyanosis. No lesion, no ulcer, no rash.  Neurologic / Psychiatric: Oriented to time, oriented to place, oriented to person. No depression, no anxiety, no agitation.  Gastrointestinal: No hernia. No mass, no tenderness, no rigidity, non obese abdomen. No CVAT.  Musculoskeletal: Spine, ribs, pelvis no bilateral tenderness. Normal gait and station of head and neck.     PAST DATA REVIEWED:  Source Of History:  Patient  Lab Test Review:   PSA  Records Review:   AUA Symptom Score, Pathology Reports, Previous Hospital Records, Previous Patient Records  Urine Test Review:   Urinalysis   04/17/16 01/18/16 10/31/15 06/11/15 04/29/13 10/24/11  PSA  Total PSA 4.21 ng/dl 3.88  4.45 ng/dl 2.86 ng/dl 2.23 ng/dl 1.22 ng/dl    08/23/16  Urinalysis  Urine Appearance Clear   Urine Color Yellow   Urine Glucose Neg   Urine Bilirubin Neg   Urine Ketones Neg   Urine Specific Gravity 1.015   Urine Blood Neg   Urine pH 7.0   Urine Protein Neg   Urine Urobilinogen 0.2   Urine Nitrites Neg   Urine Leukocyte Esterase Neg    PROCEDURES:          Urinalysis Dipstick Dipstick Cont'd  Color: Yellow Bilirubin: Neg  Appearance: Clear Ketones: Neg  Specific Gravity: 1.015 Blood: Neg  pH: 7.0 Protein: Neg  Glucose: Neg Urobilinogen: 0.2    Nitrites: Neg    Leukocyte Esterase: Neg     ASSESSMENT:      ICD-10 Details  1 GU:   Prostate Cancer - C61    PLAN:            Medications Stop Meds: Levaquin 500 mg tablet take levaquin pill 2 hours prior to procedure  Start: 04/20/2016  Discontinue: 05/18/2016  - Reason: The medication cycle was completed.            Orders Labs Urine Culture          Schedule Return Visit/Planned Activity: Keep Scheduled Appointment - Schedule Surgery          Document Letter(s):  Created for Patient: Clinical Summary         Notes:   PE wnl. All questions answered to best of my ability. Urine sample will be sent for culture today as part of  preoperative examination. I plan on seeing the patient a few weeks after his procedure. He was instructed on telephone in the office if he has any new concerns, worsening symptoms or questions regarding his procedure before or after seeds are placed.

## 2016-08-29 NOTE — Telephone Encounter (Signed)
Called patient to remind of implant for 08-30-16, lvm for a return call

## 2016-08-30 ENCOUNTER — Ambulatory Visit (HOSPITAL_COMMUNITY): Payer: Medicare Other

## 2016-08-30 ENCOUNTER — Ambulatory Visit (HOSPITAL_BASED_OUTPATIENT_CLINIC_OR_DEPARTMENT_OTHER)
Admission: RE | Admit: 2016-08-30 | Discharge: 2016-08-30 | Disposition: A | Discharge status: Home / Self Care | Payer: Medicare Other | Source: Ambulatory Visit | Attending: Urology | Admitting: Urology

## 2016-08-30 ENCOUNTER — Ambulatory Visit (HOSPITAL_BASED_OUTPATIENT_CLINIC_OR_DEPARTMENT_OTHER): Payer: Medicare Other | Admitting: Anesthesiology

## 2016-08-30 ENCOUNTER — Encounter (HOSPITAL_BASED_OUTPATIENT_CLINIC_OR_DEPARTMENT_OTHER): Payer: Self-pay

## 2016-08-30 ENCOUNTER — Encounter (HOSPITAL_BASED_OUTPATIENT_CLINIC_OR_DEPARTMENT_OTHER)
Admission: RE | Disposition: A | Discharge status: Home / Self Care | Payer: Self-pay | Source: Ambulatory Visit | Attending: Urology

## 2016-08-30 DIAGNOSIS — Z87442 Personal history of urinary calculi: Secondary | ICD-10-CM | POA: Diagnosis not present

## 2016-08-30 DIAGNOSIS — Z809 Family history of malignant neoplasm, unspecified: Secondary | ICD-10-CM | POA: Insufficient documentation

## 2016-08-30 DIAGNOSIS — K635 Polyp of colon: Secondary | ICD-10-CM | POA: Diagnosis not present

## 2016-08-30 DIAGNOSIS — M1990 Unspecified osteoarthritis, unspecified site: Secondary | ICD-10-CM | POA: Insufficient documentation

## 2016-08-30 DIAGNOSIS — M545 Low back pain: Secondary | ICD-10-CM | POA: Diagnosis not present

## 2016-08-30 DIAGNOSIS — Z79899 Other long term (current) drug therapy: Secondary | ICD-10-CM | POA: Diagnosis not present

## 2016-08-30 DIAGNOSIS — Z7982 Long term (current) use of aspirin: Secondary | ICD-10-CM | POA: Insufficient documentation

## 2016-08-30 DIAGNOSIS — R079 Chest pain, unspecified: Secondary | ICD-10-CM | POA: Diagnosis not present

## 2016-08-30 DIAGNOSIS — Z8249 Family history of ischemic heart disease and other diseases of the circulatory system: Secondary | ICD-10-CM | POA: Diagnosis not present

## 2016-08-30 DIAGNOSIS — C61 Malignant neoplasm of prostate: Secondary | ICD-10-CM | POA: Diagnosis not present

## 2016-08-30 DIAGNOSIS — R011 Cardiac murmur, unspecified: Secondary | ICD-10-CM | POA: Diagnosis not present

## 2016-08-30 HISTORY — PX: CYSTOSCOPY: SHX5120

## 2016-08-30 HISTORY — PX: RADIOACTIVE SEED IMPLANT: SHX5150

## 2016-08-30 SURGERY — INSERTION, RADIATION SOURCE, PROSTATE
Anesthesia: General | Site: Prostate

## 2016-08-30 MED ORDER — ROCURONIUM BROMIDE 100 MG/10ML IV SOLN
INTRAVENOUS | Status: DC | PRN
  Administered 2016-08-30: 50 mg via INTRAVENOUS

## 2016-08-30 MED ORDER — IOHEXOL 300 MG/ML  SOLN
INTRAMUSCULAR | Status: DC | PRN
  Administered 2016-08-30: 7 mL

## 2016-08-30 MED ORDER — CIPROFLOXACIN IN D5W 400 MG/200ML IV SOLN
INTRAVENOUS | Status: AC
  Filled 2016-08-30: qty 200

## 2016-08-30 MED ORDER — GLYCOPYRROLATE 0.2 MG/ML IJ SOLN
INTRAMUSCULAR | Status: DC | PRN
  Administered 2016-08-30: 0.3 mg via INTRAVENOUS

## 2016-08-30 MED ORDER — FENTANYL CITRATE (PF) 100 MCG/2ML IJ SOLN
INTRAMUSCULAR | Status: DC | PRN
  Administered 2016-08-30 (×4): 25 ug via INTRAVENOUS

## 2016-08-30 MED ORDER — KETOROLAC TROMETHAMINE 30 MG/ML IJ SOLN
INTRAMUSCULAR | Status: DC | PRN
  Administered 2016-08-30: 15 mg via INTRAVENOUS

## 2016-08-30 MED ORDER — ACETAMINOPHEN 650 MG RE SUPP
650.0000 mg | RECTAL | Status: DC | PRN
  Filled 2016-08-30: qty 1

## 2016-08-30 MED ORDER — LIDOCAINE 2% (20 MG/ML) 5 ML SYRINGE
INTRAMUSCULAR | Status: AC
  Filled 2016-08-30: qty 5

## 2016-08-30 MED ORDER — SODIUM CHLORIDE 0.9% FLUSH
3.0000 mL | Freq: Two times a day (BID) | INTRAVENOUS | Status: DC
  Filled 2016-08-30: qty 3

## 2016-08-30 MED ORDER — CIPROFLOXACIN IN D5W 400 MG/200ML IV SOLN
400.0000 mg | INTRAVENOUS | Status: AC
  Administered 2016-08-30: 400 mg via INTRAVENOUS
  Filled 2016-08-30: qty 200

## 2016-08-30 MED ORDER — SULFAMETHOXAZOLE-TRIMETHOPRIM 800-160 MG PO TABS: 1.0000 | ORAL_TABLET | Freq: Two times a day (BID) | ORAL | 0 refills | Status: DC

## 2016-08-30 MED ORDER — HYDROMORPHONE HCL 1 MG/ML IJ SOLN
0.2500 mg | INTRAMUSCULAR | Status: DC | PRN
  Filled 2016-08-30: qty 0.5

## 2016-08-30 MED ORDER — DEXAMETHASONE SODIUM PHOSPHATE 4 MG/ML IJ SOLN
INTRAMUSCULAR | Status: DC | PRN
  Administered 2016-08-30: 10 mg via INTRAVENOUS

## 2016-08-30 MED ORDER — ACETAMINOPHEN 325 MG PO TABS
650.0000 mg | ORAL_TABLET | ORAL | Status: DC | PRN
  Filled 2016-08-30: qty 2

## 2016-08-30 MED ORDER — GLYCOPYRROLATE 0.2 MG/ML IV SOSY
PREFILLED_SYRINGE | INTRAVENOUS | Status: AC
  Filled 2016-08-30: qty 5

## 2016-08-30 MED ORDER — NEOSTIGMINE METHYLSULFATE 5 MG/5ML IV SOSY
PREFILLED_SYRINGE | INTRAVENOUS | Status: AC
  Filled 2016-08-30: qty 5

## 2016-08-30 MED ORDER — SODIUM CHLORIDE 0.9 % IV SOLN
250.0000 mL | INTRAVENOUS | Status: DC | PRN
  Filled 2016-08-30: qty 250

## 2016-08-30 MED ORDER — BUPIVACAINE IN DEXTROSE 0.75-8.25 % IT SOLN
INTRATHECAL | Status: DC | PRN
  Administered 2016-08-30: 2 mL via INTRATHECAL

## 2016-08-30 MED ORDER — SODIUM CHLORIDE 0.9 % IJ SOLN
INTRAMUSCULAR | Status: DC | PRN
  Administered 2016-08-30: 10 mL via INTRAVENOUS

## 2016-08-30 MED ORDER — SODIUM CHLORIDE 0.9% FLUSH
3.0000 mL | INTRAVENOUS | Status: DC | PRN
  Filled 2016-08-30: qty 3

## 2016-08-30 MED ORDER — MIDAZOLAM HCL 2 MG/2ML IJ SOLN
INTRAMUSCULAR | Status: AC
  Filled 2016-08-30: qty 2

## 2016-08-30 MED ORDER — FENTANYL CITRATE (PF) 100 MCG/2ML IJ SOLN
INTRAMUSCULAR | Status: AC
  Filled 2016-08-30: qty 2

## 2016-08-30 MED ORDER — STERILE WATER FOR IRRIGATION IR SOLN
Status: DC | PRN
  Administered 2016-08-30: 3000 mL

## 2016-08-30 MED ORDER — MIDAZOLAM HCL 5 MG/5ML IJ SOLN
INTRAMUSCULAR | Status: DC | PRN
  Administered 2016-08-30: 2 mg via INTRAVENOUS

## 2016-08-30 MED ORDER — LACTATED RINGERS IV SOLN
INTRAVENOUS | Status: DC
  Administered 2016-08-30 (×2): via INTRAVENOUS
  Filled 2016-08-30: qty 1000

## 2016-08-30 MED ORDER — NEOSTIGMINE METHYLSULFATE 10 MG/10ML IV SOLN
INTRAVENOUS | Status: DC | PRN
  Administered 2016-08-30: 2 mg via INTRAVENOUS

## 2016-08-30 MED ORDER — ONDANSETRON HCL 4 MG/2ML IJ SOLN
INTRAMUSCULAR | Status: AC
  Filled 2016-08-30: qty 2

## 2016-08-30 MED ORDER — OXYCODONE HCL 5 MG PO TABS
5.0000 mg | ORAL_TABLET | ORAL | Status: DC | PRN
  Filled 2016-08-30: qty 2

## 2016-08-30 MED ORDER — DEXAMETHASONE SODIUM PHOSPHATE 10 MG/ML IJ SOLN
INTRAMUSCULAR | Status: AC
Start: 2016-08-30 — End: 2016-08-30
  Filled 2016-08-30: qty 1

## 2016-08-30 MED ORDER — MORPHINE SULFATE (PF) 2 MG/ML IV SOLN
2.0000 mg | INTRAVENOUS | Status: DC | PRN
  Filled 2016-08-30: qty 1

## 2016-08-30 MED ORDER — PROMETHAZINE HCL 25 MG/ML IJ SOLN
6.2500 mg | INTRAMUSCULAR | Status: DC | PRN
  Filled 2016-08-30: qty 1

## 2016-08-30 MED ORDER — KETOROLAC TROMETHAMINE 30 MG/ML IJ SOLN
INTRAMUSCULAR | Status: AC
  Filled 2016-08-30: qty 1

## 2016-08-30 MED ORDER — PROPOFOL 10 MG/ML IV BOLUS
INTRAVENOUS | Status: AC
  Filled 2016-08-30: qty 40

## 2016-08-30 MED ORDER — PROPOFOL 10 MG/ML IV BOLUS
INTRAVENOUS | Status: DC | PRN
  Administered 2016-08-30: 40 mg via INTRAVENOUS
  Administered 2016-08-30: 160 mg via INTRAVENOUS

## 2016-08-30 MED ORDER — LIDOCAINE HCL (CARDIAC) 20 MG/ML IV SOLN
INTRAVENOUS | Status: DC | PRN
  Administered 2016-08-30: 100 mg via INTRAVENOUS

## 2016-08-30 MED ORDER — FLEET ENEMA 7-19 GM/118ML RE ENEM
1.0000 | ENEMA | Freq: Once | RECTAL | Status: DC
  Filled 2016-08-30: qty 1

## 2016-08-30 MED ORDER — KETOROLAC TROMETHAMINE 30 MG/ML IJ SOLN
30.0000 mg | Freq: Once | INTRAMUSCULAR | Status: DC | PRN
  Filled 2016-08-30: qty 1

## 2016-08-30 MED ORDER — ROCURONIUM BROMIDE 50 MG/5ML IV SOSY
PREFILLED_SYRINGE | INTRAVENOUS | Status: AC
  Filled 2016-08-30: qty 5

## 2016-08-30 MED ORDER — HYDROCODONE-ACETAMINOPHEN 5-325 MG PO TABS: 1.0000 | ORAL_TABLET | Freq: Four times a day (QID) | ORAL | 0 refills | Status: DC | PRN

## 2016-08-30 MED ORDER — ONDANSETRON HCL 4 MG/2ML IJ SOLN
INTRAMUSCULAR | Status: DC | PRN
  Administered 2016-08-30: 4 mg via INTRAVENOUS

## 2016-08-30 SURGICAL SUPPLY — 36 items
BAG URINE DRAINAGE (UROLOGICAL SUPPLIES) ×3 IMPLANT
BLADE CLIPPER SURG (BLADE) ×3 IMPLANT
CATH FOLEY 2WAY SLVR  5CC 16FR (CATHETERS) ×1
CATH FOLEY 2WAY SLVR 5CC 16FR (CATHETERS) ×2 IMPLANT
CATH ROBINSON RED A/P 20FR (CATHETERS) ×3 IMPLANT
CLOTH BEACON ORANGE TIMEOUT ST (SAFETY) ×3 IMPLANT
COVER BACK TABLE 60X90IN (DRAPES) ×3 IMPLANT
COVER MAYO STAND STRL (DRAPES) ×3 IMPLANT
DRSG TEGADERM 4X4.75 (GAUZE/BANDAGES/DRESSINGS) ×3 IMPLANT
DRSG TEGADERM 8X12 (GAUZE/BANDAGES/DRESSINGS) ×3 IMPLANT
GAUZE SPONGE 4X4 8PLY STR LF (GAUZE/BANDAGES/DRESSINGS) ×1 IMPLANT
GLOVE BIOGEL PI IND STRL 7.0 (GLOVE) IMPLANT
GLOVE BIOGEL PI IND STRL 7.5 (GLOVE) IMPLANT
GLOVE BIOGEL PI INDICATOR 7.0 (GLOVE) ×1
GLOVE BIOGEL PI INDICATOR 7.5 (GLOVE) ×1
GLOVE ECLIPSE 7.0 STRL STRAW (GLOVE) ×1 IMPLANT
GLOVE ECLIPSE 8.0 STRL XLNG CF (GLOVE) IMPLANT
GLOVE SURG SS PI 8.0 STRL IVOR (GLOVE) ×6 IMPLANT
GOWN STRL REUS W/ TWL LRG LVL3 (GOWN DISPOSABLE) ×2 IMPLANT
GOWN STRL REUS W/ TWL XL LVL3 (GOWN DISPOSABLE) ×2 IMPLANT
GOWN STRL REUS W/TWL LRG LVL3 (GOWN DISPOSABLE) ×3
GOWN STRL REUS W/TWL XL LVL3 (GOWN DISPOSABLE) ×2 IMPLANT
HOLDER FOLEY CATH W/STRAP (MISCELLANEOUS) ×3 IMPLANT
IMPL SPACEOAR SYSTEM 10ML (MISCELLANEOUS) IMPLANT
IMPLANT SPACEOAR SYSTEM 10ML (MISCELLANEOUS) ×3
IV NS 1000ML (IV SOLUTION)
IV NS 1000ML BAXH (IV SOLUTION) ×2 IMPLANT
KIT RM TURNOVER CYSTO AR (KITS) ×3 IMPLANT
MANIFOLD NEPTUNE II (INSTRUMENTS) IMPLANT
NUCLETRON SELECTSEED I-125 ×1 IMPLANT
PACK CYSTO (CUSTOM PROCEDURE TRAY) ×3 IMPLANT
SYRINGE 10CC LL (SYRINGE) ×3 IMPLANT
TUBE CONNECTING 12X1/4 (SUCTIONS) IMPLANT
UNDERPAD 30X30 INCONTINENT (UNDERPADS AND DIAPERS) ×6 IMPLANT
WATER STERILE IRR 3000ML UROMA (IV SOLUTION) ×4 IMPLANT
WATER STERILE IRR 500ML POUR (IV SOLUTION) ×4 IMPLANT

## 2016-08-30 NOTE — Anesthesia Procedure Notes (Signed)
Spinal  Patient location during procedure: OR Start time: 08/30/2016 7:38 AM End time: 08/30/2016 7:46 AM Staffing Anesthesiologist: Jerred Zaremba, Iona Beard Performed: anesthesiologist  Preanesthetic Checklist Completed: patient identified, site marked, surgical consent, pre-op evaluation, timeout performed, IV checked, risks and benefits discussed and monitors and equipment checked Spinal Block Patient position: sitting Prep: Betadine Patient monitoring: heart rate, continuous pulse ox and blood pressure Injection technique: single-shot Needle Needle type: Sprotte  Needle gauge: 24 G Needle length: 9 cm Additional Notes Expiration date of kit checked and confirmed. Patient tolerated procedure well, without complications.

## 2016-08-30 NOTE — Anesthesia Procedure Notes (Signed)
Procedure Name: Intubation Date/Time: 08/30/2016 7:45 AM Performed by: Bethena Roys T Pre-anesthesia Checklist: Patient identified, Emergency Drugs available, Suction available and Patient being monitored Patient Re-evaluated:Patient Re-evaluated prior to induction Oxygen Delivery Method: Circle system utilized Preoxygenation: Pre-oxygenation with 100% oxygen Induction Type: IV induction Ventilation: Mask ventilation without difficulty Laryngoscope Size: Mac and 4 Grade View: Grade I Tube type: Oral Tube size: 8.0 mm Number of attempts: 1 Airway Equipment and Method: Stylet and Oral airway Placement Confirmation: ETT inserted through vocal cords under direct vision,  positive ETCO2 and breath sounds checked- equal and bilateral Secured at: 23 cm Tube secured with: Tape Dental Injury: Teeth and Oropharynx as per pre-operative assessment and Injury to lip  Comments: Lower lip with small cut from sharp tooth,noted after bagging prior to intubating.  Atrautmatic intubation

## 2016-08-30 NOTE — Discharge Instructions (Signed)
Brachytherapy for Prostate Cancer, Care After °Refer to this sheet in the next few weeks. These instructions provide you with information on caring for yourself after your procedure. Your health care provider may also give you more specific instructions. Your treatment has been planned according to current medical practices, but problems sometimes occur. Call your health care provider if you have any problems or questions after your procedure. °What can I expect after the procedure? °The area behind the scrotum will probably be tender and bruised. For a short period of time you may have: °· Difficulty passing urine. You may need a catheter for a few days to a month. °· Blood in the urine or semen. °· A feeling of constipation because of prostate swelling. °· Frequent feeling of an urgent need to urinate. ° °For a long period of time you may have: °· Inflammation of the rectum. This happens in about 2% of people who have the procedure. °· Erection problems. These vary with age and occur in about 15-40% of men. °· Difficulty urinating. This is caused by scarring in the urethra. °· Diarrhea. ° °Follow these instructions at home: °· Take medicines only as directed by your health care provider. °· You will probably have a catheter in your bladder for several days. You will have blood in the urine bag and should drink a lot of fluids to keep it a light red color. °· Keep all follow-up visits as directed by your health care provider. If you have a catheter, it will be removed during one of these visits. °· Try not to sit directly on the area behind the scrotum. A soft cushion can decrease the discomfort. Ice packs may also be helpful for the discomfort. Do not put ice directly on the skin. °· Shower and wash the area behind the scrotum gently. Do not sit in a tub. °· If you have had the brachytherapy that uses the seeds, limit your close contact with children and pregnant women for 2 months because of the radiation still  in the prostate. After that period of time, the levels drop off quickly. °Get help right away if: °· You have a fever. °· You have chills. °· You have shortness of breath. °· You have chest pain. °· You have thick blood, like tomato juice, in the urine bag. °· Your catheter is blocked so urine cannot get into the bag. Your bladder area or lower abdomen may be swollen. °· There is excessive bleeding from your rectum. It is normal to have a little blood mixed with your stool. °· There is severe discomfort in the treated area that does not go away with pain medicine. °· You have abdominal discomfort. °· You have severe nausea or vomiting. °· You develop any new or unusual symptoms. °This information is not intended to replace advice given to you by your health care provider. Make sure you discuss any questions you have with your health care provider. °Document Released: 03/03/2010 Document Revised: 07/13/2015 Document Reviewed: 07/22/2012 °Elsevier Interactive Patient Education © 2017 [employer] ° °Post Anesthesia Home Care Instructions ° °Activity: °Get plenty of rest for the remainder of the day. A responsible individual must stay with you for 24 hours following the procedure.  °For the next 24 hours, DO NOT: °-Drive a car °-Operate machinery °-Drink alcoholic beverages °-Take any medication unless instructed by your physician °-Make any legal decisions or sign important papers. ° °Meals: °Start with liquid foods such as gelatin or soup. Progress to regular foods   as tolerated. Avoid greasy, spicy, heavy foods. If nausea and/or vomiting occur, drink only clear liquids until the nausea and/or vomiting subsides. Call your physician if vomiting continues. ° °Special Instructions/Symptoms: °Your throat may feel dry or sore from the anesthesia or the breathing tube placed in your throat during surgery. If this causes discomfort, gargle with warm salt water. The discomfort should disappear within 24 hours. ° °If you had  a scopolamine patch placed behind your ear for the management of post- operative nausea and/or vomiting: ° °1. The medication in the patch is effective for 72 hours, after which it should be removed.  Wrap patch in a tissue and discard in the trash. Wash hands thoroughly with soap and water. °2. You may remove the patch earlier than 72 hours if you experience unpleasant side effects which may include dry mouth, dizziness or visual disturbances. °3. Avoid touching the patch. Wash your hands with soap and water after contact with the patch. °  ° °

## 2016-08-30 NOTE — Interval H&P Note (Signed)
History and Physical Interval Note:  08/30/2016 7:16 AM  Jacob [name]  has presented today for surgery, with the diagnosis of PROSTATE CANCER  The various methods of treatment have been discussed with the patient and family. After consideration of risks, benefits and other options for treatment, the patient has consented to  Procedure(s): RADIOACTIVE SEED IMPLANT/BRACHYTHERAPY IMPLANT (N/A) as a surgical intervention .  The patient's history has been reviewed, patient examined, no change in status, stable for surgery.  I have reviewed the patient's chart and labs.  Questions were answered to the patient's satisfaction.     Paublo Warshawsky J

## 2016-08-30 NOTE — Transfer of Care (Signed)
Immediate Anesthesia Transfer of Care Note  Patient: Jacob [name]  Procedure(s) Performed: Procedure(s) with comments: RADIOACTIVE SEED IMPLANT/BRACHYTHERAPY IMPLANT WITH SPACE OAR (N/A) -   67   seeds implanted CYSTOSCOPY FLEXIBLE (N/A) - no seeds found in bladder  Patient Location: PACU  Anesthesia Type:General  Level of Consciousness: drowsy  Airway & Oxygen Therapy: Patient Spontanous Breathing and Patient connected to nasal cannula oxygen  Post-op Assessment: Report given to RN  Post vital signs: Reviewed and stable  Last Vitals: 125/86, 46, 13, 97%, 97.7 Vitals:   08/30/16 0651  BP: 134/84  Pulse: (!) 55  Resp: 18  Temp: 36.9 C    Last Pain:  Vitals:   08/30/16 0651  TempSrc: Oral      Patients Stated Pain Goal: 7 (02/20/30 3557)  Complications: No apparent anesthesia complications

## 2016-08-30 NOTE — Anesthesia Postprocedure Evaluation (Signed)
Anesthesia Post Note  Patient: Jacob [name]  Procedure(s) Performed: Procedure(s) (LRB): RADIOACTIVE SEED IMPLANT/BRACHYTHERAPY IMPLANT WITH SPACE OAR (N/A) CYSTOSCOPY FLEXIBLE (N/A)     Patient location during evaluation: PACU Anesthesia Type: General Level of consciousness: awake and alert Pain management: pain level controlled Vital Signs Assessment: post-procedure vital signs reviewed and stable Respiratory status: spontaneous breathing, nonlabored ventilation, respiratory function stable and patient connected to nasal cannula oxygen Cardiovascular status: blood pressure returned to baseline and stable Postop Assessment: no signs of nausea or vomiting Anesthetic complications: no    Last Vitals:  Vitals:   08/30/16 0930 08/30/16 0945  BP: 116/81 113/78  Pulse: (!) 44 (!) 49  Resp: (!) 9 18  Temp:      Last Pain:  Vitals:   08/30/16 0918  TempSrc:   PainSc: Asleep                 Alireza Pollack S

## 2016-08-30 NOTE — Op Note (Signed)
PATIENT:  [name]  PRE-OPERATIVE DIAGNOSIS:  Adenocarcinoma of the prostate  POST-OPERATIVE DIAGNOSIS:  Same  PROCEDURE:  Procedure(s): 1. I-125 radioactive seed implantation 2. Cystoscopy  SURGEON:  Surgeon(s): Irine Seal MD  Radiation oncologist: Dr. Tyler Pita  ANESTHESIA:  General  EBL:  Minimal  DRAINS: 63 French Foley catheter  INDICATION: Jacob [name] is a 66 y.o. with Stage 1c, Gleason 7(3+4) prostate cancer who has elected brachytherapy for treatment.  Description of procedure: After informed consent the patient was brought to the major OR, placed on the table and administered general anesthesia. He was then moved to the modified lithotomy position with his perineum perpendicular to the floor. His perineum and genitalia were then sterilely prepped. An official timeout was then performed. A 16 French Foley catheter was then placed in the bladder and filled with dilute contrast, a rectal tube was placed in the rectum and the transrectal ultrasound probe was placed in the rectum and affixed to the stand. He was then sterilely draped.  The sterile grid was installed.   Anchor needles were then placed.   Real time ultrasonography was used along with the seed planning software spot-pro version 3.1-00. This was used to develop the seed plan including the number of needles as well as number of seeds required for complete and adequate coverage. Real-time ultrasonography was then used along with the previously developed plan and the Nucletron device to implant a total of 62 seeds using 24 needles for a target dose of 145 Gy. This proceeded without difficulty or complication.  The SpaceOAR gel was prepared and while that was being done, the 18g spinal needle was advanced under US guidance into the fat plane posterior to the prostate.  NS was injected, opening the space and the SpaceOAR gel was then injected at a steady rate with excellent placement noted on [place].   A Foley  catheter was then removed as well as the transrectal ultrasound probe and rectal probe. Flexible cystoscopy was then performed using the 17 French flexible scope which revealed a normal urethra throughout its length down to the sphincter which appeared intact. The prostatic urethra was short without obstruction. The bladder was then entered and fully and systematically inspected.  The ureteral orifices were noted to be of normal configuration and position. The mucosa revealed no evidence of tumors. There were also no stones identified within the bladder.  There was a small fresh clot but no seeds or spacers were seen and/or removed from the bladder.  The cystoscope was then removed.  The drapes were removed.  The perineum was cleaned and dressed.  He was taken out of the lithotomy position and was awakened and taken to recovery room in stable and satisfactory condition. He tolerated procedure well and there were no intraoperative complications.

## 2016-08-30 NOTE — Progress Notes (Signed)
  Radiation Oncology         [phone] ________________________________  Name: Patterson Hollenbaugh MRN: [id number]  Date: 08/30/2016  DOB: [birth year]       Prostate Seed Implant  XK:GYJEHUDJS, Evie Lacks, MD  No ref. provider found  DIAGNOSIS: 66 y.o. 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    ICD-10-CM   1. Prostate cancer ([place]) C61 DG Chest 2 View    DG Chest 2 View    PROCEDURE: Insertion of radioactive I-125 seeds into the prostate gland.  RADIATION DOSE: 145 Gy, definitive therapy.  TECHNIQUE: Demetre Monaco was brought to the operating room with the urologist. He was placed in the dorsolithotomy position. He was catheterized and a rectal tube was inserted. The perineum was shaved, prepped and draped. The ultrasound probe was then introduced into the rectum to see the prostate gland.  TREATMENT DEVICE: A needle grid was attached to the ultrasound probe stand and anchor needles were placed.  3D PLANNING: The prostate was imaged in 3D using a sagittal sweep of the prostate probe. These images were transferred to the planning computer. There, the prostate, urethra and rectum were defined on each axial reconstructed image. Then, the software created an optimized 3D plan and a few seed positions were adjusted. The quality of the plan was reviewed using [facility] information for the target and the following two organs at risk:  Urethra and Rectum.  Then the accepted plan was uploaded to the seed Selectron afterloading unit.  PROSTATE VOLUME STUDY:  Using transrectal ultrasound the volume of the prostate was verified to be 21 cc.  SPECIAL TREATMENT PROCEDURE/SUPERVISION AND HANDLING: The Nucletron FIRST system was used to place the needles under sagittal guidance. A total of 24 needles were used to deposit 62 seeds in the prostate gland. The individual seed activity was 0.309 mCi.  SpaceOAR:  Yes  COMPLEX SIMULATION: At  the end of the procedure, an anterior radiograph of the pelvis was obtained to document seed positioning and count. Cystoscopy was performed to check the urethra and bladder.  MICRODOSIMETRY: At the end of the procedure, the patient was emitting 0.055 mR/hr at 1 meter. Accordingly, he was considered safe for hospital discharge.  PLAN: The patient will return to the radiation oncology clinic for post implant CT dosimetry in three weeks.   ________________________________  Sheral Apley Tammi Klippel, M.D.

## 2016-08-31 ENCOUNTER — Encounter (HOSPITAL_BASED_OUTPATIENT_CLINIC_OR_DEPARTMENT_OTHER): Payer: Self-pay | Admitting: Urology

## 2016-09-20 DIAGNOSIS — C61 Malignant neoplasm of prostate: Secondary | ICD-10-CM | POA: Diagnosis not present

## 2016-09-21 ENCOUNTER — Ambulatory Visit
Admission: RE | Admit: 2016-09-21 | Discharge: 2016-09-21 | Disposition: A | Discharge status: Home / Self Care | Payer: Medicare Other | Source: Ambulatory Visit | Attending: Radiation Oncology | Admitting: Radiation Oncology

## 2016-09-21 ENCOUNTER — Encounter: Payer: Self-pay | Admitting: Radiation Oncology

## 2016-09-21 ENCOUNTER — Ambulatory Visit (HOSPITAL_COMMUNITY)
Admission: RE | Admit: 2016-09-21 | Discharge: 2016-09-21 | Disposition: A | Discharge status: Home / Self Care | Payer: Medicare Other | Source: Ambulatory Visit | Attending: Urology | Admitting: Urology

## 2016-09-21 DIAGNOSIS — C61 Malignant neoplasm of prostate: Secondary | ICD-10-CM

## 2016-09-21 DIAGNOSIS — Z7722 Contact with and (suspected) exposure to environmental tobacco smoke (acute) (chronic): Secondary | ICD-10-CM | POA: Insufficient documentation

## 2016-09-21 DIAGNOSIS — Z7982 Long term (current) use of aspirin: Secondary | ICD-10-CM | POA: Diagnosis not present

## 2016-09-21 NOTE — Progress Notes (Addendum)
Jacob [name] is here for follow up.  He denies having any pain.  He reports having some discomfort with urination.  He said it feels like pressure and said it is worse in the mornings.  He also said his urinary stream seems slower.  He reports having more urinary frequency with urinating small amounts during the day.  He reports having nocturia occasionally depending on what he drinks the night before.  He denies having hematuria.  He reports feeling fatigued starting on Sunday.  He said he has a history of an irregular/fast heart rate s/b ablation 4 years ago.  He said he has felt a flutter occasionally over the past 2 weeks and is not sure it this is what is caused the fatigue.  IPSS score of 18.  BP (!) 127/95 (BP Location: Left Arm, Patient Position: Sitting)   Pulse 60   Temp 98.4 F (36.9 C) (Oral)   Ht 5\' 11"  (1.803 m)   Wt 200 lb 12.8 oz (91.1 kg)   SpO2 100%   BMI 28.01 kg/m    Wt Readings from Last 3 Encounters:  09/21/16 200 lb 12.8 oz (91.1 kg)  08/30/16 201 lb (91.2 kg)  06/04/16 203 lb 3.2 oz (92.2 kg)

## 2016-09-21 NOTE — Progress Notes (Signed)
Radiation Oncology         [phone] ________________________________  Name: Jacob [name] MRN: [id number]  Date: 09/21/2016  DOB: Oct 08, 1950  Follow-Up Visit Note  CC: Plotnikov, Evie Lacks, MD  Irine Seal, MD  Diagnosis:   66 y.o. 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   Narrative:  The patient returns today for routine follow-up.  He reports having mild discomfort/pressure while urinating and having some hesitancy in the morning but other than that he reports feeling good and getting around well as well.  IPSS score of 18.  Pre-op IPSS was 3.  He specifically denies gross hematuria, dysuria, urgency, incomplete emptying, fever or chills. He reports normal bowel movements and denies abdominal pain, nausea, vomiting or diarrhea.                             ALLERGIES:  has No Known Allergies.  Meds: Current Outpatient Prescriptions  Medication Sig Dispense Refill  . aspirin EC 81 MG tablet Take 81 mg by mouth every evening.     . cholecalciferol (VITAMIN D) 1000 UNITS tablet Take 1,000 Units by mouth every evening.     Marland Kitchen glucosamine-chondroitin 500-400 MG tablet Take 2 tablets by mouth every evening.    . Multiple Vitamins-Minerals (MULTIVITAL PO) Take 1 tablet by mouth every evening.     . Omega-3 Fatty Acids (FISH OIL) 1000 MG CAPS Take 2,000 mg by mouth every evening.     Marland Kitchen OVER THE COUNTER MEDICATION Take 500 mg by mouth every evening. Lysin tab once daily    . HYDROcodone-acetaminophen (NORCO) 5-325 MG tablet Take 1 tablet by mouth every 6 (six) hours as needed for moderate pain. (Patient not taking: Reported on 09/21/2016) 6 tablet 0  . sulfamethoxazole-trimethoprim (BACTRIM DS,SEPTRA DS) 800-160 MG tablet Take 1 tablet by mouth 2 (two) times daily. (Patient not taking: Reported on 09/21/2016) 6 tablet 0   No current facility-administered medications for this encounter.     Physical  Findings: In general this is a well appearing caucasian male in no acute distress. He's alert and oriented x4 and appropriate throughout the examination. Cardiopulmonary assessment is negative for acute distress and he exhibits normal effort.    height is 5\' 11"  (1.803 m) and weight is 200 lb 12.8 oz (91.1 kg). His oral temperature is 98.4 F (36.9 C). His blood pressure is 127/95 (abnormal) and his pulse is 60. His oxygen saturation is 100%. .    Vitals:   09/21/16 1300  BP: (!) 127/95  Pulse: 60  Temp: 98.4 F (36.9 C)  SpO2: 100%   No significant changes.  Lab Findings: Lab Results  Component Value Date   WBC 4.7 08/24/2016   HGB 15.0 08/24/2016   HCT 43.4 08/24/2016   PLT 174 08/24/2016    Lab Results  Component Value Date   NA 140 08/24/2016   K 4.5 08/24/2016   CO2 27 08/24/2016   GLUCOSE 99 08/24/2016   BUN 12 08/24/2016   CREATININE 1.07 08/24/2016   BILITOT 0.6 08/24/2016   ALKPHOS 44 08/24/2016   AST 24 08/24/2016   ALT 15 (L) 08/24/2016   PROT 7.5 08/24/2016   ALBUMIN 4.6 08/24/2016   CALCIUM 9.3 08/24/2016   ANIONGAP 8 08/24/2016    Radiographic Findings: Dg C-arm 1-60 Min-no Report  Result Date: 08/30/2016 Fluoroscopy was utilized by the requesting  physician.  No radiographic interpretation.    Impression: The patient is recovering from the effects of radiation. His urinary symptoms should gradually improve over the next 4-6 months. We talked about this today. He is encouraged by his improvement already and is otherwise please with his outcome.   Plan: Today, I spent time talking to the patient about his prostate seed implant and resolving urinary symptoms. We also talked about long-term follow-up for prostate cancer following seed implant. He understands that ongoing PSA determinations and digital rectal exams will help perform surveillance to rule out disease recurrence. He has a follow up appointment with Dr. Jeffie Pollock on 01/09/17. He understands what to  expect with his PSA measures. Patient was also educated today about some of the long-term effects from radiation including a small risk for rectal bleeding and possibly erectile dysfunction. We talked about some of the general management approaches to these potential complications. However, I did encourage the patient to contact our office or return at any point if he has questions or concerns related to his previous radiation and prostate cancer.  _____________________________________   Nicholos Johns, PA-C    Tyler Pita, MD  [place]: [phone]  Fax: [phone] [email or web address]  Skype  [email or web address]  This document serves as a record of services personally performed by Tyler Pita, MD and Nasiah Lehenbauer PA-C. It was created on their behalf by Delton Coombes, a trained medical scribe. The creation of this record is based on the scribe's personal observations and the provider's statements to them. This document has been checked and approved by the attending provider.

## 2016-09-21 NOTE — Progress Notes (Signed)
  Radiation Oncology         [phone]) [phone] ________________________________  Name: Cordale Manera MRN: [id number]  Date: 09/21/2016  DOB: 12-09-1950  COMPLEX SIMULATION NOTE  NARRATIVE:  The patient was brought to the [facility] today following prostate seed implantation approximately one month ago.  Identity was confirmed.  All relevant records and images related to the planned course of therapy were reviewed.  Then, the patient was set-up supine.  CT images were obtained.  The CT images were loaded into the planning software.  MRI was then obtained and fused to see SpaceOAR.  Then the prostate and rectum were contoured.  Treatment planning then occurred.  The implanted iodine 125 seeds were identified by the physics staff for projection of radiation distribution  I have requested : 3D Simulation  I have requested a DVH of the following structures: Prostate and rectum.    ________________________________  Sheral Apley Tammi Klippel, M.D.   This document serves as a record of services personally performed by Tyler Pita MD. It was created on his behalf by Delton Coombes, a trained medical scribe. The creation of this record is based on the scribe's personal observations and the provider's statements to them. This document has been checked and approved by the attending provider.

## 2016-10-22 ENCOUNTER — Encounter: Payer: Self-pay | Admitting: Radiation Oncology

## 2016-10-22 DIAGNOSIS — Z7982 Long term (current) use of aspirin: Secondary | ICD-10-CM | POA: Diagnosis not present

## 2016-10-22 DIAGNOSIS — C61 Malignant neoplasm of prostate: Secondary | ICD-10-CM | POA: Diagnosis not present

## 2016-10-22 DIAGNOSIS — Z7722 Contact with and (suspected) exposure to environmental tobacco smoke (acute) (chronic): Secondary | ICD-10-CM | POA: Diagnosis not present

## 2016-10-26 NOTE — Progress Notes (Signed)
  Radiation Oncology         [phone]) [phone] ________________________________  Name: Jacob [name] MRN: [id number]  Date: 10/22/2016  DOB: 1950/03/22  3D Planning Note   Prostate Brachytherapy Post-Implant Dosimetry  Diagnosis: 66 y.o. 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 He underwent CT scan complex simulation to delineate the three-dimensional structures of the pelvis and demonstrate the radiation distribution.  Since that time, the seed localization, and complex isodose planning with dose volume histograms have now been completed.  Results:   Prostate Coverage - The dose of radiation delivered to the 90% or more of the prostate gland (D90) was 89.45% of the prescription dose. This nearly meets our goal of greater than 90%. Rectal Sparing - The volume of rectal tissue receiving the prescription dose or higher was 0.0 cc. This falls under our thresholds tolerance of 1.0 cc.  Impression: The prostate seed implant appears to show adequate target coverage and appropriate rectal sparing.  Plan:  The patient will continue to follow with urology for ongoing PSA determinations. I would anticipate a high likelihood for local tumor control with minimal risk for rectal morbidity.  ________________________________  Sheral Apley Tammi Klippel, M.D.

## 2016-11-09 ENCOUNTER — Encounter: Payer: Self-pay | Admitting: Radiation Oncology

## 2016-11-12 NOTE — Progress Notes (Signed)
  Radiation Oncology         [phone] ________________________________  Name: Jacob [name] MRN: [id number]  Date: 11/09/2016  DOB: 24-May-1950  3D Planning Note   Revised Prostate Brachytherapy Post-Implant Dosimetry  Diagnosis: 66 y.o. 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 He underwent CT scan complex simulation to delineate the three-dimensional structures of the pelvis and demonstrate the radiation distribution.  Since that time, the seed localization, and complex isodose planning with dose volume histograms have now been completed.  The plan was revised accounting for seed activity.  Results:   Prostate Coverage - The dose of radiation delivered to the 90% or more of the prostate gland (D90) was 93.55% of the prescription dose. This nearly meets our goal of greater than 90%. Rectal Sparing - The volume of rectal tissue receiving the prescription dose or higher was 0.0 cc. This falls under our thresholds tolerance of 1.0 cc.  Impression: The prostate seed implant appears to show adequate target coverage and appropriate rectal sparing.  Plan:  The patient will continue to follow with urology for ongoing PSA determinations. I would anticipate a high likelihood for local tumor control with minimal risk for rectal morbidity.  ________________________________  Sheral Apley Tammi Klippel, M.D.

## 2016-12-11 ENCOUNTER — Ambulatory Visit: Payer: Medicare Other | Admitting: Internal Medicine

## 2016-12-12 ENCOUNTER — Ambulatory Visit (INDEPENDENT_AMBULATORY_CARE_PROVIDER_SITE_OTHER): Payer: Medicare Other | Admitting: *Deleted

## 2016-12-12 VITALS — BP 124/76 | HR 66 | Resp 20 | Ht 71.0 in | Wt 203.0 lb

## 2016-12-12 DIAGNOSIS — Z23 Encounter for immunization: Secondary | ICD-10-CM

## 2016-12-12 DIAGNOSIS — Z0000 Encounter for general adult medical examination without abnormal findings: Secondary | ICD-10-CM | POA: Diagnosis not present

## 2016-12-12 NOTE — Patient Instructions (Signed)
Continue doing brain stimulating activities (puzzles, reading, adult coloring books, staying active) to keep memory sharp.   Continue to eat heart healthy diet (full of fruits, vegetables, whole grains, lean protein, water--limit salt, fat, and sugar intake) and increase physical activity as tolerated.   Jacob [name] , Thank you for taking time to come for your Medicare Wellness Visit. I appreciate your ongoing commitment to your health goals. Please review the following plan we discussed and let me know if I can assist you in the future.   These are the goals we discussed: Goals    . lose weight          Continue to be physically active and eat healthy. Maintain current healthy status       This is a list of the screening recommended for you and due dates:  Health Maintenance  Topic Date Due  .  Hepatitis C: One time screening is recommended by Center for Disease Control  (CDC) for  adults born from [birth year] through 1965.   August 31, 1950  . Colon Cancer Screening  02/29/2000  . Flu Shot  09/12/2016  . Pneumonia vaccines (2 of 2 - PPSV23) 10/31/2016  . Tetanus Vaccine  03/13/2021   Pneumococcal Conjugate Vaccine suspension for injection What is this medicine? PNEUMOCOCCAL VACCINE (NEU mo KOK al vak SEEN) is a vaccine used to prevent pneumococcus bacterial infections. These bacteria can cause serious infections like pneumonia, meningitis, and blood infections. This vaccine will lower your chance of getting pneumonia. If you do get pneumonia, it can make your symptoms milder and your illness shorter. This vaccine will not treat an infection and will not cause infection. This vaccine is recommended for infants and young children, adults with certain medical conditions, and adults 75 years or older. This medicine may be used for other purposes; ask your health care provider or pharmacist if you have questions. COMMON BRAND NAME(S): Prevnar, Prevnar 13 What should I tell my health care provider  before I take this medicine? They need to know if you have any of these conditions: -bleeding problems -fever -immune system problems -an unusual or allergic reaction to pneumococcal vaccine, diphtheria toxoid, other vaccines, latex, other medicines, foods, dyes, or preservatives -pregnant or trying to get pregnant -breast-feeding How should I use this medicine? This vaccine is for injection into a muscle. It is given by a health care professional. A copy of Vaccine Information Statements will be given before each vaccination. Read this sheet carefully each time. The sheet may change frequently. Talk to your pediatrician regarding the use of this medicine in children. While this drug may be prescribed for children as young as 76 weeks old for selected conditions, precautions do apply. Overdosage: If you think you have taken too much of this medicine contact a poison control center or emergency room at once. NOTE: This medicine is only for you. Do not share this medicine with others. What if I miss a dose? It is important not to miss your dose. Call your doctor or health care professional if you are unable to keep an appointment. What may interact with this medicine? -medicines for cancer chemotherapy -medicines that suppress your immune function -steroid medicines like prednisone or cortisone This list may not describe all possible interactions. Give your health care provider a list of all the medicines, herbs, non-prescription drugs, or dietary supplements you use. Also tell them if you smoke, drink alcohol, or use illegal drugs. Some items may interact with your medicine. What should I  watch for while using this medicine? Mild fever and pain should go away in 3 days or less. Report any unusual symptoms to your doctor or health care professional. What side effects may I notice from receiving this medicine? Side effects that you should report to your doctor or health care professional as soon  as possible: -allergic reactions like skin rash, itching or hives, swelling of the face, lips, or tongue -breathing problems -confused -fast or irregular heartbeat -fever over 102 degrees F -seizures -unusual bleeding or bruising -unusual muscle weakness Side effects that usually do not require medical attention (report to your doctor or health care professional if they continue or are bothersome): -aches and pains -diarrhea -fever of 102 degrees F or less -headache -irritable -loss of appetite -pain, tender at site where injected -trouble sleeping This list may not describe all possible side effects. Call your doctor for medical advice about side effects. You may report side effects to FDA at 1-800-FDA-1088. Where should I keep my medicine? This does not apply. This vaccine is given in a clinic, pharmacy, doctor's office, or other health care setting and will not be stored at home. NOTE: This sheet is a summary. It may not cover all possible information. If you have questions about this medicine, talk to your doctor, pharmacist, or health care provider.  2018 Elsevier/Gold Standard (2013-11-05 10:27:27) Influenza Virus Vaccine injection What is this medicine? INFLUENZA VIRUS VACCINE (in floo EN zuh VAHY ruhs vak SEEN) helps to reduce the risk of getting influenza also known as the flu. The vaccine only helps protect you against some strains of the flu. This medicine may be used for other purposes; ask your health care provider or pharmacist if you have questions. COMMON BRAND NAME(S): Afluria, Agriflu, Alfuria, FLUAD, Fluarix, Fluarix Quadrivalent, Flublok, Flublok Quadrivalent, FLUCELVAX, Flulaval, Fluvirin, Fluzone, Fluzone High-Dose, Fluzone Intradermal What should I tell my health care provider before I take this medicine? They need to know if you have any of these conditions: -bleeding disorder like hemophilia -fever or infection -Guillain-Barre syndrome or other neurological  problems -immune system problems -infection with the human immunodeficiency virus (HIV) or AIDS -low blood platelet counts -multiple sclerosis -an unusual or allergic reaction to influenza virus vaccine, latex, other medicines, foods, dyes, or preservatives. Different brands of vaccines contain different allergens. Some may contain latex or eggs. Talk to your doctor about your allergies to make sure that you get the right vaccine. -pregnant or trying to get pregnant -breast-feeding How should I use this medicine? This vaccine is for injection into a muscle or under the skin. It is given by a health care professional. A copy of Vaccine Information Statements will be given before each vaccination. Read this sheet carefully each time. The sheet may change frequently. Talk to your healthcare provider to see which vaccines are right for you. Some vaccines should not be used in all age groups. Overdosage: If you think you have taken too much of this medicine contact a poison control center or emergency room at once. NOTE: This medicine is only for you. Do not share this medicine with others. What if I miss a dose? This does not apply. What may interact with this medicine? -chemotherapy or radiation therapy -medicines that lower your immune system like etanercept, anakinra, infliximab, and adalimumab -medicines that treat or prevent blood clots like warfarin -phenytoin -steroid medicines like prednisone or cortisone -theophylline -vaccines This list may not describe all possible interactions. Give your health care provider a list of all  the medicines, herbs, non-prescription drugs, or dietary supplements you use. Also tell them if you smoke, drink alcohol, or use illegal drugs. Some items may interact with your medicine. What should I watch for while using this medicine? Report any side effects that do not go away within 3 days to your doctor or health care professional. Call your health care  provider if any unusual symptoms occur within 6 weeks of receiving this vaccine. You may still catch the flu, but the illness is not usually as bad. You cannot get the flu from the vaccine. The vaccine will not protect against colds or other illnesses that may cause fever. The vaccine is needed every year. What side effects may I notice from receiving this medicine? Side effects that you should report to your doctor or health care professional as soon as possible: -allergic reactions like skin rash, itching or hives, swelling of the face, lips, or tongue Side effects that usually do not require medical attention (report to your doctor or health care professional if they continue or are bothersome): -fever -headache -muscle aches and pains -pain, tenderness, redness, or swelling at the injection site -tiredness This list may not describe all possible side effects. Call your doctor for medical advice about side effects. You may report side effects to FDA at 1-800-FDA-1088. Where should I keep my medicine? The vaccine will be given by a health care professional in a clinic, pharmacy, doctor's office, or other health care setting. You will not be given vaccine doses to store at home. NOTE: This sheet is a summary. It may not cover all possible information. If you have questions about this medicine, talk to your doctor, pharmacist, or health care provider.  2018 Elsevier/Gold Standard (2014-08-20 10:07:28)

## 2016-12-12 NOTE — Progress Notes (Addendum)
Subjective:   Jacob [name] is a 66 y.o. male who presents for Medicare Annual/Subsequent preventive examination.  Review of Systems:  No ROS.  Medicare Wellness Visit. Additional risk factors are reflected in the social history.  Cardiac Risk Factors include: advanced age (>68men, >69 women);male gender Sleep patterns: feels rested on waking, gets up 1-2 times nightly to void and sleeps 6-8 hours nightly.    Home Safety/Smoke Alarms: Feels safe in home. Smoke alarms in place.  Living environment; residence and Firearm Safety: 2-story house, no firearms. Lives with wife, no needs for DME, good support system Seat Belt Safety/Bike Helmet: Wears seat belt.     Objective:    Vitals: BP 124/76   Pulse 66   Resp 20   Ht 5\' 11"  (1.803 m)   Wt 203 lb (92.1 kg)   SpO2 99%   BMI 28.31 kg/m   Body mass index is 28.31 kg/m.  Tobacco History  Smoking Status  . Passive Smoke Exposure - Never Smoker  . Types: Cigars  Smokeless Tobacco  . Never Used    Comment: occasional     Counseling given: Not Answered   Past Medical History:  Diagnosis Date  . Eczema    hot tub related  . GI bleed    s/p polypectomy  . Herpes zoster 2010  . Prostate cancer ([place])   . SVT (supraventricular tachycardia) (H[facility])    Past Surgical History:  Procedure Laterality Date  . APPENDECTOMY  05/2010  . CYSTOSCOPY N/A 08/30/2016   Procedure: CYSTOSCOPY FLEXIBLE;  Surgeon: Irine Seal, MD;  Location: [facility];  Service: Urology;  Laterality: N/A;  no seeds found in bladder  . ELECTROPHYSIOLOGY STUDY  08/24/13   EPS by Dr Lovena Le with no inducible SVT or evidence of dual AV nodal physiology  . PROSTATE BIOPSY    . RADIOACTIVE SEED IMPLANT N/A 08/30/2016   Procedure: RADIOACTIVE SEED IMPLANT/BRACHYTHERAPY IMPLANT WITH SPACE OAR;  Surgeon: Irine Seal, MD;  Location: [facility];  Service: Urology;  Laterality: N/A;    62   seeds implanted  . ROTATOR CUFF REPAIR  2003     right  . SUPRAVENTRICULAR TACHYCARDIA ABLATION N/A 08/24/2013   Procedure: SUPRAVENTRICULAR TACHYCARDIA ABLATION;  Surgeon: Evans Lance, MD;  Location: [facility] CATH LAB;  Service: Cardiovascular;  Laterality: N/A;   Family History  Problem Relation Age of Onset  . Heart disease Mother        pacemaker  . Cancer Mother        lung/smoker/passed at age 91  . Heart attack Father   . Cancer Paternal Uncle        pancreatic   History  Sexual Activity  . Sexual activity: Yes    Outpatient Encounter Prescriptions as of 12/12/2016  Medication Sig  . amoxicillin (AMOXIL) 250 MG capsule Take 1,000 mg by mouth once.  Marland Kitchen aspirin EC 81 MG tablet Take 81 mg by mouth every evening.   . cholecalciferol (VITAMIN D) 1000 UNITS tablet Take 1,000 Units by mouth every evening.   Marland Kitchen glucosamine-chondroitin 500-400 MG tablet Take 2 tablets by mouth every evening.  . Multiple Vitamins-Minerals (MULTIVITAL PO) Take 1 tablet by mouth every evening.   . Omega-3 Fatty Acids (FISH OIL) 1000 MG CAPS Take 2,000 mg by mouth every evening.   Marland Kitchen OVER THE COUNTER MEDICATION Take 500 mg by mouth every evening. Lysin tab once daily  . [DISCONTINUED] HYDROcodone-acetaminophen (NORCO) 5-325 MG tablet Take 1 tablet by mouth  every 6 (six) hours as needed for moderate pain. (Patient not taking: Reported on 09/21/2016)  . [DISCONTINUED] sulfamethoxazole-trimethoprim (BACTRIM DS,SEPTRA DS) 800-160 MG tablet Take 1 tablet by mouth 2 (two) times daily. (Patient not taking: Reported on 09/21/2016)   No facility-administered encounter medications on file as of 12/12/2016.     Activities of Daily Living In your present state of health, do you have any difficulty performing the following activities: 12/12/2016 08/30/2016  Hearing? N N  Vision? N N  Difficulty concentrating or making decisions? N N  Walking or climbing stairs? N N  Dressing or bathing? N N  Doing errands, shopping? N -  Preparing Food and eating ? N -  Using the  Toilet? N -  In the past six months, have you accidently leaked urine? Y -  Comment prostate cancer followed by Dr. Jeffie Pollock -  Do you have problems with loss of bowel control? N -  Managing your Medications? N -  Managing your Finances? N -  Housekeeping or managing your Housekeeping? N -  Some recent data might be hidden    Patient Care Team: Plotnikov, Evie Lacks, MD as PCP - General Rosana Berger, MD as Referring Physician (Gastroenterology) Tyler Pita, MD as Consulting Physician (Radiation Oncology) Irine Seal, MD as Attending Physician (Urology)   Assessment:    Physical assessment deferred to PCP.  Exercise Activities and Dietary recommendations Current Exercise Habits: Home exercise routine, Type of exercise: walking (Bicycling), Time (Minutes): 50, Frequency (Times/Week): 4, Weekly Exercise (Minutes/Week): 200, Intensity: Moderate, Exercise limited by: None identified  Diet (meal preparation, eat out, water intake, caffeinated beverages, dairy products, fruits and vegetables): in general, a "healthy" diet  , well balanced, eats a variety of fruits and vegetables daily, limits salt, fat/cholesterol, sugar, caffeine, drinks 6-8 glasses of water daily.  Goals    . lose weight          Continue to be physically active and eat healthy. Maintain current healthy status      Fall Risk Fall Risk  12/12/2016 09/21/2016 06/04/2016 11/01/2015  Falls in the past year? No No No No   Depression Screen PHQ 2/9 Scores 12/12/2016 09/21/2016 06/04/2016 11/01/2015  PHQ - 2 Score 0 0 0 0  PHQ- 9 Score 0 - - -    Cognitive Function       Ad8 score reviewed for issues:  Issues making decisions: no  Less interest in hobbies / activities: no  Repeats questions, stories (family complaining): no  Trouble using ordinary gadgets (microwave, computer, phone):no  Forgets the month or year: no  Mismanaging finances: no  Remembering appts: no  Daily problems with thinking and/or  memory: no Ad8 score is= 0  Immunization History  Administered Date(s) Administered  . Influenza Split 10/30/2011  . Influenza Whole 12/01/2003, 11/11/2007, 11/23/2008, 11/13/2010  . Influenza,inj,Quad PF,6+ Mos 11/01/2015  . Influenza-Unspecified 11/12/2013  . Pneumococcal Conjugate-13 11/01/2015  . Tdap 03/14/2011  . Zoster 04/28/2013   Screening Tests Health Maintenance  Topic Date Due  . Hepatitis C Screening  April 14, 1950  . COLONOSCOPY  02/29/2000  . INFLUENZA VACCINE  09/12/2016  . PNA vac Low Risk Adult (2 of 2 - PPSV23) 10/31/2016  . TETANUS/TDAP  03/13/2021      Plan:   Continue doing brain stimulating activities (puzzles, reading, adult coloring books, staying active) to keep memory sharp.   Continue to eat heart healthy diet (full of fruits, vegetables, whole grains, lean protein, water--limit salt, fat, and sugar intake)  and increase physical activity as tolerated.  I have personally reviewed and noted the following in the patient's chart:   . Medical and social history . Use of alcohol, tobacco or illicit drugs  . Current medications and supplements . Functional ability and status . Nutritional status . Physical activity . Advanced directives . List of other physicians . Vitals . Screenings to include cognitive, depression, and falls . Referrals and appointments  In addition, I have reviewed and discussed with patient certain preventive protocols, quality metrics, and best practice recommendations. A written personalized care plan for preventive services as well as general preventive health recommendations were provided to patient.     Michiel Cowboy, RN  12/12/2016  Medical screening examination/treatment/procedure(s) were performed by non-physician practitioner and as supervising physician I was immediately available for consultation/collaboration. I agree with above. Lew Dawes, MD

## 2016-12-12 NOTE — Progress Notes (Signed)
Pre visit review using our clinic review tool, if applicable. No additional management support is needed unless otherwise documented below in the visit note. 

## 2016-12-14 ENCOUNTER — Telehealth: Payer: Self-pay | Admitting: Internal Medicine

## 2016-12-14 NOTE — Telephone Encounter (Signed)
Patient Name: Jacob [name]  DOB: 11/25/1950    Initial Comment Caller states had a pneumonia shot, has swelling in bicep and in elbow area, red and sore   Nurse Assessment  Nurse: Melina Modena, RN, [place] Date/Time (Eastern Time): 12/14/2016 1:03:00 PM  Confirm and document reason for call. If symptomatic, describe symptoms. ---Caller states had a pneumonia shot, has swelling in right bicep and in elbow area, red and sore. Received shot on Wednesday. No fever.  Does the patient have any new or worsening symptoms? ---Yes  Will a triage be completed? ---Yes  Related visit to physician within the last 2 weeks? ---No  Does the PT have any chronic conditions? (i.e. diabetes, asthma, etc.) ---No  Is this a behavioral health or substance abuse call? ---No     Guidelines    Guideline Title Affirmed Question Affirmed Notes  Immunization Reactions Pneumococcal vaccine reactions    Final Disposition User   [facility], RN, [place]    Comments  swelling inside of elbow where bone is  has only been two days since injection

## 2016-12-25 ENCOUNTER — Ambulatory Visit (INDEPENDENT_AMBULATORY_CARE_PROVIDER_SITE_OTHER): Payer: Medicare Other | Admitting: Internal Medicine

## 2016-12-25 ENCOUNTER — Encounter: Payer: Self-pay | Admitting: Internal Medicine

## 2016-12-25 VITALS — BP 128/86 | HR 60 | Temp 98.2°F | Resp 16 | Ht 71.0 in | Wt 201.1 lb

## 2016-12-25 DIAGNOSIS — E785 Hyperlipidemia, unspecified: Secondary | ICD-10-CM

## 2016-12-25 DIAGNOSIS — R7989 Other specified abnormal findings of blood chemistry: Secondary | ICD-10-CM | POA: Diagnosis not present

## 2016-12-25 DIAGNOSIS — C61 Malignant neoplasm of prostate: Secondary | ICD-10-CM

## 2016-12-25 NOTE — Assessment & Plan Note (Addendum)
TSH, FT4 

## 2016-12-25 NOTE — Assessment & Plan Note (Signed)
Dr Tammi Klippel  (s/p XRT seeds in 7/18 - Dr Jeffie Pollock)

## 2016-12-25 NOTE — Assessment & Plan Note (Signed)
CBC

## 2016-12-25 NOTE — Progress Notes (Signed)
Subjective:  Patient ID: Jacob [name], male    DOB: Aug 13, 1950  Age: 66 y.o. MRN: [id number]  CC: No chief complaint on file.   HPI Brance Dartt presents for prostate ca (s/p XRT seeds in 7/18 - Dr Jeffie Pollock)  Outpatient Medications Prior to Visit  Medication Sig Dispense Refill  . amoxicillin (AMOXIL) 250 MG capsule Take 1,000 mg by mouth once.    Marland Kitchen aspirin EC 81 MG tablet Take 81 mg by mouth every evening.     . cholecalciferol (VITAMIN D) 1000 UNITS tablet Take 1,000 Units by mouth every evening.     Marland Kitchen glucosamine-chondroitin 500-400 MG tablet Take 2 tablets by mouth every evening.    . Multiple Vitamins-Minerals (MULTIVITAL PO) Take 1 tablet by mouth every evening.     . Omega-3 Fatty Acids (FISH OIL) 1000 MG CAPS Take 2,000 mg by mouth every evening.     Marland Kitchen OVER THE COUNTER MEDICATION Take 500 mg by mouth every evening. Lysin tab once daily     No facility-administered medications prior to visit.     ROS Review of Systems  Constitutional: Negative for appetite change, fatigue and unexpected weight change.  HENT: Negative for congestion, nosebleeds, sneezing, sore throat and trouble swallowing.   Eyes: Negative for itching and visual disturbance.  Respiratory: Negative for cough.   Cardiovascular: Negative for chest pain, palpitations and leg swelling.  Gastrointestinal: Negative for abdominal distention, blood in stool, diarrhea and nausea.  Genitourinary: Negative for frequency and hematuria.  Musculoskeletal: Negative for back pain, gait problem, joint swelling and neck pain.  Skin: Negative for rash.  Neurological: Negative for dizziness, tremors, speech difficulty and weakness.  Psychiatric/Behavioral: Negative for agitation, dysphoric mood and sleep disturbance. The patient is not nervous/anxious.     Objective:  BP 128/86 (BP Location: Right Arm, Patient Position: Sitting, Cuff Size: Large)   Pulse 60   Temp 98.2 F (36.8 C) (Oral)   Resp 16   Ht 5\' 11"   (1.803 m)   Wt 201 lb 1.9 oz (91.2 kg)   SpO2 97%   BMI 28.05 kg/m   BP Readings from Last 3 Encounters:  12/25/16 128/86  12/12/16 124/76  09/21/16 (!) 127/95    Wt Readings from Last 3 Encounters:  12/25/16 201 lb 1.9 oz (91.2 kg)  12/12/16 203 lb (92.1 kg)  09/21/16 200 lb 12.8 oz (91.1 kg)    Physical Exam  Constitutional: He is oriented to person, place, and time. He appears well-developed. No distress.  NAD  HENT:  Mouth/Throat: Oropharynx is clear and moist.  Eyes: Conjunctivae are normal. Pupils are equal, round, and reactive to light.  Neck: Normal range of motion. No JVD present. No thyromegaly present.  Cardiovascular: Normal rate, regular rhythm, normal heart sounds and intact distal pulses. Exam reveals no gallop and no friction rub.  No murmur heard. Pulmonary/Chest: Effort normal and breath sounds normal. No respiratory distress. He has no wheezes. He has no rales. He exhibits no tenderness.  Abdominal: Soft. Bowel sounds are normal. He exhibits no distension and no mass. There is no tenderness. There is no rebound and no guarding.  Musculoskeletal: Normal range of motion. He exhibits no edema or tenderness.  Lymphadenopathy:    He has no cervical adenopathy.  Neurological: He is alert and oriented to person, place, and time. He has normal reflexes. No cranial nerve deficit. He exhibits normal muscle tone. He displays a negative Romberg sign. Coordination and gait normal.  Skin: Skin is warm and  dry. No rash noted.  Psychiatric: He has a normal mood and affect. His behavior is normal. Judgment and thought content normal.    Lab Results  Component Value Date   WBC 4.7 08/24/2016   HGB 15.0 08/24/2016   HCT 43.4 08/24/2016   PLT 174 08/24/2016   GLUCOSE 99 08/24/2016   CHOL 171 10/31/2015   TRIG 113.0 10/31/2015   HDL 46.80 10/31/2015   LDLDIRECT 126.2 10/24/2011   LDLCALC 101 (H) 10/31/2015   ALT 15 (L) 08/24/2016   AST 24 08/24/2016   NA 140  08/24/2016   K 4.5 08/24/2016   CL 105 08/24/2016   CREATININE 1.07 08/24/2016   BUN 12 08/24/2016   CO2 27 08/24/2016   TSH 4.79 (H) 10/31/2015   PSA 4.45 (H) 10/31/2015   INR 0.95 08/24/2016   HGBA1C 5.6 04/29/2013    No results found.  Assessment & Plan:   There are no diagnoses linked to this encounter. I am having Riyansh Braatz "ALEX" maintain his Fish Oil, Multiple Vitamins-Minerals (MULTIVITAL PO), OVER THE COUNTER MEDICATION, aspirin EC, cholecalciferol, glucosamine-chondroitin, and amoxicillin.  No orders of the defined types were placed in this encounter.    Follow-up: No Follow-up on file.  Walker Kehr, MD

## 2016-12-28 ENCOUNTER — Other Ambulatory Visit (INDEPENDENT_AMBULATORY_CARE_PROVIDER_SITE_OTHER): Payer: Medicare Other

## 2016-12-28 DIAGNOSIS — E785 Hyperlipidemia, unspecified: Secondary | ICD-10-CM | POA: Diagnosis not present

## 2016-12-28 DIAGNOSIS — C61 Malignant neoplasm of prostate: Secondary | ICD-10-CM

## 2016-12-28 DIAGNOSIS — R7989 Other specified abnormal findings of blood chemistry: Secondary | ICD-10-CM

## 2016-12-28 LAB — BASIC METABOLIC PANEL
BUN: 14 mg/dL (ref 6–23)
CO2: 26 mEq/L (ref 19–32)
Calcium: 9.9 mg/dL (ref 8.4–10.5)
Chloride: 103 mEq/L (ref 96–112)
Creatinine, Ser: 1.02 mg/dL (ref 0.40–1.50)
GFR: 77.47 mL/min (ref >60.00)
Glucose, Bld: 112 mg/dL — ABNORMAL HIGH (ref 70–99)
Potassium: 4.1 mEq/L (ref 3.5–5.1)
Sodium: 139 mEq/L (ref 135–145)

## 2016-12-28 LAB — HEPATIC FUNCTION PANEL
ALT: 11 U/L (ref 0–53)
AST: 17 U/L (ref 0–37)
Albumin: 4.6 g/dL (ref 3.5–5.2)
Alkaline Phosphatase: 49 U/L (ref 39–117)
Bilirubin, Direct: 0.1 mg/dL (ref 0.0–0.3)
Total Bilirubin: 0.7 mg/dL (ref 0.2–1.2)
Total Protein: 7.3 g/dL (ref 6.0–8.3)

## 2016-12-28 LAB — CBC WITH DIFFERENTIAL/PLATELET
Basophils Absolute: 0 10*3/uL (ref 0.0–0.1)
Basophils Relative: 0.5 % (ref 0.0–3.0)
Eosinophils Absolute: 0.1 10*3/uL (ref 0.0–0.7)
Eosinophils Relative: 1 % (ref 0.0–5.0)
HCT: 46.9 % (ref 39.0–52.0)
Hemoglobin: 15.7 g/dL (ref 13.0–17.0)
Lymphocytes Relative: 21.1 % (ref 12.0–46.0)
Lymphs Abs: 1.8 10*3/uL (ref 0.7–4.0)
MCHC: 33.4 g/dL (ref 30.0–36.0)
MCV: 93.7 fl (ref 78.0–100.0)
Monocytes Absolute: 0.7 10*3/uL (ref 0.1–1.0)
Monocytes Relative: 8.5 % (ref 3.0–12.0)
Neutro Abs: 5.9 10*3/uL (ref 1.4–7.7)
Neutrophils Relative %: 68.9 % (ref 43.0–77.0)
Platelets: 213 10*3/uL (ref 150.0–400.0)
RBC: 5 Mil/uL (ref 4.22–5.81)
RDW: 13.3 % (ref 11.5–15.5)
WBC: 8.6 10*3/uL (ref 4.0–10.5)

## 2016-12-28 LAB — LIPID PANEL
Cholesterol: 184 mg/dL (ref 0–200)
HDL: 50.4 mg/dL (ref >39.00)
LDL Cholesterol: 103 mg/dL — ABNORMAL HIGH (ref 0–99)
NonHDL: 133.86
Total CHOL/HDL Ratio: 4
Triglycerides: 156 mg/dL — ABNORMAL HIGH (ref 0.0–149.0)
VLDL: 31.2 mg/dL (ref 0.0–40.0)

## 2016-12-28 LAB — TSH: TSH: 4.62 u[IU]/mL — ABNORMAL HIGH (ref 0.35–4.50)

## 2017-01-01 DIAGNOSIS — C61 Malignant neoplasm of prostate: Secondary | ICD-10-CM | POA: Diagnosis not present

## 2017-01-02 ENCOUNTER — Other Ambulatory Visit: Payer: Self-pay

## 2017-01-02 DIAGNOSIS — E039 Hypothyroidism, unspecified: Secondary | ICD-10-CM

## 2017-01-02 DIAGNOSIS — R7989 Other specified abnormal findings of blood chemistry: Secondary | ICD-10-CM

## 2017-01-09 DIAGNOSIS — C61 Malignant neoplasm of prostate: Secondary | ICD-10-CM | POA: Diagnosis not present

## 2017-01-09 DIAGNOSIS — R3912 Poor urinary stream: Secondary | ICD-10-CM | POA: Diagnosis not present

## 2017-01-09 DIAGNOSIS — N401 Enlarged prostate with lower urinary tract symptoms: Secondary | ICD-10-CM | POA: Diagnosis not present

## 2017-01-10 ENCOUNTER — Telehealth: Payer: Self-pay | Admitting: Internal Medicine

## 2017-01-10 NOTE — Telephone Encounter (Signed)
Spoke with pt to let him know that no appointment is needed for these labs to be drawn. There are already orders in the system ready for him. He will come back to have them done around 01/28/2017.

## 2017-01-10 NOTE — Telephone Encounter (Signed)
Lab results given

## 2017-01-31 ENCOUNTER — Other Ambulatory Visit (INDEPENDENT_AMBULATORY_CARE_PROVIDER_SITE_OTHER): Payer: Medicare Other

## 2017-01-31 DIAGNOSIS — E039 Hypothyroidism, unspecified: Secondary | ICD-10-CM

## 2017-01-31 LAB — T4, FREE: Free T4: 0.57 ng/dL — ABNORMAL LOW (ref 0.60–1.60)

## 2017-01-31 LAB — TSH: TSH: 6.18 u[IU]/mL — ABNORMAL HIGH (ref 0.35–4.50)

## 2017-02-01 ENCOUNTER — Other Ambulatory Visit: Payer: Self-pay | Admitting: Internal Medicine

## 2017-02-01 MED ORDER — LEVOTHYROXINE SODIUM 25 MCG PO TABS: 25.0000 ug | ORAL_TABLET | Freq: Every day | ORAL | 11 refills | Status: DC

## 2017-04-11 ENCOUNTER — Other Ambulatory Visit (INDEPENDENT_AMBULATORY_CARE_PROVIDER_SITE_OTHER): Payer: Medicare Other

## 2017-04-11 ENCOUNTER — Other Ambulatory Visit: Payer: Medicare Other

## 2017-04-11 ENCOUNTER — Telehealth: Payer: Self-pay | Admitting: Internal Medicine

## 2017-04-11 ENCOUNTER — Ambulatory Visit (INDEPENDENT_AMBULATORY_CARE_PROVIDER_SITE_OTHER): Payer: Medicare Other | Admitting: Internal Medicine

## 2017-04-11 ENCOUNTER — Encounter: Payer: Self-pay | Admitting: Internal Medicine

## 2017-04-11 DIAGNOSIS — E039 Hypothyroidism, unspecified: Secondary | ICD-10-CM | POA: Diagnosis not present

## 2017-04-11 DIAGNOSIS — K5792 Diverticulitis of intestine, part unspecified, without perforation or abscess without bleeding: Secondary | ICD-10-CM

## 2017-04-11 LAB — URINALYSIS
Bilirubin Urine: NEGATIVE
Hgb urine dipstick: NEGATIVE
Ketones, ur: NEGATIVE
Leukocytes, UA: NEGATIVE
Nitrite: NEGATIVE
Specific Gravity, Urine: 1.01 (ref 1.000–1.030)
Total Protein, Urine: NEGATIVE
Urine Glucose: NEGATIVE
Urobilinogen, UA: 0.2 (ref 0.0–1.0)
pH: 6.5 (ref 5.0–8.0)

## 2017-04-11 LAB — BASIC METABOLIC PANEL
BUN: 11 mg/dL (ref 6–23)
CO2: 29 mEq/L (ref 19–32)
Calcium: 9.9 mg/dL (ref 8.4–10.5)
Chloride: 102 mEq/L (ref 96–112)
Creatinine, Ser: 0.97 mg/dL (ref 0.40–1.50)
GFR: 82.02 mL/min (ref >60.00)
Glucose, Bld: 90 mg/dL (ref 70–99)
Potassium: 4.1 mEq/L (ref 3.5–5.1)
Sodium: 137 mEq/L (ref 135–145)

## 2017-04-11 LAB — CBC WITH DIFFERENTIAL/PLATELET
Basophils Absolute: 0 10*3/uL (ref 0.0–0.1)
Basophils Relative: 0.5 % (ref 0.0–3.0)
Eosinophils Absolute: 0.1 10*3/uL (ref 0.0–0.7)
Eosinophils Relative: 1.2 % (ref 0.0–5.0)
HCT: 46 % (ref 39.0–52.0)
Hemoglobin: 15.7 g/dL (ref 13.0–17.0)
Lymphocytes Relative: 31.4 % (ref 12.0–46.0)
Lymphs Abs: 2.8 10*3/uL (ref 0.7–4.0)
MCHC: 34.2 g/dL (ref 30.0–36.0)
MCV: 90.4 fl (ref 78.0–100.0)
Monocytes Absolute: 0.9 10*3/uL (ref 0.1–1.0)
Monocytes Relative: 9.5 % (ref 3.0–12.0)
Neutro Abs: 5.2 10*3/uL (ref 1.4–7.7)
Neutrophils Relative %: 57.4 % (ref 43.0–77.0)
Platelets: 200 10*3/uL (ref 150.0–400.0)
RBC: 5.08 Mil/uL (ref 4.22–5.81)
RDW: 13.1 % (ref 11.5–15.5)
WBC: 9 10*3/uL (ref 4.0–10.5)

## 2017-04-11 LAB — SEDIMENTATION RATE: Sed Rate: 13 mm/hr (ref 0–20)

## 2017-04-11 LAB — TSH: TSH: 4.64 u[IU]/mL — ABNORMAL HIGH (ref 0.35–4.50)

## 2017-04-11 MED ORDER — METRONIDAZOLE 500 MG PO TABS: 500.0000 mg | ORAL_TABLET | Freq: Three times a day (TID) | ORAL | 0 refills | Status: DC

## 2017-04-11 MED ORDER — LEVOTHYROXINE SODIUM 50 MCG PO TABS
50.0000 ug | ORAL_TABLET | Freq: Every day | ORAL | 3 refills | Status: DC
Start: 2017-04-11 — End: 2018-05-02

## 2017-04-11 MED ORDER — AMOXICILLIN-POT CLAVULANATE 875-125 MG PO TABS: 1.0000 | ORAL_TABLET | Freq: Two times a day (BID) | ORAL | 0 refills | Status: DC

## 2017-04-11 NOTE — Assessment & Plan Note (Signed)
Augmentin and Flagyl

## 2017-04-11 NOTE — Telephone Encounter (Signed)
Pt scheduled at 11:30 today.

## 2017-04-11 NOTE — Patient Instructions (Signed)
Diverticulitis °Diverticulitis is infection or inflammation of small pouches (diverticula) in the colon that form due to a condition called diverticulosis. Diverticula can trap stool (feces) and bacteria, causing infection and inflammation. °Diverticulitis may cause severe stomach pain and diarrhea. It may lead to tissue damage in the colon that causes bleeding. The diverticula may also burst (rupture) and cause infected stool to enter other areas of the abdomen. °Complications of diverticulitis can include: °· Bleeding. °· Severe infection. °· Severe pain. °· Rupture (perforation) of the colon. °· Blockage (obstruction) of the colon. ° °What are the causes? °This condition is caused by stool becoming trapped in the diverticula, which allows bacteria to grow in the diverticula. This leads to inflammation and infection. °What increases the risk? °You are more likely to develop this condition if: °· You have diverticulosis. The risk for diverticulosis increases if: °? You are overweight or obese. °? You use tobacco products. °? You do not get enough exercise. °· You eat a diet that does not include enough fiber. High-fiber foods include fruits, vegetables, beans, nuts, and whole grains. ° °What are the signs or symptoms? °Symptoms of this condition may include: °· Pain and tenderness in the abdomen. The pain is normally located on the left side of the abdomen, but it may occur in other areas. °· Fever and chills. °· Bloating. °· Cramping. °· Nausea. °· Vomiting. °· Changes in bowel routines. °· Blood in your stool. ° °How is this diagnosed? °This condition is diagnosed based on: °· Your medical history. °· A physical exam. °· Tests to make sure there is nothing else causing your condition. These tests may include: °? Blood tests. °? Urine tests. °? Imaging tests of the abdomen, including X-rays, ultrasounds, MRIs, or CT scans. ° °How is this treated? °Most cases of this condition are mild and can be treated at home.  Treatment may include: °· Taking over-the-counter pain medicines. °· Following a clear liquid diet. °· Taking antibiotic medicines by mouth. °· Rest. ° °More severe cases may need to be treated at a hospital. Treatment may include: °· Not eating or drinking. °· Taking prescription pain medicine. °· Receiving antibiotic medicines through an IV tube. °· Receiving fluids and nutrition through an IV tube. °· Surgery. ° °When your condition is under control, your health care provider may recommend that you have a colonoscopy. This is an exam to look at the entire large intestine. During the exam, a lubricated, bendable tube is inserted into the anus and then passed into the rectum, colon, and other parts of the large intestine. A colonoscopy can show how severe your diverticula are and whether something else may be causing your symptoms. °Follow these instructions at home: °Medicines °· Take over-the-counter and prescription medicines only as told by your health care provider. These include fiber supplements, probiotics, and stool softeners. °· If you were prescribed an antibiotic medicine, take it as told by your health care provider. Do not stop taking the antibiotic even if you start to feel better. °· Do not drive or use heavy machinery while taking prescription pain medicine. °General instructions °· Follow a full liquid diet or another diet as directed by your health care provider. After your symptoms improve, your health care provider may tell you to change your diet. He or she may recommend that you eat a diet that contains at least 25 g (25 grams) of fiber daily. Fiber makes it easier to pass stool. Healthy sources of fiber include: °? Berries. One cup   contains 4-8 grams of fiber. °? Beans or lentils. One half cup contains 5-8 grams of fiber. °? Green vegetables. One cup contains 4 grams of fiber. °· Exercise for at least 30 minutes, 3 times each week. You should exercise hard enough to raise your heart rate and  break a sweat. °· Keep all follow-up visits as told by your health care provider. This is important. You may need a colonoscopy. °Contact a health care provider if: °· Your pain does not improve. °· You have a hard time drinking or eating food. °· Your bowel movements do not return to normal. °Get help right away if: °· Your pain gets worse. °· Your symptoms do not get better with treatment. °· Your symptoms suddenly get worse. °· You have a fever. °· You vomit more than one time. °· You have stools that are bloody, black, or tarry. °Summary °· Diverticulitis is infection or inflammation of small pouches (diverticula) in the colon that form due to a condition called diverticulosis. Diverticula can trap stool (feces) and bacteria, causing infection and inflammation. °· You are at higher risk for this condition if you have diverticulosis and you eat a diet that does not include enough fiber. °· Most cases of this condition are mild and can be treated at home. More severe cases may need to be treated at a hospital. °· When your condition is under control, your health care provider may recommend that you have an exam called a colonoscopy. This exam can show how severe your diverticula are and whether something else may be causing your symptoms. °This information is not intended to replace advice given to you by your health care provider. Make sure you discuss any questions you have with your health care provider. °Document Released: 11/08/2004 Document Revised: 03/03/2016 Document Reviewed: 03/03/2016 °Elsevier Interactive Patient Education © 2018 [employer] ° °

## 2017-04-11 NOTE — Progress Notes (Signed)
   Subjective:  Patient ID: Jacob [name], male    DOB: 08-21-1950  Age: 67 y.o. MRN: [id number]  CC: No chief complaint on file.   HPI Benecio Kluger presents for LLQ pain since this wknd - it was severe this am, did not sleep well. It is better now. He had sweats. No fever. No n/v. H/o diverticulosis     Outpatient Medications Prior to Visit  Medication Sig Dispense Refill  . amoxicillin (AMOXIL) 250 MG capsule Take 1,000 mg by mouth once.    Marland Kitchen aspirin EC 81 MG tablet Take 81 mg by mouth every evening.     . cholecalciferol (VITAMIN D) 1000 UNITS tablet Take 1,000 Units by mouth every evening.     Marland Kitchen glucosamine-chondroitin 500-400 MG tablet Take 2 tablets by mouth every evening.    Marland Kitchen levothyroxine (SYNTHROID, LEVOTHROID) 25 MCG tablet Take 1 tablet (25 mcg total) by mouth daily before breakfast. 30 tablet 11  . Multiple Vitamins-Minerals (MULTIVITAL PO) Take 1 tablet by mouth every evening.     . Omega-3 Fatty Acids (FISH OIL) 1000 MG CAPS Take 2,000 mg by mouth every evening.     Marland Kitchen OVER THE COUNTER MEDICATION Take 500 mg by mouth every evening. Lysin tab once daily     No facility-administered medications prior to visit.     ROS Review of Systems  Objective:  BP 122/76 (BP Location: Left Arm, Patient Position: Sitting, Cuff Size: Large)   Pulse 68   Temp 98.2 F (36.8 C) (Oral)   Ht 5\' 11"  (1.803 m)   Wt 203 lb (92.1 kg)   SpO2 98%   BMI 28.31 kg/m   BP Readings from Last 3 Encounters:  04/11/17 122/76  12/25/16 128/86  12/12/16 124/76    Wt Readings from Last 3 Encounters:  04/11/17 203 lb (92.1 kg)  12/25/16 201 lb 1.9 oz (91.2 kg)  12/12/16 203 lb (92.1 kg)    Physical Exam LLQ tender; no rebound   Lab Results  Component Value Date   WBC 8.6 12/28/2016   HGB 15.7 12/28/2016   HCT 46.9 12/28/2016   PLT 213.0 12/28/2016   GLUCOSE 112 (H) 12/28/2016   CHOL 184 12/28/2016   TRIG 156.0 (H) 12/28/2016   HDL 50.40 12/28/2016   LDLDIRECT 126.2  10/24/2011   LDLCALC 103 (H) 12/28/2016   ALT 11 12/28/2016   AST 17 12/28/2016   NA 139 12/28/2016   K 4.1 12/28/2016   CL 103 12/28/2016   CREATININE 1.02 12/28/2016   BUN 14 12/28/2016   CO2 26 12/28/2016   TSH 6.18 (H) 01/31/2017   PSA 4.45 (H) 10/31/2015   INR 0.95 08/24/2016   HGBA1C 5.6 04/29/2013    No results found.  Assessment & Plan:   There are no diagnoses linked to this encounter. I am having Taje Reine "ALEX" maintain his Fish Oil, Multiple Vitamins-Minerals (MULTIVITAL PO), OVER THE COUNTER MEDICATION, aspirin EC, cholecalciferol, glucosamine-chondroitin, amoxicillin, and levothyroxine.  No orders of the defined types were placed in this encounter.    Follow-up: No Follow-up on file.  Walker Kehr, MD

## 2017-04-11 NOTE — Telephone Encounter (Signed)
Copied from [place]. Topic: Quick Communication - See Telephone Encounter >> Apr 11, 2017  8:05 AM Conception Chancy, NT wrote: CRM for notification. See Telephone encounter for:  04/11/17.  Patient is calling today states he has had abdominal discomfort and testicle discomfort since the beginning of this week. Dr. Alain Marion does not have anything available today or tomorrow. I offered the patient another provider to get him seen today 04/11/17 he refused at this time. I also offered NT patient also refused. Patient would like to see if Dr. Alain Marion nurse can get him fit in with him. Please advise. Patient can be reached at [phone] or [phone]

## 2017-04-19 DIAGNOSIS — H5203 Hypermetropia, bilateral: Secondary | ICD-10-CM | POA: Diagnosis not present

## 2017-04-19 DIAGNOSIS — H2513 Age-related nuclear cataract, bilateral: Secondary | ICD-10-CM | POA: Diagnosis not present

## 2017-04-19 DIAGNOSIS — H52203 Unspecified astigmatism, bilateral: Secondary | ICD-10-CM | POA: Diagnosis not present

## 2017-04-19 DIAGNOSIS — H524 Presbyopia: Secondary | ICD-10-CM | POA: Diagnosis not present

## 2017-05-10 ENCOUNTER — Ambulatory Visit: Payer: Medicare Other | Admitting: Internal Medicine

## 2017-05-13 DIAGNOSIS — D1801 Hemangioma of skin and subcutaneous tissue: Secondary | ICD-10-CM | POA: Diagnosis not present

## 2017-05-13 DIAGNOSIS — L570 Actinic keratosis: Secondary | ICD-10-CM | POA: Diagnosis not present

## 2017-05-13 DIAGNOSIS — D485 Neoplasm of uncertain behavior of skin: Secondary | ICD-10-CM | POA: Diagnosis not present

## 2017-05-13 DIAGNOSIS — D2371 Other benign neoplasm of skin of right lower limb, including hip: Secondary | ICD-10-CM | POA: Diagnosis not present

## 2017-05-13 DIAGNOSIS — L821 Other seborrheic keratosis: Secondary | ICD-10-CM | POA: Diagnosis not present

## 2017-05-13 DIAGNOSIS — C44319 Basal cell carcinoma of skin of other parts of face: Secondary | ICD-10-CM | POA: Diagnosis not present

## 2017-05-13 DIAGNOSIS — D225 Melanocytic nevi of trunk: Secondary | ICD-10-CM | POA: Diagnosis not present

## 2017-05-15 ENCOUNTER — Other Ambulatory Visit (INDEPENDENT_AMBULATORY_CARE_PROVIDER_SITE_OTHER): Payer: Medicare Other

## 2017-05-15 ENCOUNTER — Encounter: Payer: Self-pay | Admitting: Internal Medicine

## 2017-05-15 ENCOUNTER — Ambulatory Visit (INDEPENDENT_AMBULATORY_CARE_PROVIDER_SITE_OTHER): Payer: Medicare Other | Admitting: Internal Medicine

## 2017-05-15 VITALS — BP 122/78 | HR 67 | Temp 98.4°F | Ht 71.0 in | Wt 205.0 lb

## 2017-05-15 DIAGNOSIS — E039 Hypothyroidism, unspecified: Secondary | ICD-10-CM

## 2017-05-15 DIAGNOSIS — R7989 Other specified abnormal findings of blood chemistry: Secondary | ICD-10-CM

## 2017-05-15 DIAGNOSIS — K5792 Diverticulitis of intestine, part unspecified, without perforation or abscess without bleeding: Secondary | ICD-10-CM

## 2017-05-15 LAB — T4, FREE: Free T4: 0.7 ng/dL (ref 0.60–1.60)

## 2017-05-15 LAB — TSH: TSH: 3.02 u[IU]/mL (ref 0.35–4.50)

## 2017-05-15 NOTE — Progress Notes (Signed)
Subjective:  Patient ID: Jacob [name], male    DOB: [birth year]  Age: 67 y.o. MRN: [id number]  CC: No chief complaint on file.   HPI Jacob [name] presents for diverticulitis f/u and abn TSH  Outpatient Medications Prior to Visit  Medication Sig Dispense Refill  . amoxicillin (AMOXIL) 250 MG capsule Take 1,000 mg by mouth once.    Marland Kitchen aspirin EC 81 MG tablet Take 81 mg by mouth every evening.     . cholecalciferol (VITAMIN D) 1000 UNITS tablet Take 1,000 Units by mouth every evening.     Marland Kitchen glucosamine-chondroitin 500-400 MG tablet Take 2 tablets by mouth every evening.    Marland Kitchen levothyroxine (SYNTHROID, LEVOTHROID) 50 MCG tablet Take 1 tablet (50 mcg total) by mouth daily. 90 tablet 3  . Multiple Vitamins-Minerals (MULTIVITAL PO) Take 1 tablet by mouth every evening.     . Omega-3 Fatty Acids (FISH OIL) 1000 MG CAPS Take 2,000 mg by mouth every evening.     Marland Kitchen OVER THE COUNTER MEDICATION Take 500 mg by mouth every evening. Lysin tab once daily    . amoxicillin-clavulanate (AUGMENTIN) 875-125 MG tablet Take 1 tablet by mouth 2 (two) times daily. 20 tablet 0  . metroNIDAZOLE (FLAGYL) 500 MG tablet Take 1 tablet (500 mg total) by mouth 3 (three) times daily. 21 tablet 0   No facility-administered medications prior to visit.     ROS Review of Systems  Constitutional: Negative for appetite change, fatigue and unexpected weight change.  HENT: Negative for congestion, nosebleeds, sneezing, sore throat and trouble swallowing.   Eyes: Negative for itching and visual disturbance.  Respiratory: Negative for cough.   Cardiovascular: Negative for chest pain, palpitations and leg swelling.  Gastrointestinal: Negative for abdominal distention, blood in stool, diarrhea and nausea.  Genitourinary: Negative for frequency and hematuria.  Musculoskeletal: Negative for back pain, gait problem, joint swelling and neck pain.  Skin: Negative for rash.  Neurological: Negative for dizziness, tremors,  speech difficulty and weakness.  Psychiatric/Behavioral: Negative for agitation, dysphoric mood and sleep disturbance. The patient is not nervous/anxious.     Objective:  BP 122/78 (BP Location: Left Arm, Patient Position: Sitting, Cuff Size: Large)   Pulse 67   Temp 98.4 F (36.9 C) (Oral)   Ht 5\' 11"  (1.803 m)   Wt 205 lb (93 kg)   SpO2 98%   BMI 28.59 kg/m   BP Readings from Last 3 Encounters:  05/15/17 122/78  04/11/17 122/76  12/25/16 128/86    Wt Readings from Last 3 Encounters:  05/15/17 205 lb (93 kg)  04/11/17 203 lb (92.1 kg)  12/25/16 201 lb 1.9 oz (91.2 kg)    Physical Exam  Constitutional: He is oriented to person, place, and time. He appears well-developed. No distress.  NAD  HENT:  Mouth/Throat: Oropharynx is clear and moist.  Eyes: Pupils are equal, round, and reactive to light. Conjunctivae are normal.  Neck: Normal range of motion. No JVD present. No thyromegaly present.  Cardiovascular: Normal rate, regular rhythm, normal heart sounds and intact distal pulses. Exam reveals no gallop and no friction rub.  No murmur heard. Pulmonary/Chest: Effort normal and breath sounds normal. No respiratory distress. He has no wheezes. He has no rales. He exhibits no tenderness.  Abdominal: Soft. Bowel sounds are normal. He exhibits no distension and no mass. There is no tenderness. There is no rebound and no guarding.  Musculoskeletal: Normal range of motion. He exhibits no edema or tenderness.  Lymphadenopathy:  He has no cervical adenopathy.  Neurological: He is alert and oriented to person, place, and time. He has normal reflexes. No cranial nerve deficit. He exhibits normal muscle tone. He displays a negative Romberg sign. Coordination and gait normal.  Skin: Skin is warm and dry. No rash noted.  Psychiatric: He has a normal mood and affect. His behavior is normal. Judgment and thought content normal.    Lab Results  Component Value Date   WBC 9.0 04/11/2017     HGB 15.7 04/11/2017   HCT 46.0 04/11/2017   PLT 200.0 04/11/2017   GLUCOSE 90 04/11/2017   CHOL 184 12/28/2016   TRIG 156.0 (H) 12/28/2016   HDL 50.40 12/28/2016   LDLDIRECT 126.2 10/24/2011   LDLCALC 103 (H) 12/28/2016   ALT 11 12/28/2016   AST 17 12/28/2016   NA 137 04/11/2017   K 4.1 04/11/2017   CL 102 04/11/2017   CREATININE 0.97 04/11/2017   BUN 11 04/11/2017   CO2 29 04/11/2017   TSH 4.64 (H) 04/11/2017   PSA 4.45 (H) 10/31/2015   INR 0.95 08/24/2016   HGBA1C 5.6 04/29/2013    No results found.  Assessment & Plan:   There are no diagnoses linked to this encounter. I have discontinued Jacob [name] "ALEX"'s amoxicillin-clavulanate and metroNIDAZOLE. I am also having him maintain his Fish Oil, Multiple Vitamins-Minerals (MULTIVITAL PO), OVER THE COUNTER MEDICATION, aspirin EC, cholecalciferol, glucosamine-chondroitin, amoxicillin, and levothyroxine.  No orders of the defined types were placed in this encounter.    Follow-up: No follow-ups on file.  Walker Kehr, MD

## 2017-05-15 NOTE — Assessment & Plan Note (Signed)
TSH, FT4 

## 2017-05-15 NOTE — Assessment & Plan Note (Signed)
All sx's resolved

## 2017-06-06 DIAGNOSIS — C4441 Basal cell carcinoma of skin of scalp and neck: Secondary | ICD-10-CM | POA: Diagnosis not present

## 2017-06-06 DIAGNOSIS — Z85828 Personal history of other malignant neoplasm of skin: Secondary | ICD-10-CM | POA: Diagnosis not present

## 2017-06-19 DIAGNOSIS — C61 Malignant neoplasm of prostate: Secondary | ICD-10-CM | POA: Diagnosis not present

## 2017-06-25 ENCOUNTER — Ambulatory Visit: Payer: Medicare Other | Admitting: Internal Medicine

## 2017-06-26 DIAGNOSIS — N401 Enlarged prostate with lower urinary tract symptoms: Secondary | ICD-10-CM | POA: Diagnosis not present

## 2017-06-26 DIAGNOSIS — C61 Malignant neoplasm of prostate: Secondary | ICD-10-CM | POA: Diagnosis not present

## 2017-06-26 DIAGNOSIS — R3912 Poor urinary stream: Secondary | ICD-10-CM | POA: Diagnosis not present

## 2017-08-09 ENCOUNTER — Other Ambulatory Visit: Payer: Medicare Other | Admitting: Internal Medicine

## 2017-11-13 ENCOUNTER — Ambulatory Visit (INDEPENDENT_AMBULATORY_CARE_PROVIDER_SITE_OTHER): Payer: Medicare Other | Admitting: Internal Medicine

## 2017-11-13 ENCOUNTER — Encounter: Payer: Self-pay | Admitting: Internal Medicine

## 2017-11-13 ENCOUNTER — Other Ambulatory Visit (INDEPENDENT_AMBULATORY_CARE_PROVIDER_SITE_OTHER): Payer: Medicare Other

## 2017-11-13 DIAGNOSIS — E039 Hypothyroidism, unspecified: Secondary | ICD-10-CM

## 2017-11-13 DIAGNOSIS — I471 Supraventricular tachycardia: Secondary | ICD-10-CM | POA: Diagnosis not present

## 2017-11-13 LAB — BASIC METABOLIC PANEL
BUN: 17 mg/dL (ref 6–23)
CO2: 26 mEq/L (ref 19–32)
Calcium: 9.5 mg/dL (ref 8.4–10.5)
Chloride: 104 mEq/L (ref 96–112)
Creatinine, Ser: 1.03 mg/dL (ref 0.40–1.50)
GFR: 76.4 mL/min (ref >60.00)
Glucose, Bld: 87 mg/dL (ref 70–99)
Potassium: 3.9 mEq/L (ref 3.5–5.1)
Sodium: 137 mEq/L (ref 135–145)

## 2017-11-13 LAB — TSH: TSH: 3.05 u[IU]/mL (ref 0.35–4.50)

## 2017-11-13 LAB — T4, FREE: Free T4: 0.87 ng/dL (ref 0.60–1.60)

## 2017-11-13 NOTE — Patient Instructions (Signed)

## 2017-11-13 NOTE — Progress Notes (Signed)
Subjective:  Patient ID: Jacob [name], male    DOB: 04-Jul-1950  Age: 67 y.o. MRN: [id number]  CC: No chief complaint on file.   HPI Eshawn Coor presents for hypothyroidism C/o groin strain x 2, no relapse  Outpatient Medications Prior to Visit  Medication Sig Dispense Refill  . amoxicillin (AMOXIL) 500 MG tablet TK 4 TS PO 1 HOUR PRIOR TO TREATMENT  0  . cholecalciferol (VITAMIN D) 1000 UNITS tablet Take 1,000 Units by mouth every evening.     Marland Kitchen glucosamine-chondroitin 500-400 MG tablet Take 2 tablets by mouth every evening.    Marland Kitchen levothyroxine (SYNTHROID, LEVOTHROID) 50 MCG tablet Take 1 tablet (50 mcg total) by mouth daily. 90 tablet 3  . Multiple Vitamins-Minerals (MULTIVITAL PO) Take 1 tablet by mouth every evening.     . Omega-3 Fatty Acids (FISH OIL) 1000 MG CAPS Take 2,000 mg by mouth every evening.     Marland Kitchen OVER THE COUNTER MEDICATION Take 500 mg by mouth every evening. Lysin tab once daily    . amoxicillin (AMOXIL) 250 MG capsule Take 1,000 mg by mouth once.    Marland Kitchen aspirin EC 81 MG tablet Take 81 mg by mouth every evening.      No facility-administered medications prior to visit.     ROS: Review of Systems  Constitutional: Negative for appetite change, fatigue and unexpected weight change.  HENT: Negative for congestion, nosebleeds, sneezing, sore throat and trouble swallowing.   Eyes: Negative for itching and visual disturbance.  Respiratory: Negative for cough.   Cardiovascular: Negative for chest pain, palpitations and leg swelling.  Gastrointestinal: Negative for abdominal distention, blood in stool, diarrhea and nausea.  Genitourinary: Negative for frequency and hematuria.  Musculoskeletal: Negative for back pain, gait problem, joint swelling and neck pain.  Skin: Negative for rash.  Neurological: Negative for dizziness, tremors, speech difficulty and weakness.  Psychiatric/Behavioral: Negative for agitation, dysphoric mood, sleep disturbance and suicidal  ideas. The patient is not nervous/anxious.     Objective:  BP 132/82 (BP Location: Left Arm, Patient Position: Sitting, Cuff Size: Normal)   Pulse (!) 54   Temp 97.9 F (36.6 C) (Oral)   Ht 5\' 11"  (1.803 m)   Wt 203 lb (92.1 kg)   SpO2 97%   BMI 28.31 kg/m   BP Readings from Last 3 Encounters:  11/13/17 132/82  05/15/17 122/78  04/11/17 122/76    Wt Readings from Last 3 Encounters:  11/13/17 203 lb (92.1 kg)  05/15/17 205 lb (93 kg)  04/11/17 203 lb (92.1 kg)    Physical Exam  Constitutional: He is oriented to person, place, and time. He appears well-developed. No distress.  NAD  HENT:  Mouth/Throat: Oropharynx is clear and moist.  Eyes: Pupils are equal, round, and reactive to light. Conjunctivae are normal.  Neck: Normal range of motion. No JVD present. No thyromegaly present.  Cardiovascular: Normal rate, regular rhythm, normal heart sounds and intact distal pulses. Exam reveals no gallop and no friction rub.  No murmur heard. Pulmonary/Chest: Effort normal and breath sounds normal. No respiratory distress. He has no wheezes. He has no rales. He exhibits no tenderness.  Abdominal: Soft. Bowel sounds are normal. He exhibits no distension and no mass. There is no tenderness. There is no rebound and no guarding.  Musculoskeletal: Normal range of motion. He exhibits no edema or tenderness.  Lymphadenopathy:    He has no cervical adenopathy.  Neurological: He is alert and oriented to person, place, and time. He  has normal reflexes. No cranial nerve deficit. He exhibits normal muscle tone. He displays a negative Romberg sign. Coordination and gait normal.  Skin: Skin is warm and dry. No rash noted.  Psychiatric: He has a normal mood and affect. His behavior is normal. Judgment and thought content normal.    Lab Results  Component Value Date   WBC 9.0 04/11/2017   HGB 15.7 04/11/2017   HCT 46.0 04/11/2017   PLT 200.0 04/11/2017   GLUCOSE 90 04/11/2017   CHOL 184  12/28/2016   TRIG 156.0 (H) 12/28/2016   HDL 50.40 12/28/2016   LDLDIRECT 126.2 10/24/2011   LDLCALC 103 (H) 12/28/2016   ALT 11 12/28/2016   AST 17 12/28/2016   NA 137 04/11/2017   K 4.1 04/11/2017   CL 102 04/11/2017   CREATININE 0.97 04/11/2017   BUN 11 04/11/2017   CO2 29 04/11/2017   TSH 3.02 05/15/2017   PSA 4.45 (H) 10/31/2015   INR 0.95 08/24/2016   HGBA1C 5.6 04/29/2013    No results found.  Assessment & Plan:   There are no diagnoses linked to this encounter.   No orders of the defined types were placed in this encounter.    Follow-up: No follow-ups on file.  Walker Kehr, MD

## 2017-11-13 NOTE — Assessment & Plan Note (Signed)
On Levothyroxine 

## 2017-11-13 NOTE — Assessment & Plan Note (Signed)
No relapse 

## 2017-11-19 ENCOUNTER — Telehealth: Payer: Self-pay

## 2017-11-19 DIAGNOSIS — E785 Hyperlipidemia, unspecified: Secondary | ICD-10-CM

## 2017-11-19 NOTE — Telephone Encounter (Signed)
Pt called and would like to proceed with the Cardiac CT calcium scoring test

## 2017-11-22 NOTE — Telephone Encounter (Signed)
Ordered.  Thx

## 2017-12-09 DIAGNOSIS — L570 Actinic keratosis: Secondary | ICD-10-CM | POA: Diagnosis not present

## 2017-12-09 DIAGNOSIS — L438 Other lichen planus: Secondary | ICD-10-CM | POA: Diagnosis not present

## 2017-12-09 DIAGNOSIS — Z85828 Personal history of other malignant neoplasm of skin: Secondary | ICD-10-CM | POA: Diagnosis not present

## 2017-12-09 DIAGNOSIS — D2371 Other benign neoplasm of skin of right lower limb, including hip: Secondary | ICD-10-CM | POA: Diagnosis not present

## 2017-12-11 ENCOUNTER — Ambulatory Visit (INDEPENDENT_AMBULATORY_CARE_PROVIDER_SITE_OTHER)
Admission: RE | Admit: 2017-12-11 | Discharge: 2017-12-11 | Disposition: A | Discharge status: Home / Self Care | Payer: Self-pay | Source: Ambulatory Visit | Attending: Internal Medicine | Admitting: Internal Medicine

## 2017-12-11 DIAGNOSIS — E785 Hyperlipidemia, unspecified: Secondary | ICD-10-CM

## 2017-12-16 ENCOUNTER — Ambulatory Visit (INDEPENDENT_AMBULATORY_CARE_PROVIDER_SITE_OTHER): Payer: Medicare Other | Admitting: Orthopaedic Surgery

## 2017-12-16 ENCOUNTER — Encounter (INDEPENDENT_AMBULATORY_CARE_PROVIDER_SITE_OTHER): Payer: Self-pay | Admitting: Orthopaedic Surgery

## 2017-12-16 ENCOUNTER — Other Ambulatory Visit: Payer: Self-pay | Admitting: Internal Medicine

## 2017-12-16 ENCOUNTER — Ambulatory Visit (INDEPENDENT_AMBULATORY_CARE_PROVIDER_SITE_OTHER): Payer: Medicare Other

## 2017-12-16 VITALS — BP 134/91 | HR 58 | Ht 71.0 in | Wt 195.0 lb

## 2017-12-16 DIAGNOSIS — K7689 Other specified diseases of liver: Secondary | ICD-10-CM

## 2017-12-16 DIAGNOSIS — M25551 Pain in right hip: Secondary | ICD-10-CM

## 2017-12-16 DIAGNOSIS — I2510 Atherosclerotic heart disease of native coronary artery without angina pectoris: Secondary | ICD-10-CM

## 2017-12-16 DIAGNOSIS — I2584 Coronary atherosclerosis due to calcified coronary lesion: Secondary | ICD-10-CM
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
## 2017-12-18 ENCOUNTER — Ambulatory Visit
Admission: RE | Admit: 2017-12-18 | Discharge: 2017-12-18 | Disposition: A | Discharge status: Home / Self Care | Payer: Medicare Other | Source: Ambulatory Visit | Attending: Orthopaedic Surgery | Admitting: Orthopaedic Surgery

## 2017-12-18 DIAGNOSIS — M25551 Pain in right hip: Secondary | ICD-10-CM

## 2017-12-18 DIAGNOSIS — M1611 Unilateral primary osteoarthritis, right hip: Secondary | ICD-10-CM | POA: Diagnosis not present

## 2017-12-20 ENCOUNTER — Telehealth: Payer: Self-pay | Admitting: Internal Medicine

## 2017-12-20 NOTE — Telephone Encounter (Signed)
Copied from [place] [id number]. Topic: Quick Communication - Lab Results (Clinic Use ONLY) >> Dec 19, 2017  4:41 PM Karren Cobble, CMA wrote: Called patient to inform them of CT results. When patient returns call, triage nurse may disclose results.

## 2017-12-20 NOTE — Telephone Encounter (Signed)
Charted in result notes. 

## 2017-12-23 ENCOUNTER — Encounter: Payer: Self-pay | Admitting: Cardiovascular Disease

## 2017-12-23 ENCOUNTER — Ambulatory Visit (INDEPENDENT_AMBULATORY_CARE_PROVIDER_SITE_OTHER): Payer: Medicare Other | Admitting: Cardiovascular Disease

## 2017-12-23 VITALS — BP 126/80 | HR 49 | Ht 71.0 in | Wt 204.0 lb

## 2017-12-23 DIAGNOSIS — I77810 Thoracic aortic ectasia: Secondary | ICD-10-CM

## 2017-12-23 DIAGNOSIS — I7121 Aneurysm of the ascending aorta, without rupture: Secondary | ICD-10-CM | POA: Insufficient documentation

## 2017-12-23 DIAGNOSIS — R931 Abnormal findings on diagnostic imaging of heart and coronary circulation: Secondary | ICD-10-CM | POA: Diagnosis not present

## 2017-12-23 DIAGNOSIS — I471 Supraventricular tachycardia: Secondary | ICD-10-CM | POA: Diagnosis not present

## 2017-12-23 DIAGNOSIS — I712 Thoracic aortic aneurysm, without rupture: Secondary | ICD-10-CM | POA: Insufficient documentation

## 2017-12-23 DIAGNOSIS — E7800 Pure hypercholesterolemia, unspecified: Secondary | ICD-10-CM

## 2017-12-23 NOTE — Patient Instructions (Signed)
Dr Croitoru recommends that you follow-up with him as needed. 

## 2017-12-23 NOTE — Progress Notes (Signed)
Cardiology Consultation Note:    Date:  12/23/2017   ID:  Jacob [name], DOB [birth year], MRN [id number]  PCP:  Cassandria Anger, MD  Cardiologist:  No primary care provider on file.  Electrophysiologist:  None   Referring MD: Cassandria Anger, MD   No chief complaint on file. Jacob [name] is a 67 y.o. male who is being seen today for the evaluation of elevated calcium score and dilated ascending aorta at the request of Plotnikov, Evie Lacks, MD.   History of Present Illness:    [name] is a 67 y.o. male with a hx of generally good health and athletic lifestyle, supraventricular tachycardia, referred to discuss findings on recent noncontrast CT of the chest for calcium score.  The calcium score was 188 which places him in the 61st percentile for age/gender.  In addition he was described as having a dilated ascending aorta, measured at 3.8 cm.  He has questions regarding the use of aspirin based on these findings.  He has always been very fit.  He was known to sometimes ride his bicycle for 200 miles a week.  Recently he has slowed down a bit, but still rides his bicycle at least twice a week, walks for exercise and plays tennis.  All added, exercises for about 4 hours a week.  He stopped taking daily aspirin after he had a "groin pull" and some adductor femoris area hematomas after playing tennis.  This decision was also impacted by hearing the news about the lack of efficacy of aspirin for primary prevention.  He continues have occasional palpitations these are felt as a rapid vibratory sensation in his heart usually when he is about to fall asleep or when he is just waking up.  They only last for a handful of seconds.  He does not think he would have enough time to catch them with an event-triggered device such as an Apple watch or Kardia.  He does not have a history of stroke, TIA, myocardial infarction or vascular disease.  About 4 years ago he had an  attempted EP study or ablation of SVT, but the arrhythmia could not be induced (Dr. Lovena Le).  In 2012, following a colonic polypectomy, complicated by bleeding and anemia he had some chest pressure that resolved after transfusion.  The patient specifically denies any chest pain at rest exertion, dyspnea at rest or with exertion, orthopnea, paroxysmal nocturnal dyspnea, syncope, focal neurological deficits, intermittent claudication, lower extremity edema, unexplained weight gain, cough, hemoptysis or wheezing.   Past Medical History:  Diagnosis Date  . Eczema    hot tub related  . GI bleed    s/p polypectomy  . Herpes zoster 2010  . Prostate cancer ([place])   . SVT (supraventricular tachycardia) (HCC)     Past Surgical History:  Procedure Laterality Date  . APPENDECTOMY  05/2010  . CYSTOSCOPY N/A 08/30/2016   Procedure: CYSTOSCOPY FLEXIBLE;  Surgeon: Irine Seal, MD;  Location: [facility];  Service: Urology;  Laterality: N/A;  no seeds found in bladder  . ELECTROPHYSIOLOGY STUDY  08/24/13   EPS by Dr Lovena Le with no inducible SVT or evidence of dual AV nodal physiology  . PROSTATE BIOPSY    . RADIOACTIVE SEED IMPLANT N/A 08/30/2016   Procedure: RADIOACTIVE SEED IMPLANT/BRACHYTHERAPY IMPLANT WITH SPACE OAR;  Surgeon: Irine Seal, MD;  Location: [facility];  Service: Urology;  Laterality: N/A;    62   seeds implanted  . ROTATOR CUFF REPAIR  2003  right  . SUPRAVENTRICULAR TACHYCARDIA ABLATION N/A 08/24/2013   Procedure: SUPRAVENTRICULAR TACHYCARDIA ABLATION;  Surgeon: Evans Lance, MD;  Location: [facility] CATH LAB;  Service: Cardiovascular;  Laterality: N/A;    Current Medications: Current Meds  Medication Sig  . amoxicillin (AMOXIL) 500 MG tablet TK 4 TS PO 1 HOUR PRIOR TO TREATMENT  . cholecalciferol (VITAMIN D) 1000 UNITS tablet Take 1,000 Units by mouth every evening.   Marland Kitchen glucosamine-chondroitin 500-400 MG tablet Take 2 tablets by mouth every evening.  Marland Kitchen  levothyroxine (SYNTHROID, LEVOTHROID) 50 MCG tablet Take 1 tablet (50 mcg total) by mouth daily.  . Multiple Vitamins-Minerals (MULTIVITAL PO) Take 1 tablet by mouth every evening.   . Omega-3 Fatty Acids (FISH OIL) 1000 MG CAPS Take 2,000 mg by mouth every evening.   Marland Kitchen OVER THE COUNTER MEDICATION Take 500 mg by mouth every evening. Lysin tab once daily     Allergies:   Pneumovax [pneumococcal polysaccharide vaccine]   Social History   Socioeconomic History  . Marital status: Married    Spouse name: Not on file  . Number of children: Not on file  . Years of education: Not on file  . Highest education level: Not on file  Occupational History  . Not on file  Social Needs  . Financial resource strain: Not on file  . Food insecurity:    Worry: Not on file    Inability: Not on file  . Transportation needs:    Medical: Not on file    Non-medical: Not on file  Tobacco Use  . Smoking status: Passive Smoke Exposure - Never Smoker  . Smokeless tobacco: Never Used  . Tobacco comment: occasional  Substance and Sexual Activity  . Alcohol use: Yes  . Drug use: No  . Sexual activity: Yes  Lifestyle  . Physical activity:    Days per week: Not on file    Minutes per session: Not on file  . Stress: Not on file  Relationships  . Social connections:    Talks on phone: Not on file    Gets together: Not on file    Attends religious service: Not on file    Active member of club or organization: Not on file    Attends meetings of clubs or organizations: Not on file    Relationship status: Not on file  Other Topics Concern  . Not on file  Social History Narrative   Regular exercise- yes, cycling     Family History: The patient's family history includes Cancer in his mother and paternal uncle; Heart attack in his father; Heart disease in his mother.  His father was in his early 48s when he passed away from coronary disease.  He has 3 siblings but one passed away in a plane crash at age 34  the others are healthy.  ROS:   Please see the history of present illness.    All other systems reviewed and are negative.  EKGs/Labs/Other Studies Reviewed:    The following studies were reviewed today: Notes from Dr. Alain Marion, cardiac CT Ca score images  EKG:  EKG is ordered today.  The ekg ordered today demonstrates sinus bradycardia, otherwise normal tracing  Recent Labs: 12/28/2016: ALT 11 04/11/2017: Hemoglobin 15.7; Platelets 200.0 11/13/2017: BUN 17; Creatinine, Ser 1.03; Potassium 3.9; Sodium 137; TSH 3.05  Recent Lipid Panel    Component Value Date/Time   CHOL 184 12/28/2016 0738   TRIG 156.0 (H) 12/28/2016 0738   HDL 50.40 12/28/2016 3007  CHOLHDL 4 12/28/2016 0738   VLDL 31.2 12/28/2016 0738   LDLCALC 103 (H) 12/28/2016 0738   LDLDIRECT 126.2 10/24/2011 0737    Physical Exam:    VS:  BP 126/80 Comment: right arm  Pulse (!) 49   Ht 5\' 11"  (1.803 m)   Wt 204 lb (92.5 kg)   BMI 28.45 kg/m     Wt Readings from Last 3 Encounters:  12/23/17 204 lb (92.5 kg)  12/16/17 195 lb (88.5 kg)  11/13/17 203 lb (92.1 kg)     GEN:  Well nourished, well developed in no acute distress HEENT: Normal NECK: No JVD; No carotid bruits LYMPHATICS: No lymphadenopathy CARDIAC: RRR, no murmurs, rubs, gallops RESPIRATORY:  Clear to auscultation without rales, wheezing or rhonchi  ABDOMEN: Soft, non-tender, non-distended MUSCULOSKELETAL:  No edema; No deformity  SKIN: Warm and dry NEUROLOGIC:  Alert and oriented x 3 PSYCHIATRIC:  Normal affect   ASSESSMENT:    1. Elevated coronary artery calcium score   2. Hypercholesterolemia   3. Mild dilation of ascending aorta (HCC)   4. SVT (supraventricular tachycardia) (HCC)    PLAN:    In order of problems listed above:  1. Increased Ca score: His calcium score is slightly above the immediate he does portend a slight increase in 10-year cardiovascular risk.  Having said that, he does not have a lot of modifiable coronary risk  factors other than his weight.  He exercises a lot and I encouraged him to keep it up.  He is performing aerobic exercise which should have the best impact on his lipid profile and will not put his ascending aorta ain any peril.  His diet could improve, especially since he has a weakness for unhealthy snacks such as potato chips.  Suggested a goal of 15 pounds weight loss over the next year. 2. HLP: Based on his risk factors and 10-year cardiovascular risk I would set a target LDL cholesterol at less than 100.  His LDL is only 103 and I am confident he could bring it down substantially by improving his diet losing weight.  We talked about methods to achieve this.  I would recommend rechecking his lipid profile in 12 months and would only discuss statin therapy if his LDL is still above 100. 3. Dilated Ao: We discussed the inherent overestimation of the diameter of the ascending aorta on a study that was not performed with intra-venous contrast.  2 measurements were performed by 2 separate imagers leading to diameters of 3.8 cm and 4.0 cm respectively.  Also need to keep in mind that he has a strongly built individual and that it is not inappropriate for his aorta to be towards the upper limit of normal which it is at 3.8-4.0 cm.  He does not have an aortic aneurysm.  He does not have a murmur of aortic insufficiency.  At this point I do not think further imaging is necessary.  Could consider a repeat evaluation with a contrast CT angiogram in 3-5 years. 4. SVT: His palpitations are really infrequent, brief and not particularly symptomatic.  It would be hard to catch them with an event monitor device there is some brief and unheralded.  It does not sound like a bad enough to warrant implantation of a loop recorder.  It does not do not appear to warrant medications.   Medication Adjustments/Labs and Tests Ordered: Current medicines are reviewed at length with the patient today.  Concerns regarding medicines are  outlined above.  Orders  Placed This Encounter  Procedures  . EKG 12-Lead   No orders of the defined types were placed in this encounter.   Patient Instructions  Dr Sallyanne Kuster recommends that you follow-up with him as needed.    Signed, Sanda Klein, MD  12/23/2017 5:47 PM    [place]

## 2017-12-24 ENCOUNTER — Ambulatory Visit
Admission: RE | Admit: 2017-12-24 | Discharge: 2017-12-24 | Disposition: A | Discharge status: Home / Self Care | Payer: Medicare Other | Source: Ambulatory Visit | Attending: Internal Medicine | Admitting: Internal Medicine

## 2017-12-24 DIAGNOSIS — K7689 Other specified diseases of liver: Secondary | ICD-10-CM | POA: Diagnosis not present

## 2017-12-25 DIAGNOSIS — C61 Malignant neoplasm of prostate: Secondary | ICD-10-CM | POA: Diagnosis not present

## 2017-12-30 ENCOUNTER — Other Ambulatory Visit: Payer: Self-pay

## 2017-12-30 ENCOUNTER — Other Ambulatory Visit (INDEPENDENT_AMBULATORY_CARE_PROVIDER_SITE_OTHER): Payer: Self-pay | Admitting: Radiology

## 2017-12-30 ENCOUNTER — Ambulatory Visit (INDEPENDENT_AMBULATORY_CARE_PROVIDER_SITE_OTHER): Payer: Medicare Other | Admitting: Orthopaedic Surgery

## 2017-12-30 ENCOUNTER — Encounter (INDEPENDENT_AMBULATORY_CARE_PROVIDER_SITE_OTHER): Payer: Self-pay | Admitting: Orthopaedic Surgery

## 2017-12-30 VITALS — BP 135/84 | HR 59 | Ht 71.0 in | Wt 195.0 lb

## 2017-12-30 DIAGNOSIS — I2584 Coronary atherosclerosis due to calcified coronary lesion: Secondary | ICD-10-CM | POA: Diagnosis not present

## 2017-12-30 DIAGNOSIS — I2510 Atherosclerotic heart disease of native coronary artery without angina pectoris: Secondary | ICD-10-CM

## 2017-12-30 DIAGNOSIS — M25551 Pain in right hip: Secondary | ICD-10-CM

## 2017-12-30 NOTE — Progress Notes (Signed)
Office Visit Note   Patient: Jacob [name]           Date of Birth: 1950-04-17           MRN: [id number] Visit Date: 12/30/2017              Requested by: Cassandria Anger, MD [place], [place] [id number] PCP: Cassandria Anger, MD   Assessment & Plan: Visit Diagnoses:  1. Pain of right hip joint     Plan: MRI scan demonstrates moderate osteoarthritis right hip and mild left hip  osteoarthritis .  Believe his pain is related to the arthritis as he is experiencing groin pain with increased activity.  I think from a diagnostic and therapeutic standpoint that an intra-articular cortisone injection would be helpful.  Discussed at length  Follow-Up Instructions: Return if symptoms worsen or fail to improve.   Orders:  No orders of the defined types were placed in this encounter.  No orders of the defined types were placed in this encounter.     Procedures: No procedures performed   Clinical Data: No additional findings.   Subjective: Chief Complaint  Patient presents with  . Follow-up    R HIP PAIN F/U BEEN WORKING ON EXERCISES AFTER WALKING 2 MILES STARTS FEELING PAIN ON FRONT THIGH AREA SYMPTOMS THE SAME  No change in symptoms referable to right hip.  Prior films of his hip revealed no significant changes with possibly slight arthritic change  HPI  Review of Systems  Constitutional: Negative for fatigue and fever.  HENT: Negative for ear pain.   Eyes: Negative for pain.  Respiratory: Negative for cough and shortness of breath.   Cardiovascular: Negative for leg swelling.  Gastrointestinal: Negative for constipation and diarrhea.  Genitourinary: Negative for difficulty urinating.  Musculoskeletal: Negative for back pain and neck pain.  Skin: Negative for rash.  Allergic/Immunologic: Negative for food allergies.  Neurological: Positive for weakness. Negative for numbness.  Hematological: Does not bruise/bleed easily.  Psychiatric/Behavioral:  Positive for sleep disturbance.     Objective: Vital Signs: BP 135/84 (BP Location: Left Arm, Patient Position: Sitting, Cuff Size: Normal)   Pulse (!) 59   Ht 5\' 11"  (1.803 m)   Wt 195 lb (88.5 kg)   BMI 27.20 kg/m   Physical Exam  Constitutional: He is oriented to person, place, and time. He appears well-developed and well-nourished.  HENT:  Mouth/Throat: Oropharynx is clear and moist.  Eyes: Pupils are equal, round, and reactive to light. EOM are normal.  Pulmonary/Chest: Effort normal.  Neurological: He is alert and oriented to person, place, and time.  Skin: Skin is warm and dry.  Psychiatric: He has a normal mood and affect. His behavior is normal.    Ortho Exam awake alert and oriented x3.  Comfortable sitting.  Does not have any significant pain with internal or external rotation of his right or left hip.  Leg lengths appear to be symmetrical.  Walks without a limp.  Straight leg raise negative.  Specialty Comments:  No specialty comments available.  Imaging: No results found.   PMFS History: Patient Active Problem List   Diagnosis Date Noted  . Elevated coronary artery calcium score 12/23/2017  . Hypercholesterolemia 12/23/2017  . Mild dilation of ascending aorta (HCC) 12/23/2017  . Diverticulitis 04/11/2017  . Prostate cancer ([place]) 11/01/2015  . Hypothyroidism 11/01/2015  . SVT (supraventricular tachycardia) ([place]) 08/19/2013  . Actinic keratoses 10/31/2011  . Fatigue 10/31/2011  . Well adult exam  10/30/2011  . Chest pain, unspecified 01/01/2011  . Phlebitis and thrombophlebitis of superficial veins of upper extremities 12/20/2010  . Anemia due to blood loss, acute 12/11/2010  . Colon polyp 12/11/2010  . PALPITATIONS, OCCASIONAL 01/11/2009  . HERPES ZOSTER 08/13/2008  . LOW BACK PAIN 08/13/2008  . LEG PAIN, RIGHT 08/13/2008  . CBC, ABNORMAL 04/01/2007   Past Medical History:  Diagnosis Date  . Eczema    hot tub related  . GI bleed    s/p  polypectomy  . Herpes zoster 2010  . Prostate cancer ([place])   . SVT (supraventricular tachycardia) (HCC)     Family History  Problem Relation Age of Onset  . Heart disease Mother        pacemaker  . Cancer Mother        lung/smoker/passed at age 63  . Heart attack Father   . Cancer Paternal Uncle        pancreatic    Past Surgical History:  Procedure Laterality Date  . APPENDECTOMY  05/2010  . CYSTOSCOPY N/A 08/30/2016   Procedure: CYSTOSCOPY FLEXIBLE;  Surgeon: Irine Seal, MD;  Location: [facility];  Service: Urology;  Laterality: N/A;  no seeds found in bladder  . ELECTROPHYSIOLOGY STUDY  08/24/13   EPS by Dr Lovena Le with no inducible SVT or evidence of dual AV nodal physiology  . PROSTATE BIOPSY    . RADIOACTIVE SEED IMPLANT N/A 08/30/2016   Procedure: RADIOACTIVE SEED IMPLANT/BRACHYTHERAPY IMPLANT WITH SPACE OAR;  Surgeon: Irine Seal, MD;  Location[facility];  Service: Urology;  Laterality: N/A;    62   seeds implanted  . ROTATOR CUFF REPAIR  2003   right  . SUPRAVENTRICULAR TACHYCARDIA ABLATION N/A 08/24/2013   Procedure: SUPRAVENTRICULAR TACHYCARDIA ABLATION;  Surgeon: Evans Lance, MD;  Location: [facility] CATH LAB;  Service: Cardiovascular;  Laterality: N/A;   Social History   Occupational History  . Not on file  Tobacco Use  . Smoking status: Passive Smoke Exposure - Never Smoker  . Smokeless tobacco: Never Used  . Tobacco comment: occasional  Substance and Sexual Activity  . Alcohol use: Yes  . Drug use: No  . Sexual activity: Yes

## 2018-01-01 DIAGNOSIS — C61 Malignant neoplasm of prostate: Secondary | ICD-10-CM | POA: Diagnosis not present

## 2018-01-01 DIAGNOSIS — N401 Enlarged prostate with lower urinary tract symptoms: Secondary | ICD-10-CM | POA: Diagnosis not present

## 2018-01-01 DIAGNOSIS — R3912 Poor urinary stream: Secondary | ICD-10-CM | POA: Diagnosis not present

## 2018-01-14 ENCOUNTER — Ambulatory Visit
Admission: RE | Admit: 2018-01-14 | Discharge: 2018-01-14 | Disposition: A | Discharge status: Home / Self Care | Payer: Medicare Other | Source: Ambulatory Visit | Attending: Orthopaedic Surgery | Admitting: Orthopaedic Surgery

## 2018-01-14 DIAGNOSIS — M25551 Pain in right hip: Secondary | ICD-10-CM

## 2018-01-14 MED ORDER — METHYLPREDNISOLONE ACETATE 40 MG/ML INJ SUSP (RADIOLOG
120.0000 mg | Freq: Once | INTRAMUSCULAR | Status: AC
  Administered 2018-01-14: 120 mg via INTRA_ARTICULAR

## 2018-01-14 MED ORDER — IOPAMIDOL (ISOVUE-M 200) INJECTION 41%
1.0000 mL | Freq: Once | INTRAMUSCULAR | Status: AC
  Administered 2018-01-14: 1 mL via INTRA_ARTICULAR

## 2018-03-24 ENCOUNTER — Telehealth (INDEPENDENT_AMBULATORY_CARE_PROVIDER_SITE_OTHER): Payer: Self-pay | Admitting: Orthopaedic Surgery

## 2018-03-31 ENCOUNTER — Ambulatory Visit (INDEPENDENT_AMBULATORY_CARE_PROVIDER_SITE_OTHER): Payer: Medicare Other | Admitting: Orthopedic Surgery

## 2018-03-31 ENCOUNTER — Encounter (INDEPENDENT_AMBULATORY_CARE_PROVIDER_SITE_OTHER): Payer: Self-pay | Admitting: Orthopedic Surgery

## 2018-03-31 DIAGNOSIS — M1611 Unilateral primary osteoarthritis, right hip: Secondary | ICD-10-CM | POA: Diagnosis not present

## 2018-04-04 ENCOUNTER — Encounter (INDEPENDENT_AMBULATORY_CARE_PROVIDER_SITE_OTHER): Payer: Self-pay | Admitting: Orthopedic Surgery

## 2018-04-04 NOTE — Progress Notes (Signed)
Office Visit Note   Patient: Jacob [name]           Date of Birth: [birth year]           MRN: [id number] Visit Date: 03/31/2018 Requested by: Cassandria Anger, MD [place], [place] [id number] PCP: Cassandria Anger, MD  Subjective: Chief Complaint  Patient presents with  . Right Hip - Pain    HPI: Jacob [name] is a patient with right hip pain.  He had a cycling injury a couple of years ago.  This was more of a abduction type injury to the hip joint.  The pain actually got better but it is flared back up recently.  Now the pain comes and goes.  He has no pain with normal activity but does ache some after he walks about a mile and 1/2 to 2 miles.  Reports occasional stiffness after exercise.  He does report anterior type thigh pain with some radiation down the leg but not below the knee.  He had a previous hip joint injection at [facility] on 02/10/2018 without much relief.  He does report a bit of catching and grabbing and popping in his hip.  MRI scan has been performed which shows moderate arthritis of that hip joint.  I reviewed with him and he does have moderate arthritis but no hip effusion.  He also has a fair amount of tendinosis in the gluteal musculature attaching to the trochanteric region.  He states his pain is about level 4 out of 10 after prolonged walking.  He is able to do stairs well.  He is able to cycle also without much difficulty.              ROS: All systems reviewed are negative as they relate to the chief complaint within the history of present illness.  Patient denies  fevers or chills.   Assessment & Plan: Visit Diagnoses:  1. Arthritis of right hip     Plan: Impression is right hip pain which looks like it is more tendinosis related as opposed to arthritis related.  Again his hip is not too irritable today on exam and there is no effusion on the MRI scan.  I think most of his symptoms are likely coming from a combination of arthritis and  tendinitis.  He is fairly functional at this time and has a good frame of reference in regards to his overall health.  I think based on his description of his symptoms after long walks that it is likely more of an arthritic type problem.  For now he wants to live with what he has.  I think he has had a great work-up to this point.  Taking occasional anti-inflammatories is indicated for high activity days.  I think he may also have to adjust the amount of walking he does if that gives him trouble after a mile and 1/2 to 2 miles.  I will see him back as needed.  This does not look like radicular pain from the back.  Follow-Up Instructions: Return if symptoms worsen or fail to improve.   Orders:  No orders of the defined types were placed in this encounter.  No orders of the defined types were placed in this encounter.     Procedures: No procedures performed   Clinical Data: No additional findings.  Objective: Vital Signs: There were no vitals taken for this visit.  Physical Exam:   Constitutional: Patient appears well-developed HEENT:  Head: Normocephalic  Eyes:EOM are normal Neck: Normal range of motion Cardiovascular: Normal rate Pulmonary/chest: Effort normal Neurologic: Patient is alert Skin: Skin is warm Psychiatric: Patient has normal mood and affect    Ortho Exam: Ortho exam demonstrates normal gait alignment.  He has good ankle dorsiflexion plantarflexion quad and hamstring strength.  No real groin pain or restricted range of motion with internal extra rotation of that right hip.  Not much in the way of pain with resisted adduction hip flexion or abduction.  Pedal pulses palpable.  No masses lymphadenopathy or skin changes noted in that right hip region.  No discrete trochanteric tenderness no paresthesias L1 S1 bilaterally.  Specialty Comments:  No specialty comments available.  Imaging: No results found.   PMFS History: Patient Active Problem List   Diagnosis  Date Noted  . Elevated coronary artery calcium score 12/23/2017  . Hypercholesterolemia 12/23/2017  . Mild dilation of ascending aorta (HCC) 12/23/2017  . Diverticulitis 04/11/2017  . Prostate cancer ([place]) 11/01/2015  . Hypothyroidism 11/01/2015  . SVT (supraventricular tachycardia) ([place]) 08/19/2013  . Actinic keratoses 10/31/2011  . Fatigue 10/31/2011  . Well adult exam 10/30/2011  . Chest pain, unspecified 01/01/2011  . Phlebitis and thrombophlebitis of superficial veins of upper extremities 12/20/2010  . Anemia due to blood loss, acute 12/11/2010  . Colon polyp 12/11/2010  . PALPITATIONS, OCCASIONAL 01/11/2009  . HERPES ZOSTER 08/13/2008  . LOW BACK PAIN 08/13/2008  . LEG PAIN, RIGHT 08/13/2008  . CBC, ABNORMAL 04/01/2007   Past Medical History:  Diagnosis Date  . Eczema    hot tub related  . GI bleed    s/p polypectomy  . Herpes zoster 2010  . Prostate cancer ([place])   . SVT (supraventricular tachycardia) (HCC)     Family History  Problem Relation Age of Onset  . Heart disease Mother        pacemaker  . Cancer Mother        lung/smoker/passed at age 51  . Heart attack Father   . Cancer Paternal Uncle        pancreatic    Past Surgical History:  Procedure Laterality Date  . APPENDECTOMY  05/2010  . CYSTOSCOPY N/A 08/30/2016   Procedure: CYSTOSCOPY FLEXIBLE;  Surgeon: Irine Seal, MD;  Location: [facility];  Service: Urology;  Laterality: N/A;  no seeds found in bladder  . ELECTROPHYSIOLOGY STUDY  08/24/13   EPS by Dr Lovena Le with no inducible SVT or evidence of dual AV nodal physiology  . PROSTATE BIOPSY    . RADIOACTIVE SEED IMPLANT N/A 08/30/2016   Procedure: RADIOACTIVE SEED IMPLANT/BRACHYTHERAPY IMPLANT WITH SPACE OAR;  Surgeon: Irine Seal, MD;  Location: [facility];  Service: Urology;  Laterality: N/A;    62   seeds implanted  . ROTATOR CUFF REPAIR  2003   right  . SUPRAVENTRICULAR TACHYCARDIA ABLATION N/A 08/24/2013   Procedure:  SUPRAVENTRICULAR TACHYCARDIA ABLATION;  Surgeon: Evans Lance, MD;  Location: [facility] CATH LAB;  Service: Cardiovascular;  Laterality: N/A;   Social History   Occupational History  . Not on file  Tobacco Use  . Smoking status: Passive Smoke Exposure - Never Smoker  . Smokeless tobacco: Never Used  . Tobacco comment: occasional  Substance and Sexual Activity  . Alcohol use: Yes  . Drug use: No  . Sexual activity: Yes

## 2018-04-29 DIAGNOSIS — H2513 Age-related nuclear cataract, bilateral: Secondary | ICD-10-CM | POA: Diagnosis not present

## 2018-05-02 ENCOUNTER — Other Ambulatory Visit: Payer: Self-pay | Admitting: Internal Medicine

## 2018-05-20 ENCOUNTER — Ambulatory Visit: Payer: Medicare Other | Admitting: Internal Medicine

## 2018-06-26 DIAGNOSIS — C61 Malignant neoplasm of prostate: Secondary | ICD-10-CM | POA: Diagnosis not present

## 2018-07-02 DIAGNOSIS — R351 Nocturia: Secondary | ICD-10-CM | POA: Diagnosis not present

## 2018-07-02 DIAGNOSIS — N401 Enlarged prostate with lower urinary tract symptoms: Secondary | ICD-10-CM | POA: Diagnosis not present

## 2018-07-02 DIAGNOSIS — Z8546 Personal history of malignant neoplasm of prostate: Secondary | ICD-10-CM | POA: Diagnosis not present

## 2018-07-14 ENCOUNTER — Ambulatory Visit (INDEPENDENT_AMBULATORY_CARE_PROVIDER_SITE_OTHER): Payer: Medicare Other | Admitting: Internal Medicine

## 2018-07-14 ENCOUNTER — Other Ambulatory Visit (INDEPENDENT_AMBULATORY_CARE_PROVIDER_SITE_OTHER): Payer: Medicare Other

## 2018-07-14 ENCOUNTER — Other Ambulatory Visit: Payer: Self-pay

## 2018-07-14 ENCOUNTER — Encounter: Payer: Self-pay | Admitting: Internal Medicine

## 2018-07-14 DIAGNOSIS — E039 Hypothyroidism, unspecified: Secondary | ICD-10-CM

## 2018-07-14 DIAGNOSIS — E7800 Pure hypercholesterolemia, unspecified: Secondary | ICD-10-CM

## 2018-07-14 DIAGNOSIS — I471 Supraventricular tachycardia: Secondary | ICD-10-CM

## 2018-07-14 DIAGNOSIS — C61 Malignant neoplasm of prostate: Secondary | ICD-10-CM | POA: Diagnosis not present

## 2018-07-14 DIAGNOSIS — K635 Polyp of colon: Secondary | ICD-10-CM

## 2018-07-14 LAB — BASIC METABOLIC PANEL
BUN: 13 mg/dL (ref 6–23)
CO2: 24 mEq/L (ref 19–32)
Calcium: 9.1 mg/dL (ref 8.4–10.5)
Chloride: 103 mEq/L (ref 96–112)
Creatinine, Ser: 0.98 mg/dL (ref 0.40–1.50)
GFR: 75.98 mL/min (ref >60.00)
Glucose, Bld: 96 mg/dL (ref 70–99)
Potassium: 4.2 mEq/L (ref 3.5–5.1)
Sodium: 137 mEq/L (ref 135–145)

## 2018-07-14 LAB — LIPID PANEL
Cholesterol: 190 mg/dL (ref 0–200)
HDL: 48.7 mg/dL (ref >39.00)
LDL Cholesterol: 110 mg/dL — ABNORMAL HIGH (ref 0–99)
NonHDL: 141.74
Total CHOL/HDL Ratio: 4
Triglycerides: 157 mg/dL — ABNORMAL HIGH (ref 0.0–149.0)
VLDL: 31.4 mg/dL (ref 0.0–40.0)

## 2018-07-14 LAB — CBC WITH DIFFERENTIAL/PLATELET
Basophils Absolute: 0 10*3/uL (ref 0.0–0.1)
Basophils Relative: 0.8 % (ref 0.0–3.0)
Eosinophils Absolute: 0.1 10*3/uL (ref 0.0–0.7)
Eosinophils Relative: 2.3 % (ref 0.0–5.0)
HCT: 44.2 % (ref 39.0–52.0)
Hemoglobin: 15.2 g/dL (ref 13.0–17.0)
Lymphocytes Relative: 44.6 % (ref 12.0–46.0)
Lymphs Abs: 2.5 10*3/uL (ref 0.7–4.0)
MCHC: 34.4 g/dL (ref 30.0–36.0)
MCV: 92.6 fl (ref 78.0–100.0)
Monocytes Absolute: 0.6 10*3/uL (ref 0.1–1.0)
Monocytes Relative: 11.5 % (ref 3.0–12.0)
Neutro Abs: 2.3 10*3/uL (ref 1.4–7.7)
Neutrophils Relative %: 40.8 % — ABNORMAL LOW (ref 43.0–77.0)
Platelets: 176 10*3/uL (ref 150.0–400.0)
RBC: 4.77 Mil/uL (ref 4.22–5.81)
RDW: 13.8 % (ref 11.5–15.5)
WBC: 5.6 10*3/uL (ref 4.0–10.5)

## 2018-07-14 LAB — HEPATIC FUNCTION PANEL
ALT: 10 U/L (ref 0–53)
AST: 18 U/L (ref 0–37)
Albumin: 4.4 g/dL (ref 3.5–5.2)
Alkaline Phosphatase: 44 U/L (ref 39–117)
Bilirubin, Direct: 0.1 mg/dL (ref 0.0–0.3)
Total Bilirubin: 0.6 mg/dL (ref 0.2–1.2)
Total Protein: 6.9 g/dL (ref 6.0–8.3)

## 2018-07-14 LAB — TSH: TSH: 3 u[IU]/mL (ref 0.35–4.50)

## 2018-07-14 NOTE — Assessment & Plan Note (Signed)
F/u w/Dr Gessner 

## 2018-07-14 NOTE — Assessment & Plan Note (Signed)
Levothyroxine

## 2018-07-14 NOTE — Assessment & Plan Note (Signed)
PSA is down

## 2018-07-14 NOTE — Assessment & Plan Note (Signed)
No relapse 

## 2018-07-14 NOTE — Progress Notes (Signed)
Subjective:  Patient ID: Jacob [name], male    DOB: 1950/03/22  Age: 68 y.o. MRN: [id number]  CC: No chief complaint on file.   HPI Croy Drumwright presents for prostate ca (PSA 0.165), hypothyroidism, colon polyps f/u  Outpatient Medications Prior to Visit  Medication Sig Dispense Refill   amoxicillin (AMOXIL) 500 MG tablet TK 4 TS PO 1 HOUR PRIOR TO TREATMENT  0   cholecalciferol (VITAMIN D) 1000 UNITS tablet Take 1,000 Units by mouth every evening.      glucosamine-chondroitin 500-400 MG tablet Take 2 tablets by mouth every evening.     levothyroxine (SYNTHROID, LEVOTHROID) 50 MCG tablet TAKE 1 TABLET BY MOUTH EVERY DAY 90 tablet 3   Multiple Vitamins-Minerals (MULTIVITAL PO) Take 1 tablet by mouth every evening.      Omega-3 Fatty Acids (FISH OIL) 1000 MG CAPS Take 2,000 mg by mouth every evening.      OVER THE COUNTER MEDICATION Take 500 mg by mouth every evening. Lysin tab once daily     No facility-administered medications prior to visit.     ROS: Review of Systems  Constitutional: Negative for appetite change, fatigue and unexpected weight change.  HENT: Negative for congestion, nosebleeds, sneezing, sore throat and trouble swallowing.   Eyes: Negative for itching and visual disturbance.  Respiratory: Negative for cough.   Cardiovascular: Negative for chest pain, palpitations and leg swelling.  Gastrointestinal: Negative for abdominal distention, blood in stool, diarrhea and nausea.  Genitourinary: Negative for frequency and hematuria.  Musculoskeletal: Negative for back pain, gait problem, joint swelling and neck pain.  Skin: Negative for rash.  Neurological: Negative for dizziness, tremors, speech difficulty and weakness.  Psychiatric/Behavioral: Negative for agitation, dysphoric mood, sleep disturbance and suicidal ideas. The patient is not nervous/anxious.     Objective:  BP 128/70 (BP Location: Left Arm, Patient Position: Sitting, Cuff Size: Large)     Pulse 61    Temp 98.6 F (37 C) (Oral)    Ht 5\' 11"  (1.803 m)    Wt 197 lb (89.4 kg)    SpO2 97%    BMI 27.48 kg/m   BP Readings from Last 3 Encounters:  07/14/18 128/70  12/30/17 135/84  12/23/17 126/80    Wt Readings from Last 3 Encounters:  07/14/18 197 lb (89.4 kg)  12/30/17 195 lb (88.5 kg)  12/23/17 204 lb (92.5 kg)    Physical Exam Constitutional:      General: He is not in acute distress.    Appearance: He is well-developed.     Comments: NAD  Eyes:     Conjunctiva/sclera: Conjunctivae normal.     Pupils: Pupils are equal, round, and reactive to light.  Neck:     Musculoskeletal: Normal range of motion.     Thyroid: No thyromegaly.     Vascular: No JVD.  Cardiovascular:     Rate and Rhythm: Normal rate and regular rhythm.     Heart sounds: Normal heart sounds. No murmur. No friction rub. No gallop.   Pulmonary:     Effort: Pulmonary effort is normal. No respiratory distress.     Breath sounds: Normal breath sounds. No wheezing or rales.  Chest:     Chest wall: No tenderness.  Abdominal:     General: Bowel sounds are normal. There is no distension.     Palpations: Abdomen is soft. There is no mass.     Tenderness: There is no abdominal tenderness. There is no guarding or rebound.  Musculoskeletal: Normal  range of motion.        General: No tenderness.  Lymphadenopathy:     Cervical: No cervical adenopathy.  Skin:    General: Skin is warm and dry.     Findings: No rash.  Neurological:     Mental Status: He is alert and oriented to person, place, and time.     Cranial Nerves: No cranial nerve deficit.     Motor: No abnormal muscle tone.     Coordination: Coordination normal.     Gait: Gait normal.     Deep Tendon Reflexes: Reflexes are normal and symmetric.  Psychiatric:        Behavior: Behavior normal.        Thought Content: Thought content normal.        Judgment: Judgment normal.     Lab Results  Component Value Date   WBC 9.0 04/11/2017    HGB 15.7 04/11/2017   HCT 46.0 04/11/2017   PLT 200.0 04/11/2017   GLUCOSE 87 11/13/2017   CHOL 184 12/28/2016   TRIG 156.0 (H) 12/28/2016   HDL 50.40 12/28/2016   LDLDIRECT 126.2 10/24/2011   LDLCALC 103 (H) 12/28/2016   ALT 11 12/28/2016   AST 17 12/28/2016   NA 137 11/13/2017   K 3.9 11/13/2017   CL 104 11/13/2017   CREATININE 1.03 11/13/2017   BUN 17 11/13/2017   CO2 26 11/13/2017   TSH 3.05 11/13/2017   PSA 4.45 (H) 10/31/2015   INR 0.95 08/24/2016   HGBA1C 5.6 04/29/2013    Dl Fluoro Guided Needle Plc Aspiration / Injecttion/loc  Result Date: 01/14/2018 CLINICAL DATA:  Post traumatic arthritis, pain. EXAM: RIGHT HIP INJECTION UNDER FLUOROSCOPY FLUOROSCOPY TIME:  5 seconds; 8 uGym2 DAP TECHNIQUE: The procedure, risks (including but not limited to bleeding, infection, organ damage ), benefits, and alternatives were explained to the patient. Questions regarding the procedure were encouraged and answered. The patient understands and consents to the procedure. An appropriate skin entry site was determined under fluoroscopy. Site was marked, prepped with Betadine, draped in usual sterile fashion, infiltrated locally with 1% lidocaine. A 22 gauge spinal needle was advanced to the lateral margin of the femoral head. 1 ml lidocaine 1% injected easily. Contrast injection of 2 ml Omnipaque-300 showed intraarticular spread without any intravascular component. 120 mg Depo-Medrol and 5 ml lidocaine 1% was administered. The patient tolerated procedure well. COMPLICATIONS: None immediate IMPRESSION: 1. Technically successful  right hip injection under fluoroscopy Electronically Signed   By: Lucrezia Europe M.D.   On: 01/14/2018 11:21    Assessment & Plan:   There are no diagnoses linked to this encounter.   No orders of the defined types were placed in this encounter.    Follow-up: No follow-ups on file.  Walker Kehr, MD

## 2018-07-14 NOTE — Assessment & Plan Note (Signed)
statins discussed

## 2018-08-13 ENCOUNTER — Telehealth: Payer: Self-pay

## 2018-08-13 NOTE — Telephone Encounter (Signed)
Covid-19 screening questions   Do you now or have you had a fever in the last 14 days no   Do you have any respiratory symptoms of shortness of breath or cough now or in the last 14 days no  Do you have any family members or close contacts with diagnosed or suspected Covid-19 in the past 14 days no  Have you been tested for Covid-19 and found to be positive no          

## 2018-08-14 ENCOUNTER — Ambulatory Visit (INDEPENDENT_AMBULATORY_CARE_PROVIDER_SITE_OTHER): Payer: Medicare Other | Admitting: Internal Medicine

## 2018-08-14 ENCOUNTER — Encounter: Payer: Self-pay | Admitting: Internal Medicine

## 2018-08-14 VITALS — HR 69 | Temp 97.9°F | Ht 71.0 in | Wt 199.0 lb

## 2018-08-14 DIAGNOSIS — Z86010 Personal history of colonic polyps: Secondary | ICD-10-CM

## 2018-08-14 NOTE — Progress Notes (Signed)
Jacob [name] 68 y.o. 1951/01/27 [id number]  Assessment & Plan:   Encounter Diagnosis  Name Primary?  Marland Kitchen Hx of colonic polyps Yes    Surveillance colonoscopy is appropriate.  The risks and benefits as well as alternatives of endoscopic procedure(s) have been discussed and reviewed. All questions answered. The patient agrees to proceed.    Subjective:   Chief Complaint: History of colon polyp  HPI The patient is a 68 year old white man with a history of colon polyp, a serrated adenoma removed with [facility], without follow-up colonoscopy since then.  He suffered post polypectomy bleeding after the October 2012 colonoscopy, which I treated with clipping.  In the interim he has had radioactive seeds for prostate cancer last year coordinated by Dr. Jeffie Pollock.  He denies any active GI problems.  He is planning a trip to the [place] soon to visit with grandchildren and children and to fish on his new boat after he recently retired.  He would like to schedule a colonoscopy after that. Allergies  Allergen Reactions  . Pneumovax [Pneumococcal Polysaccharide Vaccine]     Redness, swelling   Current Meds  Medication Sig  . amoxicillin (AMOXIL) 500 MG tablet TK 4 TS PO 1 HOUR PRIOR TO TREATMENT  . cholecalciferol (VITAMIN D) 1000 UNITS tablet Take 1,000 Units by mouth every evening.   Marland Kitchen glucosamine-chondroitin 500-400 MG tablet Take 2 tablets by mouth every evening.  Marland Kitchen levothyroxine (SYNTHROID, LEVOTHROID) 50 MCG tablet TAKE 1 TABLET BY MOUTH EVERY DAY  . Multiple Vitamins-Minerals (MULTIVITAL PO) Take 1 tablet by mouth every evening.   . Omega-3 Fatty Acids (FISH OIL) 1000 MG CAPS Take 2,000 mg by mouth every evening.   Marland Kitchen OVER THE COUNTER MEDICATION Take 500 mg by mouth every evening. Lysin tab once daily   Past Medical History:  Diagnosis Date  . Eczema    hot tub related  . GI bleed    s/p polypectomy  . Herpes zoster 2010  . Prostate  cancer ([place])   . SVT (supraventricular tachycardia) ([facility])    Past Surgical History:  Procedure Laterality Date  . APPENDECTOMY  05/2010  . COLONOSCOPY W/ BIOPSIES    . CYSTOSCOPY N/A 08/30/2016   Procedure: CYSTOSCOPY FLEXIBLE;  Surgeon: Irine Seal, MD;  Location: [facility];  Service: Urology;  Laterality: N/A;  no seeds found in bladder  . ELECTROPHYSIOLOGY STUDY  08/24/13   EPS by Dr Lovena Le with no inducible SVT or evidence of dual AV nodal physiology  . PROSTATE BIOPSY    . RADIOACTIVE SEED IMPLANT N/A 08/30/2016   Procedure: RADIOACTIVE SEED IMPLANT/BRACHYTHERAPY IMPLANT WITH SPACE OAR;  Surgeon: Irine Seal, MD;  Location: [facility];  Service: Urology;  Laterality: N/A;    62   seeds implanted  . ROTATOR CUFF REPAIR  2003   right  . 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   Review of Systems As per HPI  Objective:   Physical Exam Pulse 69   Temp 97.9 F (36.6 C)  Ht 5\' 11"  (1.803 m)   Wt 199 lb (90.3 kg)   BMI 27.75 kg/m  No acute distress Appropriate mood and affect Alert and oriented  20 mins time > half counseling/coord care

## 2018-08-14 NOTE — Patient Instructions (Signed)
You have been scheduled for a colonoscopy. Please follow written instructions given to you at your visit today.  Please pick up your prep supplies at the pharmacy within the next 1-3 days. If you use inhalers (even only as needed), please bring them with you on the day of your procedure.   Have a great time in the [place].    I appreciate the opportunity to care for you. Silvano Rusk, MD, [facility]

## 2018-09-11 DIAGNOSIS — Z85828 Personal history of other malignant neoplasm of skin: Secondary | ICD-10-CM | POA: Diagnosis not present

## 2018-09-11 DIAGNOSIS — L820 Inflamed seborrheic keratosis: Secondary | ICD-10-CM | POA: Diagnosis not present

## 2018-09-16 ENCOUNTER — Telehealth: Payer: Self-pay | Admitting: Internal Medicine

## 2018-09-16 NOTE — Telephone Encounter (Signed)
Pt responded "no" to all COVID screening questions

## 2018-09-16 NOTE — Telephone Encounter (Signed)

## 2018-09-17 ENCOUNTER — Other Ambulatory Visit: Payer: Self-pay

## 2018-09-17 ENCOUNTER — Encounter: Payer: Self-pay | Admitting: Internal Medicine

## 2018-09-17 ENCOUNTER — Ambulatory Visit (AMBULATORY_SURGERY_CENTER): Payer: Medicare Other | Admitting: Internal Medicine

## 2018-09-17 VITALS — BP 130/67 | HR 45 | Temp 98.3°F | Resp 9 | Ht 71.0 in | Wt 199.0 lb

## 2018-09-17 DIAGNOSIS — Z86010 Personal history of colonic polyps: Secondary | ICD-10-CM | POA: Diagnosis not present

## 2018-09-17 DIAGNOSIS — D123 Benign neoplasm of transverse colon: Secondary | ICD-10-CM | POA: Diagnosis not present

## 2018-09-17 MED ORDER — SODIUM CHLORIDE 0.9 % IV SOLN: 500.0000 mL | Freq: Once | INTRAVENOUS | Status: DC

## 2018-09-17 NOTE — Progress Notes (Signed)
[place]

## 2018-09-17 NOTE — Patient Instructions (Addendum)
;  I found and removed one tiny polyp. I will let you know pathology results and when to have another routine colonoscopy by mail and/or My Chart.  I appreciate the opportunity to care for you. Gatha Mayer, MD, FACG    YOU HAD AN ENDOSCOPIC PROCEDURE TODAY AT [facility]:   Refer to the procedure report that was given to you for any specific questions about what was found during the examination.  If the procedure report does not answer your questions, please call your gastroenterologist to clarify.  If you requested that your care partner not be given the details of your procedure findings, then the procedure report has been included in a sealed envelope for you to review at your convenience later.  YOU SHOULD EXPECT: Some feelings of bloating in the abdomen. Passage of more gas than usual.  Walking can help get rid of the air that was put into your GI tract during the procedure and reduce the bloating. If you had a lower endoscopy (such as a colonoscopy or flexible sigmoidoscopy) you may notice spotting of blood in your stool or on the toilet paper. If you underwent a bowel prep for your procedure, you may not have a normal bowel movement for a few days.  Please Note:  You might notice some irritation and congestion in your nose or some drainage.  This is from the oxygen used during your procedure.  There is no need for concern and it should clear up in a day or so.  SYMPTOMS TO REPORT IMMEDIATELY:   Following lower endoscopy (colonoscopy or flexible sigmoidoscopy):  Excessive amounts of blood in the stool  Significant tenderness or worsening of abdominal pains  Swelling of the abdomen that is new, acute  Fever of 100F or higher   For urgent or emergent issues, a gastroenterologist can be reached at any hour by calling [phone].   DIET:  We do recommend a small meal at first, but then you may proceed to your regular diet.  Drink plenty of fluids but you  should avoid alcoholic beverages for 24 hours.  ACTIVITY:  You should plan to take it easy for the rest of today and you should NOT DRIVE or use heavy machinery until tomorrow (because of the sedation medicines used during the test).    FOLLOW UP: Our staff will call the number listed on your records 48-72 hours following your procedure to check on you and address any questions or concerns that you may have regarding the information given to you following your procedure. If we do not reach you, we will leave a message.  We will attempt to reach you two times.  During this call, we will ask if you have developed any symptoms of COVID 19. If you develop any symptoms (ie: fever, flu-like symptoms, shortness of breath, cough etc.) before then, please call [phone].  If you test positive for Covid 19 in the 2 weeks post procedure, please call and report this information to [place].    If any biopsies were taken you will be contacted by phone or by letter within the next 1-3 weeks.  Please call us at [phone] if you have not heard about the biopsies in 3 weeks.    SIGNATURES/CONFIDENTIALITY: You and/or your care partner have signed paperwork which will be entered into your electronic medical record.  These signatures attest to the fact that that the information above on your After Visit Summary has been reviewed and  is understood.  Full responsibility of the confidentiality of this discharge information lies with you and/or your care-partner.

## 2018-09-17 NOTE — Op Note (Signed)
[place] Patient Name: Jacob [name] Procedure Date: 09/17/2018 9:43 AM MRN: [id number] Endoscopist: Gatha Mayer , MD Age: 68 Referring MD:  Date of Birth: April 10, 1950 Gender: Male Account #: [id number] Procedure:                Colonoscopy Indications:              High risk colon cancer surveillance: Personal                            history of sessile serrated colon polyp (10 mm or                            greater in size), Last colonoscopy: 2012 Medicines:                Propofol per Anesthesia, Monitored Anesthesia Care Procedure:                Pre-Anesthesia Assessment:                           - Prior to the procedure, a History and Physical                            was performed, and patient medications and                            allergies were reviewed. The patient's tolerance of                            previous anesthesia was also reviewed. The risks                            and benefits of the procedure and the sedation                            options and risks were discussed with the patient.                            All questions were answered, and informed consent                            was obtained. Prior Anticoagulants: The patient has                            taken no previous anticoagulant or antiplatelet                            agents. ASA Grade Assessment: II - A patient with                            mild systemic disease. After reviewing the risks                            and benefits, the patient was deemed in  satisfactory condition to undergo the procedure.                           After obtaining informed consent, the colonoscope                            was passed under direct vision. Throughout the                            procedure, the patient's blood pressure, pulse, and                            oxygen saturations were monitored continuously. The      Colonoscope was introduced through the anus and                            advanced to the the cecum, identified by                            appendiceal orifice and ileocecal valve. The                            colonoscopy was performed without difficulty. The                            patient tolerated the procedure well. The quality                            of the bowel preparation was excellent. The bowel                            preparation used was Miralax via split dose                            instruction. The ileocecal valve, appendiceal                            orifice, and rectum were photographed. Scope In: 9:51:12 AM Scope Out: 10:08:55 AM Scope Withdrawal Time: 0 hours 12 minutes 58 seconds  Total Procedure Duration: 0 hours 17 minutes 43 seconds  Findings:                 The perianal and digital rectal examinations were                            normal.                           A diminutive polyp was found in the transverse                            colon. The polyp was sessile. The polyp was removed                            with a cold snare. Resection  and retrieval were                            complete. Verification of patient identification                            for the specimen was done. Estimated blood loss was                            minimal.                           Multiple diverticula were found in the sigmoid                            colon.                           The exam was otherwise without abnormality on                            direct and retroflexion views. Complications:            No immediate complications. Estimated Blood Loss:     Estimated blood loss was minimal. Impression:               - One diminutive polyp in the transverse colon,                            removed with a cold snare. Resected and retrieved.                           - Diverticulosis in the sigmoid colon.                           - The  examination was otherwise normal on direct                            and retroflexion views.                           - Personal history of colonic polyp serrated                            adenoma removed over 2 colonoscopies 2012. Recommendation:           - Patient has a contact number available for                            emergencies. The signs and symptoms of potential                            delayed complications were discussed with the                            patient. Return to normal activities tomorrow.  Written discharge instructions were provided to the                            patient.                           - Resume previous diet.                           - Continue present medications.                           - Repeat colonoscopy is recommended for                            surveillance. The colonoscopy date will be                            determined after pathology results from today's                            exam become available for review. Gatha Mayer, MD 09/17/2018 10:17:26 AM This report has been signed electronically.

## 2018-09-19 ENCOUNTER — Telehealth: Payer: Self-pay

## 2018-09-19 NOTE — Telephone Encounter (Signed)
Left voice mail

## 2018-09-19 NOTE — Telephone Encounter (Signed)
  Follow up Call-  Call back number 09/17/2018  Post procedure Call Back phone  # [phone]  Permission to leave phone message Yes  Some recent data might be hidden     Patient questions:  Do you have a fever, pain , or abdominal swelling? No. Pain Score  0 *  Have you tolerated food without any problems? Yes.    Have you been able to return to your normal activities? Yes.    Do you have any questions about your discharge instructions: Diet   No. Medications  No. Follow up visit  No.  Do you have questions or concerns about your Care? No.  Actions: * If pain score is 4 or above: No action needed, pain <4. 1. Have you developed a fever since your procedure? no  2.   Have you had an respiratory symptoms (SOB or cough) since your procedure? no  3.   Have you tested positive for COVID 19 since your procedure no  4.   Have you had any family members/close contacts diagnosed with the COVID 19 since your procedure?  no   If yes to any of these questions please route to Joylene John, RN and Alphonsa Gin, Therapist, sports.

## 2018-09-21 ENCOUNTER — Encounter: Payer: Self-pay | Admitting: Internal Medicine

## 2018-09-21 DIAGNOSIS — Z86010 Personal history of colonic polyps: Secondary | ICD-10-CM

## 2018-09-21 NOTE — Progress Notes (Signed)
Tubular adenoma - diminutive, large ssp removed 2012 Recall 2025

## 2018-12-04 DIAGNOSIS — Z23 Encounter for immunization: Secondary | ICD-10-CM | POA: Diagnosis not present

## 2018-12-17 DIAGNOSIS — D1801 Hemangioma of skin and subcutaneous tissue: Secondary | ICD-10-CM | POA: Diagnosis not present

## 2018-12-17 DIAGNOSIS — D2262 Melanocytic nevi of left upper limb, including shoulder: Secondary | ICD-10-CM | POA: Diagnosis not present

## 2018-12-17 DIAGNOSIS — L570 Actinic keratosis: Secondary | ICD-10-CM | POA: Diagnosis not present

## 2018-12-17 DIAGNOSIS — D2371 Other benign neoplasm of skin of right lower limb, including hip: Secondary | ICD-10-CM | POA: Diagnosis not present

## 2018-12-17 DIAGNOSIS — Z85828 Personal history of other malignant neoplasm of skin: Secondary | ICD-10-CM | POA: Diagnosis not present

## 2018-12-17 DIAGNOSIS — L821 Other seborrheic keratosis: Secondary | ICD-10-CM | POA: Diagnosis not present

## 2018-12-17 DIAGNOSIS — D225 Melanocytic nevi of trunk: Secondary | ICD-10-CM | POA: Diagnosis not present

## 2018-12-17 DIAGNOSIS — D2261 Melanocytic nevi of right upper limb, including shoulder: Secondary | ICD-10-CM | POA: Diagnosis not present

## 2018-12-17 DIAGNOSIS — L814 Other melanin hyperpigmentation: Secondary | ICD-10-CM | POA: Diagnosis not present

## 2019-01-13 ENCOUNTER — Ambulatory Visit: Payer: Medicare Other | Admitting: Internal Medicine

## 2019-01-14 DIAGNOSIS — Z8546 Personal history of malignant neoplasm of prostate: Secondary | ICD-10-CM | POA: Diagnosis not present

## 2019-01-15 ENCOUNTER — Ambulatory Visit (INDEPENDENT_AMBULATORY_CARE_PROVIDER_SITE_OTHER): Payer: Medicare Other | Admitting: Internal Medicine

## 2019-01-15 ENCOUNTER — Encounter: Payer: Self-pay | Admitting: Internal Medicine

## 2019-01-15 ENCOUNTER — Other Ambulatory Visit: Payer: Self-pay

## 2019-01-15 DIAGNOSIS — I712 Thoracic aortic aneurysm, without rupture: Secondary | ICD-10-CM

## 2019-01-15 DIAGNOSIS — I7121 Aneurysm of the ascending aorta, without rupture: Secondary | ICD-10-CM

## 2019-01-15 DIAGNOSIS — I2510 Atherosclerotic heart disease of native coronary artery without angina pectoris: Secondary | ICD-10-CM | POA: Insufficient documentation

## 2019-01-15 DIAGNOSIS — C61 Malignant neoplasm of prostate: Secondary | ICD-10-CM | POA: Diagnosis not present

## 2019-01-15 DIAGNOSIS — I2583 Coronary atherosclerosis due to lipid rich plaque: Secondary | ICD-10-CM | POA: Diagnosis not present

## 2019-01-15 NOTE — Assessment & Plan Note (Addendum)
4 cm on CT Could consider a repeat evaluation with a contrast CT angiogram in 3-5 years.

## 2019-01-15 NOTE — Assessment & Plan Note (Signed)
PSA F/u w/Dr Jeffie Pollock

## 2019-01-15 NOTE — Patient Instructions (Signed)
If you have medicare related insurance (such as traditional Medicare, Blue Cross Medicare, United HealthCare Medicare, or similar), Please make an appointment at the scheduling desk with Jill, the Wellness Health Coach, for your Wellness visit in this office, which is a benefit with your insurance.  

## 2019-01-15 NOTE — Assessment & Plan Note (Signed)
Coronary calcium score of 188. Cardiol ref  Statin discussed

## 2019-01-15 NOTE — Progress Notes (Signed)
Subjective:  Patient ID: Jacob [name], male    DOB: 1950/07/18  Age: 68 y.o. MRN: [id number]  CC: No chief complaint on file.   HPI Jacob [name] presents for CAD, prostate cancer, hypothyroidism, colon polyps f/u   Outpatient Medications Prior to Visit  Medication Sig Dispense Refill  . amoxicillin (AMOXIL) 500 MG tablet TK 4 TS PO 1 HOUR PRIOR TO TREATMENT  0  . cholecalciferol (VITAMIN D) 1000 UNITS tablet Take 1,000 Units by mouth every evening.     Marland Kitchen glucosamine-chondroitin 500-400 MG tablet Take 2 tablets by mouth every evening.    Marland Kitchen KRILL OIL PO One daily    . levothyroxine (SYNTHROID, LEVOTHROID) 50 MCG tablet TAKE 1 TABLET BY MOUTH EVERY DAY 90 tablet 3  . Multiple Vitamins-Minerals (MULTIVITAL PO) Take 1 tablet by mouth every evening.     Marland Kitchen OVER THE COUNTER MEDICATION Take 500 mg by mouth every evening. Lysin tab once daily     No facility-administered medications prior to visit.     ROS: Review of Systems  Constitutional: Negative for appetite change, fatigue and unexpected weight change.  HENT: Negative for congestion, nosebleeds, sneezing, sore throat and trouble swallowing.   Eyes: Negative for itching and visual disturbance.  Respiratory: Negative for cough.   Cardiovascular: Negative for chest pain, palpitations and leg swelling.  Gastrointestinal: Negative for abdominal distention, blood in stool, diarrhea and nausea.  Genitourinary: Negative for frequency and hematuria.  Musculoskeletal: Negative for back pain, gait problem, joint swelling and neck pain.  Skin: Negative for rash.  Neurological: Negative for dizziness, tremors, speech difficulty and weakness.  Psychiatric/Behavioral: Negative for agitation, dysphoric mood and sleep disturbance. The patient is not nervous/anxious.     Objective:  BP 130/82 (BP Location: Left Arm, Patient Position: Sitting, Cuff Size: Large)   Pulse 62   Temp 98.2 F (36.8 C) (Oral)   Ht 5\' 11"  (1.803 m)   Wt 199  lb (90.3 kg)   SpO2 98%   BMI 27.75 kg/m   BP Readings from Last 3 Encounters:  01/15/19 130/82  09/17/18 130/67  07/14/18 128/70    Wt Readings from Last 3 Encounters:  01/15/19 199 lb (90.3 kg)  09/17/18 199 lb (90.3 kg)  08/14/18 199 lb (90.3 kg)    Physical Exam Constitutional:      General: He is not in acute distress.    Appearance: He is well-developed.     Comments: NAD  Eyes:     Conjunctiva/sclera: Conjunctivae normal.     Pupils: Pupils are equal, round, and reactive to light.  Neck:     Musculoskeletal: Normal range of motion.     Thyroid: No thyromegaly.     Vascular: No JVD.  Cardiovascular:     Rate and Rhythm: Normal rate and regular rhythm.     Heart sounds: Normal heart sounds. No murmur. No friction rub. No gallop.   Pulmonary:     Effort: Pulmonary effort is normal. No respiratory distress.     Breath sounds: Normal breath sounds. No wheezing or rales.  Chest:     Chest wall: No tenderness.  Abdominal:     General: Bowel sounds are normal. There is no distension.     Palpations: Abdomen is soft. There is no mass.     Tenderness: There is no abdominal tenderness. There is no guarding or rebound.  Musculoskeletal: Normal range of motion.        General: No tenderness.  Lymphadenopathy:  Cervical: No cervical adenopathy.  Skin:    General: Skin is warm and dry.     Findings: No rash.  Neurological:     Mental Status: He is alert and oriented to person, place, and time.     Cranial Nerves: No cranial nerve deficit.     Motor: No abnormal muscle tone.     Coordination: Coordination normal.     Gait: Gait normal.     Deep Tendon Reflexes: Reflexes are normal and symmetric.  Psychiatric:        Behavior: Behavior normal.        Thought Content: Thought content normal.        Judgment: Judgment normal.     Lab Results  Component Value Date   WBC 5.6 07/14/2018   HGB 15.2 07/14/2018   HCT 44.2 07/14/2018   PLT 176.0 07/14/2018    GLUCOSE 96 07/14/2018   CHOL 190 07/14/2018   TRIG 157.0 (H) 07/14/2018   HDL 48.70 07/14/2018   LDLDIRECT 126.2 10/24/2011   LDLCALC 110 (H) 07/14/2018   ALT 10 07/14/2018   AST 18 07/14/2018   NA 137 07/14/2018   K 4.2 07/14/2018   CL 103 07/14/2018   CREATININE 0.98 07/14/2018   BUN 13 07/14/2018   CO2 24 07/14/2018   TSH 3.00 07/14/2018   PSA 4.45 (H) 10/31/2015   INR 0.95 08/24/2016   HGBA1C 5.6 04/29/2013    Dl Fluoro Guided Needle Plc Aspiration / Injecttion/loc  Result Date: 01/14/2018 CLINICAL DATA:  Post traumatic arthritis, pain. EXAM: RIGHT HIP INJECTION UNDER FLUOROSCOPY FLUOROSCOPY TIME:  5 seconds; 8 uGym2 DAP TECHNIQUE: The procedure, risks (including but not limited to bleeding, infection, organ damage ), benefits, and alternatives were explained to the patient. Questions regarding the procedure were encouraged and answered. The patient understands and consents to the procedure. An appropriate skin entry site was determined under fluoroscopy. Site was marked, prepped with Betadine, draped in usual sterile fashion, infiltrated locally with 1% lidocaine. A 22 gauge spinal needle was advanced to the lateral margin of the femoral head. 1 ml lidocaine 1% injected easily. Contrast injection of 2 ml Omnipaque-300 showed intraarticular spread without any intravascular component. 120 mg Depo-Medrol and 5 ml lidocaine 1% was administered. The patient tolerated procedure well. COMPLICATIONS: None immediate IMPRESSION: 1. Technically successful  right hip injection under fluoroscopy Electronically Signed   By: Lucrezia Europe M.D.   On: 01/14/2018 11:21    Assessment & Plan:   There are no diagnoses linked to this encounter.   No orders of the defined types were placed in this encounter.    Follow-up: No follow-ups on file.  Walker Kehr, MD

## 2019-01-19 DIAGNOSIS — R3912 Poor urinary stream: Secondary | ICD-10-CM | POA: Diagnosis not present

## 2019-01-19 DIAGNOSIS — Z8546 Personal history of malignant neoplasm of prostate: Secondary | ICD-10-CM | POA: Diagnosis not present

## 2019-01-19 DIAGNOSIS — N401 Enlarged prostate with lower urinary tract symptoms: Secondary | ICD-10-CM | POA: Diagnosis not present

## 2019-03-20 DIAGNOSIS — Z23 Encounter for immunization: Secondary | ICD-10-CM | POA: Diagnosis not present

## 2019-03-22 ENCOUNTER — Ambulatory Visit: Payer: Medicare Other

## 2019-04-07 ENCOUNTER — Ambulatory Visit: Payer: Self-pay

## 2019-04-18 DIAGNOSIS — Z23 Encounter for immunization: Secondary | ICD-10-CM | POA: Diagnosis not present

## 2019-04-30 DIAGNOSIS — H2513 Age-related nuclear cataract, bilateral: Secondary | ICD-10-CM | POA: Diagnosis not present

## 2019-05-10 ENCOUNTER — Other Ambulatory Visit: Payer: Self-pay | Admitting: Internal Medicine

## 2019-07-16 ENCOUNTER — Ambulatory Visit: Payer: Medicare Other | Admitting: Internal Medicine

## 2019-07-27 ENCOUNTER — Other Ambulatory Visit: Payer: Self-pay

## 2019-07-27 ENCOUNTER — Ambulatory Visit (INDEPENDENT_AMBULATORY_CARE_PROVIDER_SITE_OTHER): Payer: Medicare Other | Admitting: Internal Medicine

## 2019-07-27 ENCOUNTER — Encounter: Payer: Self-pay | Admitting: Internal Medicine

## 2019-07-27 DIAGNOSIS — E039 Hypothyroidism, unspecified: Secondary | ICD-10-CM

## 2019-07-27 DIAGNOSIS — C61 Malignant neoplasm of prostate: Secondary | ICD-10-CM | POA: Diagnosis not present

## 2019-07-27 DIAGNOSIS — I712 Thoracic aortic aneurysm, without rupture: Secondary | ICD-10-CM | POA: Diagnosis not present

## 2019-07-27 DIAGNOSIS — I7121 Aneurysm of the ascending aorta, without rupture: Secondary | ICD-10-CM

## 2019-07-27 DIAGNOSIS — I471 Supraventricular tachycardia: Secondary | ICD-10-CM | POA: Diagnosis not present

## 2019-07-27 LAB — HEPATIC FUNCTION PANEL
ALT: 12 U/L (ref 0–53)
AST: 18 U/L (ref 0–37)
Albumin: 4.9 g/dL (ref 3.5–5.2)
Alkaline Phosphatase: 55 U/L (ref 39–117)
Bilirubin, Direct: 0.1 mg/dL (ref 0.0–0.3)
Total Bilirubin: 0.5 mg/dL (ref 0.2–1.2)
Total Protein: 7.4 g/dL (ref 6.0–8.3)

## 2019-07-27 LAB — CBC WITH DIFFERENTIAL/PLATELET
Basophils Absolute: 0.1 10*3/uL (ref 0.0–0.1)
Basophils Relative: 0.6 % (ref 0.0–3.0)
Eosinophils Absolute: 0.1 10*3/uL (ref 0.0–0.7)
Eosinophils Relative: 0.9 % (ref 0.0–5.0)
HCT: 45 % (ref 39.0–52.0)
Hemoglobin: 15.3 g/dL (ref 13.0–17.0)
Lymphocytes Relative: 18.8 % (ref 12.0–46.0)
Lymphs Abs: 1.6 10*3/uL (ref 0.7–4.0)
MCHC: 34.1 g/dL (ref 30.0–36.0)
MCV: 92.7 fl (ref 78.0–100.0)
Monocytes Absolute: 0.8 10*3/uL (ref 0.1–1.0)
Monocytes Relative: 9.6 % (ref 3.0–12.0)
Neutro Abs: 5.8 10*3/uL (ref 1.4–7.7)
Neutrophils Relative %: 70.1 % (ref 43.0–77.0)
Platelets: 191 10*3/uL (ref 150.0–400.0)
RBC: 4.86 Mil/uL (ref 4.22–5.81)
RDW: 13.4 % (ref 11.5–15.5)
WBC: 8.3 10*3/uL (ref 4.0–10.5)

## 2019-07-27 LAB — URINALYSIS
Bilirubin Urine: NEGATIVE
Hgb urine dipstick: NEGATIVE
Ketones, ur: NEGATIVE
Leukocytes,Ua: NEGATIVE
Nitrite: NEGATIVE
Specific Gravity, Urine: 1.025 (ref 1.000–1.030)
Total Protein, Urine: NEGATIVE
Urine Glucose: NEGATIVE
Urobilinogen, UA: 0.2 (ref 0.0–1.0)
pH: 5.5 (ref 5.0–8.0)

## 2019-07-27 LAB — BASIC METABOLIC PANEL
BUN: 18 mg/dL (ref 6–23)
CO2: 25 mEq/L (ref 19–32)
Calcium: 9.7 mg/dL (ref 8.4–10.5)
Chloride: 102 mEq/L (ref 96–112)
Creatinine, Ser: 1.1 mg/dL (ref 0.40–1.50)
GFR: 66.29 mL/min (ref >60.00)
Glucose, Bld: 98 mg/dL (ref 70–99)
Potassium: 4.4 mEq/L (ref 3.5–5.1)
Sodium: 136 mEq/L (ref 135–145)

## 2019-07-27 LAB — LIPID PANEL
Cholesterol: 215 mg/dL — ABNORMAL HIGH (ref 0–200)
HDL: 53 mg/dL (ref >39.00)
LDL Cholesterol: 122 mg/dL — ABNORMAL HIGH (ref 0–99)
NonHDL: 162.05
Total CHOL/HDL Ratio: 4
Triglycerides: 200 mg/dL — ABNORMAL HIGH (ref 0.0–149.0)
VLDL: 40 mg/dL (ref 0.0–40.0)

## 2019-07-27 LAB — TSH: TSH: 3.18 u[IU]/mL (ref 0.35–4.50)

## 2019-07-27 NOTE — Assessment & Plan Note (Signed)
F/u w/Dr Wrenn  

## 2019-07-27 NOTE — Assessment & Plan Note (Signed)
CBC

## 2019-07-27 NOTE — Addendum Note (Signed)
Addended by: Trenda Moots on: 3/40/3709 12:06 PM   Modules accepted: Orders

## 2019-07-27 NOTE — Assessment & Plan Note (Signed)
On Levothyroxine 

## 2019-07-27 NOTE — Assessment & Plan Note (Signed)
BP Readings from Last 3 Encounters:  07/27/19 132/82  01/15/19 130/82  09/17/18 130/67

## 2019-07-27 NOTE — Assessment & Plan Note (Signed)
No relapse, except for minor episodes Labs

## 2019-07-27 NOTE — Progress Notes (Signed)
Subjective:  Patient ID: Jacob [name], male    DOB: 05-22-1950  Age: 69 y.o. MRN: [id number]  CC: No chief complaint on file.   HPI Samel Bruna presents for hypothyroidism, prostate cancer f/u  Outpatient Medications Prior to Visit  Medication Sig Dispense Refill  . amoxicillin (AMOXIL) 500 MG tablet TK 4 TS PO 1 HOUR PRIOR TO TREATMENT  0  . cholecalciferol (VITAMIN D) 1000 UNITS tablet Take 1,000 Units by mouth every evening.     Marland Kitchen glucosamine-chondroitin 500-400 MG tablet Take 2 tablets by mouth every evening.    Marland Kitchen KRILL OIL PO One daily    . levothyroxine (SYNTHROID) 50 MCG tablet TAKE 1 TABLET BY MOUTH EVERY DAY 90 tablet 3  . Multiple Vitamins-Minerals (MULTIVITAL PO) Take 1 tablet by mouth every evening.     Marland Kitchen OVER THE COUNTER MEDICATION Take 500 mg by mouth every evening. Lysin tab once daily    . L-Lysine 500 MG TABS Take by mouth.     No facility-administered medications prior to visit.    ROS: Review of Systems  Constitutional: Negative for appetite change, fatigue and unexpected weight change.  HENT: Negative for congestion, nosebleeds, sneezing, sore throat and trouble swallowing.   Eyes: Negative for itching and visual disturbance.  Respiratory: Negative for cough.   Cardiovascular: Negative for chest pain, palpitations and leg swelling.  Gastrointestinal: Negative for abdominal distention, blood in stool, diarrhea and nausea.  Genitourinary: Negative for frequency and hematuria.  Musculoskeletal: Negative for back pain, gait problem, joint swelling and neck pain.  Skin: Negative for rash.  Neurological: Negative for dizziness, tremors, speech difficulty and weakness.  Psychiatric/Behavioral: Negative for agitation, dysphoric mood and sleep disturbance. The patient is not nervous/anxious.     Objective:  BP 132/82 (BP Location: Right Arm, Patient Position: Sitting, Cuff Size: Normal)   Pulse 67   Temp 98.2 F (36.8 C) (Oral)   Ht 5\' 11"  (1.803  m)   Wt 197 lb (89.4 kg)   SpO2 96%   BMI 27.48 kg/m   BP Readings from Last 3 Encounters:  07/27/19 132/82  01/15/19 130/82  09/17/18 130/67    Wt Readings from Last 3 Encounters:  07/27/19 197 lb (89.4 kg)  01/15/19 199 lb (90.3 kg)  09/17/18 199 lb (90.3 kg)    Physical Exam Constitutional:      General: He is not in acute distress.    Appearance: He is well-developed.     Comments: NAD  Eyes:     Conjunctiva/sclera: Conjunctivae normal.     Pupils: Pupils are equal, round, and reactive to light.  Neck:     Thyroid: No thyromegaly.     Vascular: No JVD.  Cardiovascular:     Rate and Rhythm: Normal rate and regular rhythm.     Heart sounds: Normal heart sounds. No murmur heard.  No friction rub. No gallop.   Pulmonary:     Effort: Pulmonary effort is normal. No respiratory distress.     Breath sounds: Normal breath sounds. No wheezing or rales.  Chest:     Chest wall: No tenderness.  Abdominal:     General: Bowel sounds are normal. There is no distension.     Palpations: Abdomen is soft. There is no mass.     Tenderness: There is no abdominal tenderness. There is no guarding or rebound.  Musculoskeletal:        General: No tenderness. Normal range of motion.     Cervical back: Normal range  of motion.  Lymphadenopathy:     Cervical: No cervical adenopathy.  Skin:    General: Skin is warm and dry.     Findings: No rash.  Neurological:     Mental Status: He is alert and oriented to person, place, and time.     Cranial Nerves: No cranial nerve deficit.     Motor: No abnormal muscle tone.     Coordination: Coordination normal.     Gait: Gait normal.     Deep Tendon Reflexes: Reflexes are normal and symmetric.  Psychiatric:        Behavior: Behavior normal.        Thought Content: Thought content normal.        Judgment: Judgment normal.     Lab Results  Component Value Date   WBC 5.6 07/14/2018   HGB 15.2 07/14/2018   HCT 44.2 07/14/2018   PLT 176.0  07/14/2018   GLUCOSE 96 07/14/2018   CHOL 190 07/14/2018   TRIG 157.0 (H) 07/14/2018   HDL 48.70 07/14/2018   LDLDIRECT 126.2 10/24/2011   LDLCALC 110 (H) 07/14/2018   ALT 10 07/14/2018   AST 18 07/14/2018   NA 137 07/14/2018   K 4.2 07/14/2018   CL 103 07/14/2018   CREATININE 0.98 07/14/2018   BUN 13 07/14/2018   CO2 24 07/14/2018   TSH 3.00 07/14/2018   PSA 4.45 (H) 10/31/2015   INR 0.95 08/24/2016   HGBA1C 5.6 04/29/2013    DL FLUORO GUIDED NEEDLE PLC ASPIRATION / INJECTTION/LOC  Result Date: 01/14/2018 CLINICAL DATA:  Post traumatic arthritis, pain. EXAM: RIGHT HIP INJECTION UNDER FLUOROSCOPY FLUOROSCOPY TIME:  5 seconds; 8 uGym2 DAP TECHNIQUE: The procedure, risks (including but not limited to bleeding, infection, organ damage ), benefits, and alternatives were explained to the patient. Questions regarding the procedure were encouraged and answered. The patient understands and consents to the procedure. An appropriate skin entry site was determined under fluoroscopy. Site was marked, prepped with Betadine, draped in usual sterile fashion, infiltrated locally with 1% lidocaine. A 22 gauge spinal needle was advanced to the lateral margin of the femoral head. 1 ml lidocaine 1% injected easily. Contrast injection of 2 ml Omnipaque-300 showed intraarticular spread without any intravascular component. 120 mg Depo-Medrol and 5 ml lidocaine 1% was administered. The patient tolerated procedure well. COMPLICATIONS: None immediate IMPRESSION: 1. Technically successful  right hip injection under fluoroscopy Electronically Signed   By: Lucrezia Europe M.D.   On: 01/14/2018 11:21    Assessment & Plan:   Follow-up: No follow-ups on file.  Walker Kehr, MD

## 2019-07-27 NOTE — Addendum Note (Signed)
Addended by: Trenda Moots on: 11/04/3007 12:10 PM   Modules accepted: Orders

## 2019-07-29 ENCOUNTER — Encounter: Payer: Self-pay | Admitting: *Deleted

## 2019-08-07 DIAGNOSIS — Z8546 Personal history of malignant neoplasm of prostate: Secondary | ICD-10-CM | POA: Diagnosis not present

## 2019-12-07 DIAGNOSIS — Z23 Encounter for immunization: Secondary | ICD-10-CM | POA: Diagnosis not present

## 2019-12-24 DIAGNOSIS — D2261 Melanocytic nevi of right upper limb, including shoulder: Secondary | ICD-10-CM | POA: Diagnosis not present

## 2019-12-24 DIAGNOSIS — L570 Actinic keratosis: Secondary | ICD-10-CM | POA: Diagnosis not present

## 2019-12-24 DIAGNOSIS — D1801 Hemangioma of skin and subcutaneous tissue: Secondary | ICD-10-CM | POA: Diagnosis not present

## 2019-12-24 DIAGNOSIS — L821 Other seborrheic keratosis: Secondary | ICD-10-CM | POA: Diagnosis not present

## 2019-12-24 DIAGNOSIS — L814 Other melanin hyperpigmentation: Secondary | ICD-10-CM | POA: Diagnosis not present

## 2019-12-24 DIAGNOSIS — Z85828 Personal history of other malignant neoplasm of skin: Secondary | ICD-10-CM | POA: Diagnosis not present

## 2020-01-01 DIAGNOSIS — Z23 Encounter for immunization: Secondary | ICD-10-CM | POA: Diagnosis not present

## 2020-01-26 ENCOUNTER — Other Ambulatory Visit: Payer: Self-pay

## 2020-01-26 ENCOUNTER — Ambulatory Visit (INDEPENDENT_AMBULATORY_CARE_PROVIDER_SITE_OTHER): Payer: Medicare Other | Admitting: Internal Medicine

## 2020-01-26 ENCOUNTER — Encounter: Payer: Self-pay | Admitting: Internal Medicine

## 2020-01-26 VITALS — BP 128/80 | HR 56 | Temp 98.2°F | Ht 71.0 in | Wt 193.4 lb

## 2020-01-26 DIAGNOSIS — I2510 Atherosclerotic heart disease of native coronary artery without angina pectoris: Secondary | ICD-10-CM | POA: Diagnosis not present

## 2020-01-26 DIAGNOSIS — I2583 Coronary atherosclerosis due to lipid rich plaque: Secondary | ICD-10-CM

## 2020-01-26 DIAGNOSIS — R931 Abnormal findings on diagnostic imaging of heart and coronary circulation: Secondary | ICD-10-CM

## 2020-01-26 DIAGNOSIS — R002 Palpitations: Secondary | ICD-10-CM | POA: Diagnosis not present

## 2020-01-26 DIAGNOSIS — C61 Malignant neoplasm of prostate: Secondary | ICD-10-CM | POA: Diagnosis not present

## 2020-01-26 LAB — COMPREHENSIVE METABOLIC PANEL
ALT: 11 U/L (ref 0–53)
AST: 18 U/L (ref 0–37)
Albumin: 4.7 g/dL (ref 3.5–5.2)
Alkaline Phosphatase: 49 U/L (ref 39–117)
BUN: 18 mg/dL (ref 6–23)
CO2: 26 mEq/L (ref 19–32)
Calcium: 9.6 mg/dL (ref 8.4–10.5)
Chloride: 102 mEq/L (ref 96–112)
Creatinine, Ser: 0.98 mg/dL (ref 0.40–1.50)
GFR: 78.46 mL/min (ref >60.00)
Glucose, Bld: 103 mg/dL — ABNORMAL HIGH (ref 70–99)
Potassium: 4.2 mEq/L (ref 3.5–5.1)
Sodium: 136 mEq/L (ref 135–145)
Total Bilirubin: 0.6 mg/dL (ref 0.2–1.2)
Total Protein: 7.5 g/dL (ref 6.0–8.3)

## 2020-01-26 LAB — LIPID PANEL
Cholesterol: 205 mg/dL — ABNORMAL HIGH (ref 0–200)
HDL: 55.1 mg/dL (ref >39.00)
LDL Cholesterol: 125 mg/dL — ABNORMAL HIGH (ref 0–99)
NonHDL: 150.06
Total CHOL/HDL Ratio: 4
Triglycerides: 124 mg/dL (ref 0.0–149.0)
VLDL: 24.8 mg/dL (ref 0.0–40.0)

## 2020-01-26 LAB — CBC WITH DIFFERENTIAL/PLATELET
Basophils Absolute: 0 10*3/uL (ref 0.0–0.1)
Basophils Relative: 0.6 % (ref 0.0–3.0)
Eosinophils Absolute: 0.1 10*3/uL (ref 0.0–0.7)
Eosinophils Relative: 1.7 % (ref 0.0–5.0)
HCT: 45.5 % (ref 39.0–52.0)
Hemoglobin: 15.6 g/dL (ref 13.0–17.0)
Lymphocytes Relative: 42.9 % (ref 12.0–46.0)
Lymphs Abs: 2.3 10*3/uL (ref 0.7–4.0)
MCHC: 34.2 g/dL (ref 30.0–36.0)
MCV: 91 fl (ref 78.0–100.0)
Monocytes Absolute: 0.6 10*3/uL (ref 0.1–1.0)
Monocytes Relative: 11.1 % (ref 3.0–12.0)
Neutro Abs: 2.4 10*3/uL (ref 1.4–7.7)
Neutrophils Relative %: 43.7 % (ref 43.0–77.0)
Platelets: 185 10*3/uL (ref 150.0–400.0)
RBC: 5 Mil/uL (ref 4.22–5.81)
RDW: 13.1 % (ref 11.5–15.5)
WBC: 5.4 10*3/uL (ref 4.0–10.5)

## 2020-01-26 LAB — TSH: TSH: 4.52 u[IU]/mL — ABNORMAL HIGH (ref 0.35–4.50)

## 2020-01-26 MED ORDER — ATORVASTATIN CALCIUM 10 MG PO TABS: 10.0000 mg | ORAL_TABLET | Freq: Every day | ORAL | 11 refills | Status: DC

## 2020-01-26 MED ORDER — ATORVASTATIN CALCIUM 10 MG PO TABS
10.0000 mg | ORAL_TABLET | Freq: Every day | ORAL | 11 refills | Status: DC
Start: 2020-01-26 — End: 2020-01-26

## 2020-01-26 NOTE — Assessment & Plan Note (Addendum)
2019 Coronary calcium score of 188 Krill oil Other options were discussed.  Jacob [name] has agreed to try Lipitor 10 mg a day.  He can start with 5 mg every other day

## 2020-01-26 NOTE — Assessment & Plan Note (Signed)
Occ SVT rare

## 2020-01-26 NOTE — Progress Notes (Signed)
Subjective:  Patient ID: Jacob [name], male    DOB: 09-Feb-1951  Age: 69 y.o. MRN: [id number]  CC: Follow-up (6 month f/u)   HPI Jacob [name] presents for SVT, hypothyroidism, prostate cancer f/u Cycling 2 h every other day  Outpatient Medications Prior to Visit  Medication Sig Dispense Refill  . amoxicillin (AMOXIL) 500 MG tablet TK 4 TS PO 1 HOUR PRIOR TO TREATMENT  0  . cholecalciferol (VITAMIN D) 1000 UNITS tablet Take 1,000 Units by mouth every evening.     Marland Kitchen glucosamine-chondroitin 500-400 MG tablet Take 2 tablets by mouth every evening.    Marland Kitchen KRILL OIL PO One daily    . L-Lysine 500 MG TABS Take by mouth.    . levothyroxine (SYNTHROID) 50 MCG tablet TAKE 1 TABLET BY MOUTH EVERY DAY 90 tablet 3  . Multiple Vitamins-Minerals (MULTIVITAL PO) Take 1 tablet by mouth every evening.    Marland Kitchen OVER THE COUNTER MEDICATION Take 500 mg by mouth every evening. Lysin tab once daily     No facility-administered medications prior to visit.    ROS: Review of Systems  Constitutional: Negative for appetite change, fatigue and unexpected weight change.  HENT: Negative for congestion, nosebleeds, sneezing, sore throat and trouble swallowing.   Eyes: Negative for itching and visual disturbance.  Respiratory: Negative for cough.   Cardiovascular: Negative for chest pain, palpitations and leg swelling.  Gastrointestinal: Negative for abdominal distention, blood in stool, diarrhea and nausea.  Genitourinary: Negative for frequency and hematuria.  Musculoskeletal: Negative for back pain, gait problem, joint swelling and neck pain.  Skin: Negative for rash.  Neurological: Negative for dizziness, tremors, speech difficulty and weakness.  Psychiatric/Behavioral: Negative for agitation, dysphoric mood and sleep disturbance. The patient is not nervous/anxious.     Objective:  BP 128/80 (BP Location: Left Arm)   Pulse (!) 56   Temp 98.2 F (36.8 C) (Oral)   Ht 5\' 11"  (1.803 m)   Wt 193 lb  6.4 oz (87.7 kg)   SpO2 97%   BMI 26.97 kg/m   BP Readings from Last 3 Encounters:  01/26/20 128/80  07/27/19 132/82  01/15/19 130/82    Wt Readings from Last 3 Encounters:  01/26/20 193 lb 6.4 oz (87.7 kg)  07/27/19 197 lb (89.4 kg)  01/15/19 199 lb (90.3 kg)    Physical Exam Constitutional:      General: He is not in acute distress.    Appearance: He is well-developed.     Comments: NAD  HENT:     Mouth/Throat:     Mouth: Oropharynx is clear and moist.  Eyes:     Conjunctiva/sclera: Conjunctivae normal.     Pupils: Pupils are equal, round, and reactive to light.  Neck:     Thyroid: No thyromegaly.     Vascular: No JVD.  Cardiovascular:     Rate and Rhythm: Normal rate and regular rhythm.     Pulses: Intact distal pulses.     Heart sounds: Normal heart sounds. No murmur heard. No friction rub. No gallop.   Pulmonary:     Effort: Pulmonary effort is normal. No respiratory distress.     Breath sounds: Normal breath sounds. No wheezing or rales.  Chest:     Chest wall: No tenderness.  Abdominal:     General: Bowel sounds are normal. There is no distension.     Palpations: Abdomen is soft. There is no mass.     Tenderness: There is no abdominal tenderness. There is  no guarding or rebound.  Musculoskeletal:        General: No tenderness or edema. Normal range of motion.     Cervical back: Normal range of motion.  Lymphadenopathy:     Cervical: No cervical adenopathy.  Skin:    General: Skin is warm and dry.     Findings: No rash.  Neurological:     Mental Status: He is alert and oriented to person, place, and time.     Cranial Nerves: No cranial nerve deficit.     Motor: No abnormal muscle tone.     Coordination: He displays a negative Romberg sign. Coordination normal.     Gait: Gait normal.     Deep Tendon Reflexes: Reflexes are normal and symmetric.  Psychiatric:        Mood and Affect: Mood and affect normal.        Behavior: Behavior normal.         Thought Content: Thought content normal.        Judgment: Judgment normal.     Lab Results  Component Value Date   WBC 8.3 07/27/2019   HGB 15.3 07/27/2019   HCT 45.0 07/27/2019   PLT 191.0 07/27/2019   GLUCOSE 98 07/27/2019   CHOL 215 (H) 07/27/2019   TRIG 200.0 (H) 07/27/2019   HDL 53.00 07/27/2019   LDLDIRECT 126.2 10/24/2011   LDLCALC 122 (H) 07/27/2019   ALT 12 07/27/2019   AST 18 07/27/2019   NA 136 07/27/2019   K 4.4 07/27/2019   CL 102 07/27/2019   CREATININE 1.10 07/27/2019   BUN 18 07/27/2019   CO2 25 07/27/2019   TSH 3.18 07/27/2019   PSA 4.45 (H) 10/31/2015   INR 0.95 08/24/2016   HGBA1C 5.6 04/29/2013    DL FLUORO GUIDED NEEDLE PLC ASPIRATION / INJECTTION/LOC  Result Date: 01/14/2018 CLINICAL DATA:  Post traumatic arthritis, pain. EXAM: RIGHT HIP INJECTION UNDER FLUOROSCOPY FLUOROSCOPY TIME:  5 seconds; 8 uGym2 DAP TECHNIQUE: The procedure, risks (including but not limited to bleeding, infection, organ damage ), benefits, and alternatives were explained to the patient. Questions regarding the procedure were encouraged and answered. The patient understands and consents to the procedure. An appropriate skin entry site was determined under fluoroscopy. Site was marked, prepped with Betadine, draped in usual sterile fashion, infiltrated locally with 1% lidocaine. A 22 gauge spinal needle was advanced to the lateral margin of the femoral head. 1 ml lidocaine 1% injected easily. Contrast injection of 2 ml Omnipaque-300 showed intraarticular spread without any intravascular component. 120 mg Depo-Medrol and 5 ml lidocaine 1% was administered. The patient tolerated procedure well. COMPLICATIONS: None immediate IMPRESSION: 1. Technically successful  right hip injection under fluoroscopy Electronically Signed   By: Lucrezia Europe M.D.   On: 01/14/2018 11:21    Assessment & Plan:    Walker Kehr, MD

## 2020-01-26 NOTE — Assessment & Plan Note (Signed)
Dr Jeffie Pollock next week

## 2020-01-26 NOTE — Assessment & Plan Note (Signed)
Coronary calcium score of 188 Krill oil

## 2020-01-28 ENCOUNTER — Telehealth: Payer: Self-pay | Admitting: Internal Medicine

## 2020-01-28 DIAGNOSIS — Z8546 Personal history of malignant neoplasm of prostate: Secondary | ICD-10-CM | POA: Diagnosis not present

## 2020-01-28 NOTE — Telephone Encounter (Signed)
Called pt back he is wanting to know if he need to see MD or just labs. Inform pt he just need labs. Made appt for lab for 04/18/20.Marland KitchenJohny Chess

## 2020-01-28 NOTE — Telephone Encounter (Signed)
    Please return call with lab results

## 2020-02-04 DIAGNOSIS — Z8546 Personal history of malignant neoplasm of prostate: Secondary | ICD-10-CM | POA: Diagnosis not present

## 2020-02-04 DIAGNOSIS — R3912 Poor urinary stream: Secondary | ICD-10-CM | POA: Diagnosis not present

## 2020-04-06 ENCOUNTER — Telehealth: Payer: Self-pay | Admitting: Internal Medicine

## 2020-04-06 NOTE — Telephone Encounter (Signed)
LVM for pt to rtn my call to schedule AWV with [facility]. Please schedule appt if pt calls the office.  

## 2020-04-14 ENCOUNTER — Telehealth: Payer: Self-pay | Admitting: *Deleted

## 2020-04-14 DIAGNOSIS — E039 Hypothyroidism, unspecified: Secondary | ICD-10-CM

## 2020-04-14 NOTE — Telephone Encounter (Signed)
Rec'd msg from lab check-in " Hey, no rush but [phone] has a lab appt 3/7 but no lab orders". Verified chart pt is suppose to have TSH & FT4 check. Place orders.Marland KitchenJohny Chess

## 2020-04-18 ENCOUNTER — Other Ambulatory Visit (INDEPENDENT_AMBULATORY_CARE_PROVIDER_SITE_OTHER): Payer: Medicare Other

## 2020-04-18 ENCOUNTER — Other Ambulatory Visit: Payer: Self-pay

## 2020-04-18 ENCOUNTER — Ambulatory Visit (INDEPENDENT_AMBULATORY_CARE_PROVIDER_SITE_OTHER): Payer: Medicare Other

## 2020-04-18 VITALS — BP 120/82 | HR 56 | Temp 98.3°F | Ht 71.0 in | Wt 192.4 lb

## 2020-04-18 DIAGNOSIS — Z0000 Encounter for general adult medical examination without abnormal findings: Secondary | ICD-10-CM

## 2020-04-18 DIAGNOSIS — E039 Hypothyroidism, unspecified: Secondary | ICD-10-CM | POA: Diagnosis not present

## 2020-04-18 LAB — TSH: TSH: 4.43 u[IU]/mL (ref 0.35–4.50)

## 2020-04-18 LAB — T4, FREE: Free T4: 0.74 ng/dL (ref 0.60–1.60)

## 2020-04-18 NOTE — Progress Notes (Addendum)
Subjective:   Jacob [name] is a 70 y.o. male who presents for Medicare Annual/Subsequent preventive examination.  Review of Systems    No ROS. Medicare Wellness Visit. Additional risk factors are reflected in social history. Cardiac Risk Factors include: advanced age (>22men, >62 women);dyslipidemia;family history of premature cardiovascular disease;male gender Sleep Patterns: No sleep issues, feels rested on waking and sleeps 6-8 hours nightly. Home Safety/Smoke Alarms: Feels safe in home; uses home alarm. Smoke alarms in place. Living environment: 2-story home; Lives with spouse; no needs for DME; good support system. Seat Belt Safety/Bike Helmet: Wears seat belt.    Objective:    Today's Vitals   04/18/20 0939  BP: 120/82  Pulse: (!) 56  Temp: 98.3 F (36.8 C)  SpO2: 96%  Weight: 192 lb 6.4 oz (87.3 kg)  Height: 5\' 11"  (1.803 m)  PainSc: 0-No pain   Body mass index is 26.83 kg/m.  Advanced Directives 04/18/2020 12/12/2016 09/21/2016 08/30/2016 08/24/2013  Does Patient Have a Medical Advance Directive? Yes Yes Yes Yes Patient has advance directive, copy not in chart  Type of Advance Directive Living will;Healthcare Power of [place];Living will [place];Living will [place];Living will [place];Living will  Does patient want to make changes to medical advance directive? No - Patient declined - - - -  Copy of [place] in Chart? No - copy requested No - copy requested No - copy requested No - copy requested -    Current Medications (verified) Outpatient Encounter Medications as of 04/18/2020  Medication Sig   amoxicillin (AMOXIL) 500 MG tablet TK 4 TS PO 1 HOUR PRIOR TO TREATMENT   atorvastatin (LIPITOR) 10 MG tablet Take 1 tablet (10 mg total) by mouth daily.   cholecalciferol (VITAMIN D) 1000 UNITS tablet Take 1,000 Units by mouth every evening.     glucosamine-chondroitin 500-400 MG tablet Take 2 tablets by mouth every evening.   KRILL OIL PO One daily   L-Lysine 500 MG TABS Take by mouth.   levothyroxine (SYNTHROID) 50 MCG tablet TAKE 1 TABLET BY MOUTH EVERY DAY   Multiple Vitamins-Minerals (MULTIVITAL PO) Take 1 tablet by mouth every evening.   OVER THE COUNTER MEDICATION Take 500 mg by mouth every evening. Lysin tab once daily   No facility-administered encounter medications on file as of 04/18/2020.    Allergies (verified) Pneumovax [pneumococcal polysaccharide vaccine]   History: Past Medical History:  Diagnosis Date   Anemia due to blood loss, acute 12/11/2010   10/12 post polypectomy lower GI bleed; Dr Glennon Hamilton    Eczema    hot tub related   GI bleed    s/p polypectomy   Herpes zoster 2010   Hx of colonic polyps 12/11/2010   Last colon 2012 Dr Carlean Purl   Prostate cancer [facility])    SVT (supraventricular tachycardia) [facility])    Past Surgical History:  Procedure Laterality Date   APPENDECTOMY  05/2010   COLONOSCOPY W/ BIOPSIES     CYSTOSCOPY N/A 08/30/2016   Procedure: CYSTOSCOPY FLEXIBLE;  Surgeon: Irine Seal, MD;  Location: [facility];  Service: Urology;  Laterality: N/A;  no seeds found in bladder   ELECTROPHYSIOLOGY STUDY  08/24/13   EPS by Dr Lovena Le with no inducible SVT or evidence of dual AV nodal physiology   PROSTATE BIOPSY     RADIOACTIVE SEED IMPLANT N/A 08/30/2016   Procedure: RADIOACTIVE SEED IMPLANT/BRACHYTHERAPY IMPLANT WITH SPACE OAR;  Surgeon: Jeffie Pollock,  Jenny Reichmann, MD;  Location: [facility];  Service: Urology;  Laterality: N/A;    62   seeds implanted   ROTATOR CUFF REPAIR  2003   right   SUPRAVENTRICULAR TACHYCARDIA ABLATION N/A 08/24/2013   Procedure: SUPRAVENTRICULAR TACHYCARDIA ABLATION;  Surgeon: Evans Lance, MD;  Location: [facility] CATH LAB;  Service: Cardiovascular;  Laterality: N/A;   Family History  Problem Relation Age of Onset   Heart disease Mother        pacemaker   Cancer  Mother        lung/smoker/passed at age 21   Heart attack Father    Cancer Paternal Uncle        pancreatic   Social History   Socioeconomic History   Marital status: Married    Spouse name: Not on file   Number of children: 2   Years of education: Not on file   Highest education level: Not on file  Occupational History   Occupation: retired  Tobacco Use   Smoking status: Passive Smoke Exposure - Never Smoker   Smokeless tobacco: Never Used   Tobacco comment: occasional  Vaping Use   Vaping Use: Never used  Substance and Sexual Activity   Alcohol use: Yes    Comment: rare-weekend drinker   Drug use: No   Sexual activity: Yes  Other Topics Concern   Not on file  Social History Narrative   Married, 2 kids and + grandchildren   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   Regular exercise- yes, cycling   Social Determinants of Radio broadcast assistant Strain: Low Risk    Difficulty of Paying Living Expenses: Not hard at all  Food Insecurity: No Food Insecurity   Worried About Charity fundraiser in the Last Year: Never true   Arboriculturist in the Last Year: Never true  Transportation Needs: No Transportation Needs   Lack of Transportation (Medical): No   Lack of Transportation (Non-Medical): No  Physical Activity: Sufficiently Active   Days of Exercise per Week: 5 days   Minutes of Exercise per Session: 30 min  Stress: No Stress Concern Present   Feeling of Stress : Not at all  Social Connections: Socially Integrated   Frequency of Communication with Friends and Family: More than three times a week   Frequency of Social Gatherings with Friends and Family: More than three times a week   Attends Religious Services: More than 4 times per year   Active Member of [employer] or Organizations: Yes   Attends Music therapist: More than 4 times per year   Marital Status: Married    Tobacco Counseling Counseling given: Not  Answered Comment: occasional   Clinical Intake:  Pre-visit preparation completed: Yes  Pain : No/denies pain Pain Score: 0-No pain     BMI - recorded: 26.83 Nutritional Status: BMI 25 -29 Overweight Nutritional Risks: None Diabetes: No  How often do you need to have someone help you when you read instructions, pamphlets, or other written materials from your doctor or pharmacy?: 1 - Never What is the last grade level you completed in school?: High School Graduate  Diabetic? no  Interpreter Needed?: No  Information entered by :: Lisette Abu, LPN   Activities of Daily Living In your present state of health, do you have any difficulty performing the following activities: 04/18/2020  Hearing? N  Vision? N  Difficulty concentrating or making decisions? N  Walking or climbing stairs?  N  Dressing or bathing? N  Doing errands, shopping? N  Preparing Food and eating ? N  Using the Toilet? N  In the past six months, have you accidently leaked urine? N  Do you have problems with loss of bowel control? N  Managing your Medications? N  Managing your Finances? N  Some recent data might be hidden    Patient Care Team: Plotnikov, Evie Lacks, MD as PCP - General Rosana Berger, MD as Referring Physician (Gastroenterology) Tyler Pita, MD as Consulting Physician (Radiation Oncology) Irine Seal, MD as Attending Physician (Urology) Nahser, Wonda Cheng, MD as Consulting Physician (Cardiology) Evans Lance, MD as Consulting Physician (Cardiology) Croitoru, Dani Gobble, MD as Consulting Physician (Cardiology)  Indicate any recent Medical Services you may have received from other than [facility] providers in the past year (date may be approximate).     Assessment:   This is a routine wellness examination for Gertrude.  Hearing/Vision screen No exam data present  Dietary issues and exercise activities discussed: Current Exercise Habits: Home exercise routine, Type of exercise:  walking, Time (Minutes): 30, Frequency (Times/Week): 5, Weekly Exercise (Minutes/Week): 150, Intensity: Moderate, Exercise limited by: cardiac condition(s)  Goals      lose weight     Continue to be physically active and eat healthy. Maintain current healthy status       Depression Screen PHQ 2/9 Scores 04/18/2020 01/15/2019 12/12/2016 09/21/2016 06/04/2016 11/01/2015  PHQ - 2 Score 0 0 0 0 0 0  PHQ- 9 Score - - 0 - - -    Fall Risk Fall Risk  04/18/2020 12/30/2017 12/12/2016 09/21/2016 06/04/2016  Falls in the past year? 0 0 No No No  Comment - Emmi Telephone Survey: data to providers prior to load - - -  Number falls in past yr: 0 - - - -  Injury with Fall? 0 - - - -  Risk for fall due to : No Fall Risks - - - -  Follow up Falls evaluation completed - - - -    FALL RISK PREVENTION PERTAINING TO THE HOME:  Any stairs in or around the home? Yes  If so, are there any without handrails? No  Home free of loose throw rugs in walkways, pet beds, electrical cords, etc? Yes  Adequate lighting in your home to reduce risk of falls? Yes   ASSISTIVE DEVICES UTILIZED TO PREVENT FALLS:  Life alert? No  Use of a cane, walker or w/c? No  Grab bars in the bathroom? No  Shower chair or bench in shower? Yes  Elevated toilet seat or a handicapped toilet? No   TIMED UP AND GO:  Was the test performed? No .  Length of time to ambulate 10 feet: 0 sec.   Gait steady and fast without use of assistive device  Cognitive Function: Normal cognitive status assessed by direct observation by this Nurse Health Advisor. No abnormalities found.          Immunizations Immunization History  Administered Date(s) Administered   Fluad Quad(high Dose 65+) 12/29/2019   Influenza Split 10/30/2011   Influenza Whole 12/01/2003, 11/11/2007, 11/23/2008, 11/13/2010   Influenza, High Dose Seasonal PF 12/12/2016, 12/04/2018   Influenza,inj,Quad PF,6+ Mos 11/01/2015   Influenza-Unspecified 11/12/2013, 12/13/2017    Moderna SARS-COV2 Booster Vaccination 01/01/2020   Moderna Sars-Covid-2 Vaccination 03/20/2019, 04/18/2019   Pneumococcal Conjugate-13 11/01/2015   Pneumococcal Polysaccharide-23 12/12/2016   Tdap 03/14/2011   Zoster 04/28/2013    TDAP status: Up to date  Flu  Vaccine status: Up to date  Pneumococcal vaccine status: Up to date  Covid-19 vaccine status: Completed vaccines  Qualifies for Shingles Vaccine? Yes   Zostavax completed Yes   Shingrix Completed?: Yes  Screening Tests Health Maintenance  Topic Date Due   Hepatitis C Screening  Never done   COVID-19 Vaccine (4 - Booster for Moderna series) 06/30/2020   TETANUS/TDAP  03/13/2021   COLONOSCOPY (Pts 45-66yrs Insurance coverage will need to be confirmed)  09/17/2023   INFLUENZA VACCINE  Completed   PNA vac Low Risk Adult  Completed   HPV VACCINES  Aged Out    Health Maintenance  Health Maintenance Due  Topic Date Due   Hepatitis C Screening  Never done    Colorectal cancer screening: Type of screening: Colonoscopy. Completed 09/17/2018. Repeat every 5 years  Lung Cancer Screening: (Low Dose CT Chest recommended if Age 45-80 years, 30 pack-year currently smoking OR have quit w/in 15years.) does not qualify.   Lung Cancer Screening Referral: no  Additional Screening:  Hepatitis C Screening: does qualify; Completed no  Vision Screening: Recommended annual ophthalmology exams for early detection of glaucoma and other disorders of the eye. Is the patient up to date with their annual eye exam?  Yes  Who is the provider or what is the name of the office in which the patient attends annual eye exams? Rutherford Guys, MD. If pt is not established with a provider, would they like to be referred to a provider to establish care? No .   Dental Screening: Recommended annual dental exams for proper oral hygiene  Community Resource Referral / Chronic Care Management: CRR required this visit?  No   CCM required this visit?  No       Plan:     I have personally reviewed and noted the following in the patient's chart:   Medical and social history Use of alcohol, tobacco or illicit drugs  Current medications and supplements Functional ability and status Nutritional status Physical activity Advanced directives List of other physicians Hospitalizations, surgeries, and ER visits in previous 12 months Vitals Screenings to include cognitive, depression, and falls Referrals and appointments  In addition, I have reviewed and discussed with patient certain preventive protocols, quality metrics, and best practice recommendations. A written personalized care plan for preventive services as well as general preventive health recommendations were provided to patient.     Sheral Flow, LPN   04/20/1015   Nurse Notes:  Medications reviewed with patient; no opioid use noted.  Medical screening examination/treatment/procedure(s) were performed by non-physician practitioner and as supervising physician I was immediately available for consultation/collaboration.  I agree with above. Lew Dawes, MD

## 2020-04-18 NOTE — Patient Instructions (Signed)
Jacob [name] , Thank you for taking time to come for your Medicare Wellness Visit. I appreciate your ongoing commitment to your health goals. Please review the following plan we discussed and let me know if I can assist you in the future.   Screening recommendations/referrals: Colonoscopy: last done 09/17/2018; due every 5 years Recommended yearly ophthalmology/optometry visit for glaucoma screening and checkup Recommended yearly dental visit for hygiene and checkup  Vaccinations: Influenza vaccine: 12/29/2019 Pneumococcal vaccine: 11/01/2015, 12/12/2016 Tdap vaccine: 03/14/2011; due every 10 years Shingles vaccine: Up to date (per patient)  Covid-19: 03/20/2019, 04/18/2019, 01/01/2020  Advanced directives: Please bring a copy of your health care power of attorney and living will to the office at your convenience.  Conditions/risks identified: Yes; Reviewed health maintenance screenings with patient today and relevant education, vaccines, and/or referrals were provided. Please continue to do your personal lifestyle choices by: daily care of teeth and gums, regular physical activity (goal should be 5 days a week for 30 minutes), eat a healthy diet, avoid tobacco and drug use, limiting any alcohol intake, taking a low-dose aspirin (if not allergic or have been advised by your provider otherwise) and taking vitamins and minerals as recommended by your provider. Continue doing brain stimulating activities (puzzles, reading, adult coloring books, staying active) to keep memory sharp. Continue to eat heart healthy diet (full of fruits, vegetables, whole grains, lean protein, water--limit salt, fat, and sugar intake) and increase physical activity as tolerated.  Next appointment: Please schedule your next Medicare Wellness Visit with your Nurse Health Advisor in 1 year by calling [phone].  Preventive Care 24 Years and Older, Male Preventive care refers to lifestyle choices and visits with your health  care provider that can promote health and wellness. What does preventive care include?  A yearly physical exam. This is also called an annual well check.  Dental exams once or twice a year.  Routine eye exams. Ask your health care provider how often you should have your eyes checked.  Personal lifestyle choices, including:  Daily care of your teeth and gums.  Regular physical activity.  Eating a healthy diet.  Avoiding tobacco and drug use.  Limiting alcohol use.  Practicing safe sex.  Taking low doses of aspirin every day.  Taking vitamin and mineral supplements as recommended by your health care provider. What happens during an annual well check? The services and screenings done by your health care provider during your annual well check will depend on your age, overall health, lifestyle risk factors, and family history of disease. Counseling  Your health care provider may ask you questions about your:  Alcohol use.  Tobacco use.  Drug use.  Emotional well-being.  Home and relationship well-being.  Sexual activity.  Eating habits.  History of falls.  Memory and ability to understand (cognition).  Work and work Statistician. Screening  You may have the following tests or measurements:  Height, weight, and BMI.  Blood pressure.  Lipid and cholesterol levels. These may be checked every 5 years, or more frequently if you are over 82 years old.  Skin check.  Lung cancer screening. You may have this screening every year starting at age 39 if you have a 30-pack-year history of smoking and currently smoke or have quit within the past 15 years.  Fecal occult blood test (FOBT) of the stool. You may have this test every year starting at age 45.  Flexible sigmoidoscopy or colonoscopy. You may have a sigmoidoscopy every 5 years or a colonoscopy  every 10 years starting at age 57.  Prostate cancer screening. Recommendations will vary depending on your family  history and other risks.  Hepatitis C blood test.  Hepatitis B blood test.  Sexually transmitted disease (STD) testing.  Diabetes screening. This is done by checking your blood sugar (glucose) after you have not eaten for a while (fasting). You may have this done every 1-3 years.  Abdominal aortic aneurysm (AAA) screening. You may need this if you are a current or former smoker.  Osteoporosis. You may be screened starting at age 60 if you are at high risk. Talk with your health care provider about your test results, treatment options, and if necessary, the need for more tests. Vaccines  Your health care provider may recommend certain vaccines, such as:  Influenza vaccine. This is recommended every year.  Tetanus, diphtheria, and acellular pertussis (Tdap, Td) vaccine. You may need a Td booster every 10 years.  Zoster vaccine. You may need this after age 70.  Pneumococcal 13-valent conjugate (PCV13) vaccine. One dose is recommended after age 75.  Pneumococcal polysaccharide (PPSV23) vaccine. One dose is recommended after age 59. Talk to your health care provider about which screenings and vaccines you need and how often you need them. This information is not intended to replace advice given to you by your health care provider. Make sure you discuss any questions you have with your health care provider. Document Released: 02/25/2015 Document Revised: 10/19/2015 Document Reviewed: 11/30/2014 Elsevier Interactive Patient Education  2017 [place] Prevention in the Home Falls can cause injuries. They can happen to people of all ages. There are many things you can do to make your home safe and to help prevent falls. What can I do on the outside of my home?  Regularly fix the edges of walkways and driveways and fix any cracks.  Remove anything that might make you trip as you walk through a door, such as a raised step or threshold.  Trim any bushes or trees on the path to  your home.  Use bright outdoor lighting.  Clear any walking paths of anything that might make someone trip, such as rocks or tools.  Regularly check to see if handrails are loose or broken. Make sure that both sides of any steps have handrails.  Any raised decks and porches should have guardrails on the edges.  Have any leaves, snow, or ice cleared regularly.  Use sand or salt on walking paths during winter.  Clean up any spills in your garage right away. This includes oil or grease spills. What can I do in the bathroom?  Use night lights.  Install grab bars by the toilet and in the tub and shower. Do not use towel bars as grab bars.  Use non-skid mats or decals in the tub or shower.  If you need to sit down in the shower, use a plastic, non-slip stool.  Keep the floor dry. Clean up any water that spills on the floor as soon as it happens.  Remove soap buildup in the tub or shower regularly.  Attach bath mats securely with double-sided non-slip rug tape.  Do not have throw rugs and other things on the floor that can make you trip. What can I do in the bedroom?  Use night lights.  Make sure that you have a light by your bed that is easy to reach.  Do not use any sheets or blankets that are too big for your bed. They should  not hang down onto the floor.  Have a firm chair that has side arms. You can use this for support while you get dressed.  Do not have throw rugs and other things on the floor that can make you trip. What can I do in the kitchen?  Clean up any spills right away.  Avoid walking on wet floors.  Keep items that you use a lot in easy-to-reach places.  If you need to reach something above you, use a strong step stool that has a grab bar.  Keep electrical cords out of the way.  Do not use floor polish or wax that makes floors slippery. If you must use wax, use non-skid floor wax.  Do not have throw rugs and other things on the floor that can make  you trip. What can I do with my stairs?  Do not leave any items on the stairs.  Make sure that there are handrails on both sides of the stairs and use them. Fix handrails that are broken or loose. Make sure that handrails are as long as the stairways.  Check any carpeting to make sure that it is firmly attached to the stairs. Fix any carpet that is loose or worn.  Avoid having throw rugs at the top or bottom of the stairs. If you do have throw rugs, attach them to the floor with carpet tape.  Make sure that you have a light switch at the top of the stairs and the bottom of the stairs. If you do not have them, ask someone to add them for you. What else can I do to help prevent falls?  Wear shoes that:  Do not have high heels.  Have rubber bottoms.  Are comfortable and fit you well.  Are closed at the toe. Do not wear sandals.  If you use a stepladder:  Make sure that it is fully opened. Do not climb a closed stepladder.  Make sure that both sides of the stepladder are locked into place.  Ask someone to hold it for you, if possible.  Clearly mark and make sure that you can see:  Any grab bars or handrails.  First and last steps.  Where the edge of each step is.  Use tools that help you move around (mobility aids) if they are needed. These include:  Canes.  Walkers.  Scooters.  Crutches.  Turn on the lights when you go into a dark area. Replace any light bulbs as soon as they burn out.  Set up your furniture so you have a clear path. Avoid moving your furniture around.  If any of your floors are uneven, fix them.  If there are any pets around you, be aware of where they are.  Review your medicines with your doctor. Some medicines can make you feel dizzy. This can increase your chance of falling. Ask your doctor what other things that you can do to help prevent falls. This information is not intended to replace advice given to you by your health care provider.  Make sure you discuss any questions you have with your health care provider. Document Released: 11/25/2008 Document Revised: 07/07/2015 Document Reviewed: 03/05/2014 Elsevier Interactive Patient Education  2017 [employer].

## 2020-05-02 DIAGNOSIS — H2513 Age-related nuclear cataract, bilateral: Secondary | ICD-10-CM | POA: Diagnosis not present

## 2020-05-02 DIAGNOSIS — H25013 Cortical age-related cataract, bilateral: Secondary | ICD-10-CM | POA: Diagnosis not present

## 2020-05-05 ENCOUNTER — Other Ambulatory Visit: Payer: Self-pay | Admitting: Internal Medicine

## 2020-07-19 DIAGNOSIS — Z8546 Personal history of malignant neoplasm of prostate: Secondary | ICD-10-CM | POA: Diagnosis not present

## 2020-07-26 ENCOUNTER — Other Ambulatory Visit: Payer: Self-pay

## 2020-07-26 ENCOUNTER — Ambulatory Visit (INDEPENDENT_AMBULATORY_CARE_PROVIDER_SITE_OTHER): Payer: Medicare Other | Admitting: Internal Medicine

## 2020-07-26 ENCOUNTER — Encounter: Payer: Self-pay | Admitting: Internal Medicine

## 2020-07-26 VITALS — BP 130/78 | HR 54 | Temp 98.9°F | Ht 71.0 in | Wt 193.2 lb

## 2020-07-26 DIAGNOSIS — I712 Thoracic aortic aneurysm, without rupture: Secondary | ICD-10-CM

## 2020-07-26 DIAGNOSIS — M25559 Pain in unspecified hip: Secondary | ICD-10-CM | POA: Insufficient documentation

## 2020-07-26 DIAGNOSIS — C61 Malignant neoplasm of prostate: Secondary | ICD-10-CM | POA: Diagnosis not present

## 2020-07-26 DIAGNOSIS — I2510 Atherosclerotic heart disease of native coronary artery without angina pectoris: Secondary | ICD-10-CM | POA: Diagnosis not present

## 2020-07-26 DIAGNOSIS — I2583 Coronary atherosclerosis due to lipid rich plaque: Secondary | ICD-10-CM

## 2020-07-26 DIAGNOSIS — I7121 Aneurysm of the ascending aorta, without rupture: Secondary | ICD-10-CM

## 2020-07-26 DIAGNOSIS — E7800 Pure hypercholesterolemia, unspecified: Secondary | ICD-10-CM

## 2020-07-26 LAB — LIPID PANEL
Cholesterol: 168 mg/dL (ref 0–200)
HDL: 54.9 mg/dL (ref >39.00)
LDL Cholesterol: 95 mg/dL (ref 0–99)
NonHDL: 113.55
Total CHOL/HDL Ratio: 3
Triglycerides: 91 mg/dL (ref 0.0–149.0)
VLDL: 18.2 mg/dL (ref 0.0–40.0)

## 2020-07-26 LAB — COMPREHENSIVE METABOLIC PANEL
ALT: 9 U/L (ref 0–53)
AST: 15 U/L (ref 0–37)
Albumin: 4.7 g/dL (ref 3.5–5.2)
Alkaline Phosphatase: 49 U/L (ref 39–117)
BUN: 19 mg/dL (ref 6–23)
CO2: 27 mEq/L (ref 19–32)
Calcium: 9.6 mg/dL (ref 8.4–10.5)
Chloride: 103 mEq/L (ref 96–112)
Creatinine, Ser: 1.11 mg/dL (ref 0.40–1.50)
GFR: 67.33 mL/min (ref >60.00)
Glucose, Bld: 98 mg/dL (ref 70–99)
Potassium: 4.5 mEq/L (ref 3.5–5.1)
Sodium: 137 mEq/L (ref 135–145)
Total Bilirubin: 0.8 mg/dL (ref 0.2–1.2)
Total Protein: 7.5 g/dL (ref 6.0–8.3)

## 2020-07-26 NOTE — Assessment & Plan Note (Signed)
No CP On Lipitor
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
## 2020-07-26 NOTE — Assessment & Plan Note (Signed)
F/u w/Urology 
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
## 2020-07-26 NOTE — Assessment & Plan Note (Signed)
F/u w/Dr Croitoru

## 2020-07-26 NOTE — Addendum Note (Signed)
Addended by: Jacobo Forest on: 07/26/2020 10:22 AM   Modules accepted: Orders

## 2020-07-26 NOTE — Assessment & Plan Note (Signed)
On Krill oil, Lipitor

## 2020-07-27 DIAGNOSIS — N401 Enlarged prostate with lower urinary tract symptoms: Secondary | ICD-10-CM | POA: Diagnosis not present

## 2020-07-27 DIAGNOSIS — R3912 Poor urinary stream: Secondary | ICD-10-CM | POA: Diagnosis not present

## 2020-07-27 DIAGNOSIS — Z8546 Personal history of malignant neoplasm of prostate: Secondary | ICD-10-CM | POA: Diagnosis not present

## 2020-07-27 DIAGNOSIS — R351 Nocturia: Secondary | ICD-10-CM | POA: Diagnosis not present

## 2020-08-27 IMAGING — XA DG FLUORO GUIDE NDL PLC/BX
1 series · 1 of 1 positions shown · non-contrast
Comparison: none

CLINICAL DATA: Post traumatic arthritis, pain.

EXAM:
RIGHT HIP INJECTION UNDER FLUOROSCOPY
FLUOROSCOPY TIME:  5 seconds; 8 uRymP DAP
TECHNIQUE: The procedure, risks (including but not limited to bleeding,
infection, organ damage ), benefits, and alternatives were explained
to the patient. Questions regarding the procedure were encouraged
and answered. The patient understands and consents to the procedure.

[Series 1: ortho standard · 1 of 1 slices shown]
[im 1/1]
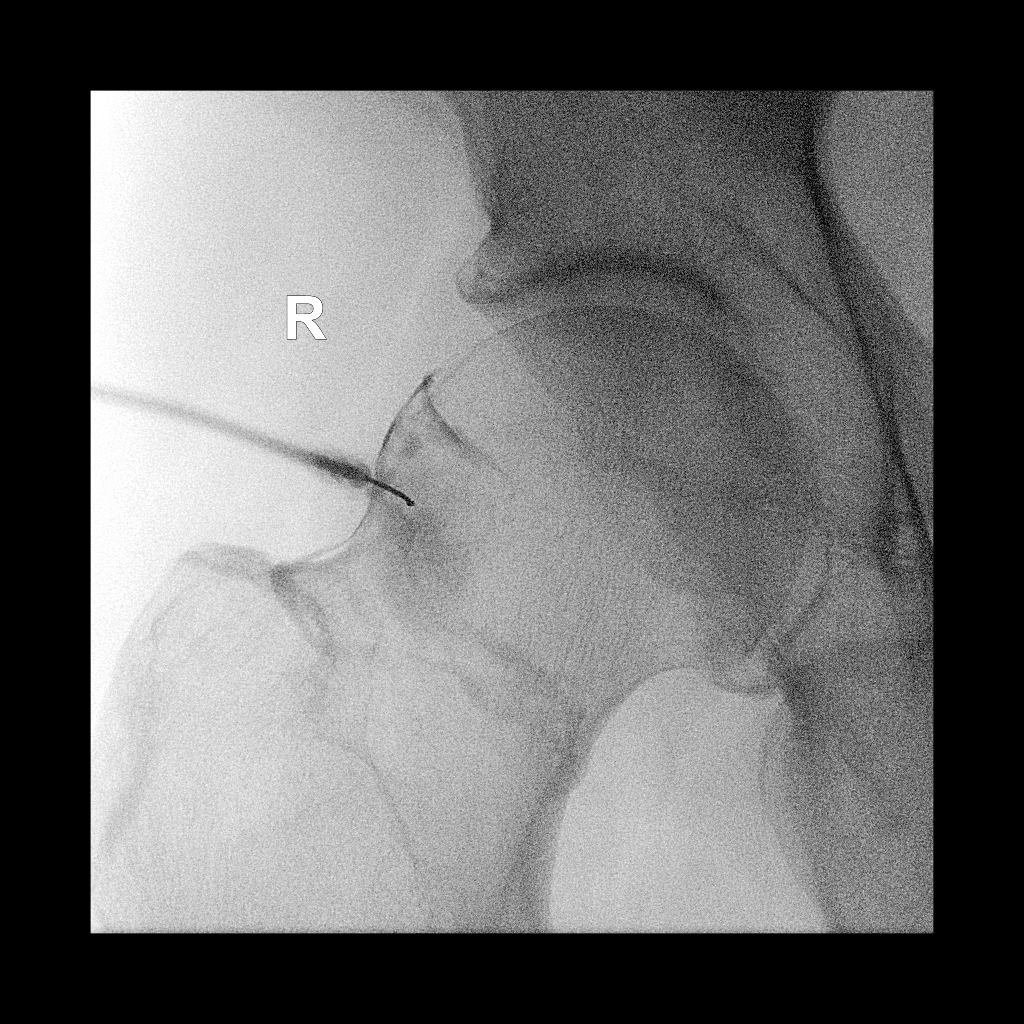

[1 of 1 positions shown; findings below may reference images not displayed]

An appropriate skin entry site was determined under fluoroscopy.
Site was marked, prepped with Betadine, draped in usual sterile
fashion, infiltrated locally with 1% lidocaine. A 22 gauge spinal
needle was advanced to the lateral margin of the femoral head. 1 ml
lidocaine 1% injected easily. Contrast injection of 2 ml
Tmnipaque-XSS showed intraarticular spread without any intravascular
component. 120 mg Depo-Medrol and 5 ml lidocaine 1% was
administered. The patient tolerated procedure well.

COMPLICATIONS:
None immediate
IMPRESSION: 1. Technically successful  right hip injection under fluoroscopy

## 2020-09-02 DIAGNOSIS — M25551 Pain in right hip: Secondary | ICD-10-CM | POA: Diagnosis not present

## 2020-09-02 DIAGNOSIS — M1611 Unilateral primary osteoarthritis, right hip: Secondary | ICD-10-CM | POA: Diagnosis not present

## 2020-09-05 ENCOUNTER — Telehealth: Payer: Self-pay

## 2020-09-05 NOTE — Telephone Encounter (Signed)
pt stated there will be some paperwork faxed over for his hip surgery to filled out from the surgical center.

## 2020-09-14 NOTE — Telephone Encounter (Signed)
Notified pt form was received made surgical clearance appt for 09/29/20.Marland KitchenJohny [name]

## 2020-09-29 ENCOUNTER — Encounter: Payer: Self-pay | Admitting: Internal Medicine

## 2020-09-29 ENCOUNTER — Other Ambulatory Visit: Payer: Self-pay

## 2020-09-29 ENCOUNTER — Other Ambulatory Visit: Payer: Self-pay | Admitting: Internal Medicine

## 2020-09-29 ENCOUNTER — Ambulatory Visit (INDEPENDENT_AMBULATORY_CARE_PROVIDER_SITE_OTHER): Payer: Medicare Other | Admitting: Internal Medicine

## 2020-09-29 VITALS — BP 142/80 | HR 63 | Temp 98.3°F | Ht 71.0 in | Wt 193.6 lb

## 2020-09-29 DIAGNOSIS — I2510 Atherosclerotic heart disease of native coronary artery without angina pectoris: Secondary | ICD-10-CM

## 2020-09-29 DIAGNOSIS — E039 Hypothyroidism, unspecified: Secondary | ICD-10-CM

## 2020-09-29 DIAGNOSIS — I7121 Aneurysm of the ascending aorta, without rupture: Secondary | ICD-10-CM

## 2020-09-29 DIAGNOSIS — T148XXA Other injury of unspecified body region, initial encounter: Secondary | ICD-10-CM | POA: Diagnosis not present

## 2020-09-29 DIAGNOSIS — I471 Supraventricular tachycardia, unspecified: Secondary | ICD-10-CM

## 2020-09-29 DIAGNOSIS — M25559 Pain in unspecified hip: Secondary | ICD-10-CM | POA: Diagnosis not present

## 2020-09-29 DIAGNOSIS — Z01818 Encounter for other preprocedural examination: Secondary | ICD-10-CM | POA: Diagnosis not present

## 2020-09-29 DIAGNOSIS — I712 Thoracic aortic aneurysm, without rupture: Secondary | ICD-10-CM | POA: Diagnosis not present

## 2020-09-29 DIAGNOSIS — R931 Abnormal findings on diagnostic imaging of heart and coronary circulation: Secondary | ICD-10-CM | POA: Diagnosis not present

## 2020-09-29 DIAGNOSIS — E7800 Pure hypercholesterolemia, unspecified: Secondary | ICD-10-CM | POA: Diagnosis not present

## 2020-09-29 DIAGNOSIS — I2583 Coronary atherosclerosis due to lipid rich plaque: Secondary | ICD-10-CM

## 2020-09-29 MED ORDER — FAMOTIDINE 20 MG PO TABS: 20.0000 mg | ORAL_TABLET | Freq: Two times a day (BID) | ORAL | 1 refills | Status: DC

## 2020-09-29 NOTE — Assessment & Plan Note (Addendum)
No no symptoms of angina.  Excellent exercise tolerance.  On Lipitor

## 2020-09-29 NOTE — Assessment & Plan Note (Signed)
Continue with Krill oil, Lipitor
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
## 2020-09-29 NOTE — Assessment & Plan Note (Addendum)
Planning R THR 9/27 Take Pepcid while on NSAIDs

## 2020-09-29 NOTE — Assessment & Plan Note (Signed)
We will continue to monitor periodically.

## 2020-09-29 NOTE — Assessment & Plan Note (Addendum)
Stable.  On Levothyroxine, likely to continue with the same dose. Check TSH preop

## 2020-09-29 NOTE — Assessment & Plan Note (Signed)
No relapse 

## 2020-09-29 NOTE — Assessment & Plan Note (Signed)
Cont w/Lipitor 

## 2020-09-29 NOTE — Assessment & Plan Note (Addendum)
[name] is medically clear for R THR.  Please follow your usual perioperative protocols.  His EKG is normal.  We ordered his chest x-ray and his lab work including INR/PTT.  Thank you!

## 2020-10-11 ENCOUNTER — Telehealth: Payer: Self-pay | Admitting: Internal Medicine

## 2020-10-11 NOTE — Telephone Encounter (Signed)
Patient says he is having surgery procedure done 09.27.22   Would like for Dr. Alain Marion to put in orders to have labs done before procedure   Please follow up w/ patient on when he can have the labs done  Callback # [phone]

## 2020-10-11 NOTE — Telephone Encounter (Signed)
Notified pt MD had already placed labs. He can go to [facility] to have done.Marland KitchenJohny Chess

## 2020-10-12 ENCOUNTER — Other Ambulatory Visit (INDEPENDENT_AMBULATORY_CARE_PROVIDER_SITE_OTHER): Payer: Medicare Other

## 2020-10-12 DIAGNOSIS — I2583 Coronary atherosclerosis due to lipid rich plaque: Secondary | ICD-10-CM | POA: Diagnosis not present

## 2020-10-12 DIAGNOSIS — I471 Supraventricular tachycardia: Secondary | ICD-10-CM

## 2020-10-12 DIAGNOSIS — I2510 Atherosclerotic heart disease of native coronary artery without angina pectoris: Secondary | ICD-10-CM | POA: Diagnosis not present

## 2020-10-12 DIAGNOSIS — T148XXA Other injury of unspecified body region, initial encounter: Secondary | ICD-10-CM | POA: Diagnosis not present

## 2020-10-12 DIAGNOSIS — E039 Hypothyroidism, unspecified: Secondary | ICD-10-CM

## 2020-10-12 DIAGNOSIS — R931 Abnormal findings on diagnostic imaging of heart and coronary circulation: Secondary | ICD-10-CM

## 2020-10-12 DIAGNOSIS — Z01818 Encounter for other preprocedural examination: Secondary | ICD-10-CM | POA: Diagnosis not present

## 2020-10-12 DIAGNOSIS — M25559 Pain in unspecified hip: Secondary | ICD-10-CM | POA: Diagnosis not present

## 2020-10-12 LAB — URINALYSIS
Bilirubin Urine: NEGATIVE
Hgb urine dipstick: NEGATIVE
Ketones, ur: NEGATIVE
Leukocytes,Ua: NEGATIVE
Nitrite: NEGATIVE
Specific Gravity, Urine: <=1.005 — AB (ref 1.000–1.030)
Total Protein, Urine: NEGATIVE
Urine Glucose: NEGATIVE
Urobilinogen, UA: 0.2 (ref 0.0–1.0)
pH: 6 (ref 5.0–8.0)

## 2020-10-12 LAB — CBC WITH DIFFERENTIAL/PLATELET
Basophils Absolute: 0 10*3/uL (ref 0.0–0.1)
Basophils Relative: 0.9 % (ref 0.0–3.0)
Eosinophils Absolute: 0.2 10*3/uL (ref 0.0–0.7)
Eosinophils Relative: 3 % (ref 0.0–5.0)
HCT: 42.9 % (ref 39.0–52.0)
Hemoglobin: 14.6 g/dL (ref 13.0–17.0)
Lymphocytes Relative: 45.6 % (ref 12.0–46.0)
Lymphs Abs: 2.4 10*3/uL (ref 0.7–4.0)
MCHC: 34 g/dL (ref 30.0–36.0)
MCV: 91.1 fl (ref 78.0–100.0)
Monocytes Absolute: 0.6 10*3/uL (ref 0.1–1.0)
Monocytes Relative: 12 % (ref 3.0–12.0)
Neutro Abs: 2 10*3/uL (ref 1.4–7.7)
Neutrophils Relative %: 38.5 % — ABNORMAL LOW (ref 43.0–77.0)
Platelets: 174 10*3/uL (ref 150.0–400.0)
RBC: 4.71 Mil/uL (ref 4.22–5.81)
RDW: 13.1 % (ref 11.5–15.5)
WBC: 5.2 10*3/uL (ref 4.0–10.5)

## 2020-10-12 LAB — T4, FREE: Free T4: 0.7 ng/dL (ref 0.60–1.60)

## 2020-10-12 LAB — COMPREHENSIVE METABOLIC PANEL
ALT: 9 U/L (ref 0–53)
AST: 17 U/L (ref 0–37)
Albumin: 4.5 g/dL (ref 3.5–5.2)
Alkaline Phosphatase: 52 U/L (ref 39–117)
BUN: 15 mg/dL (ref 6–23)
CO2: 24 mEq/L (ref 19–32)
Calcium: 9.6 mg/dL (ref 8.4–10.5)
Chloride: 104 mEq/L (ref 96–112)
Creatinine, Ser: 0.94 mg/dL (ref 0.40–1.50)
GFR: 82.07 mL/min (ref >60.00)
Glucose, Bld: 79 mg/dL (ref 70–99)
Potassium: 4.3 mEq/L (ref 3.5–5.1)
Sodium: 137 mEq/L (ref 135–145)
Total Bilirubin: 0.5 mg/dL (ref 0.2–1.2)
Total Protein: 7.1 g/dL (ref 6.0–8.3)

## 2020-10-12 LAB — TSH: TSH: 3.56 u[IU]/mL (ref 0.35–5.50)

## 2020-10-12 LAB — PROTIME-INR
INR: 1 ratio (ref 0.8–1.0)
Prothrombin Time: 10.8 s (ref 9.6–13.1)

## 2020-10-12 LAB — APTT: aPTT: 24.7 s (ref 23.4–32.7)

## 2020-11-08 DIAGNOSIS — M1611 Unilateral primary osteoarthritis, right hip: Secondary | ICD-10-CM | POA: Diagnosis not present

## 2020-12-12 DIAGNOSIS — Z23 Encounter for immunization: Secondary | ICD-10-CM | POA: Diagnosis not present

## 2020-12-15 DIAGNOSIS — Z471 Aftercare following joint replacement surgery: Secondary | ICD-10-CM | POA: Diagnosis not present

## 2020-12-15 DIAGNOSIS — Z96641 Presence of right artificial hip joint: Secondary | ICD-10-CM | POA: Diagnosis not present

## 2020-12-23 ENCOUNTER — Other Ambulatory Visit: Payer: Self-pay | Admitting: Internal Medicine

## 2020-12-23 DIAGNOSIS — Z85828 Personal history of other malignant neoplasm of skin: Secondary | ICD-10-CM | POA: Diagnosis not present

## 2020-12-23 DIAGNOSIS — L821 Other seborrheic keratosis: Secondary | ICD-10-CM | POA: Diagnosis not present

## 2020-12-23 DIAGNOSIS — L814 Other melanin hyperpigmentation: Secondary | ICD-10-CM | POA: Diagnosis not present

## 2020-12-23 DIAGNOSIS — L570 Actinic keratosis: Secondary | ICD-10-CM | POA: Diagnosis not present

## 2020-12-23 DIAGNOSIS — D225 Melanocytic nevi of trunk: Secondary | ICD-10-CM | POA: Diagnosis not present

## 2020-12-23 DIAGNOSIS — L820 Inflamed seborrheic keratosis: Secondary | ICD-10-CM | POA: Diagnosis not present

## 2020-12-26 DIAGNOSIS — Z23 Encounter for immunization: Secondary | ICD-10-CM | POA: Diagnosis not present

## 2021-01-18 DIAGNOSIS — Z8546 Personal history of malignant neoplasm of prostate: Secondary | ICD-10-CM | POA: Diagnosis not present

## 2021-01-25 ENCOUNTER — Encounter: Payer: Self-pay | Admitting: Internal Medicine

## 2021-01-25 ENCOUNTER — Other Ambulatory Visit: Payer: Self-pay

## 2021-01-25 ENCOUNTER — Ambulatory Visit (INDEPENDENT_AMBULATORY_CARE_PROVIDER_SITE_OTHER): Payer: Medicare Other | Admitting: Internal Medicine

## 2021-01-25 DIAGNOSIS — R931 Abnormal findings on diagnostic imaging of heart and coronary circulation: Secondary | ICD-10-CM | POA: Diagnosis not present

## 2021-01-25 DIAGNOSIS — E039 Hypothyroidism, unspecified: Secondary | ICD-10-CM | POA: Diagnosis not present

## 2021-01-25 DIAGNOSIS — R3912 Poor urinary stream: Secondary | ICD-10-CM | POA: Diagnosis not present

## 2021-01-25 DIAGNOSIS — M25559 Pain in unspecified hip: Secondary | ICD-10-CM

## 2021-01-25 DIAGNOSIS — E7800 Pure hypercholesterolemia, unspecified: Secondary | ICD-10-CM

## 2021-01-25 DIAGNOSIS — Z8546 Personal history of malignant neoplasm of prostate: Secondary | ICD-10-CM | POA: Diagnosis not present

## 2021-01-25 DIAGNOSIS — N5201 Erectile dysfunction due to arterial insufficiency: Secondary | ICD-10-CM | POA: Diagnosis not present

## 2021-01-25 DIAGNOSIS — N401 Enlarged prostate with lower urinary tract symptoms: Secondary | ICD-10-CM | POA: Diagnosis not present

## 2021-01-25 DIAGNOSIS — C61 Malignant neoplasm of prostate: Secondary | ICD-10-CM | POA: Diagnosis not present

## 2021-01-25 DIAGNOSIS — R351 Nocturia: Secondary | ICD-10-CM | POA: Diagnosis not present

## 2021-01-25 LAB — COMPREHENSIVE METABOLIC PANEL
ALT: 10 U/L (ref 0–53)
AST: 18 U/L (ref 0–37)
Albumin: 4.5 g/dL (ref 3.5–5.2)
Alkaline Phosphatase: 64 U/L (ref 39–117)
BUN: 18 mg/dL (ref 6–23)
CO2: 27 mEq/L (ref 19–32)
Calcium: 9.8 mg/dL (ref 8.4–10.5)
Chloride: 102 mEq/L (ref 96–112)
Creatinine, Ser: 0.95 mg/dL (ref 0.40–1.50)
GFR: 80.87 mL/min (ref >60.00)
Glucose, Bld: 95 mg/dL (ref 70–99)
Potassium: 4.2 mEq/L (ref 3.5–5.1)
Sodium: 137 mEq/L (ref 135–145)
Total Bilirubin: 0.8 mg/dL (ref 0.2–1.2)
Total Protein: 7.5 g/dL (ref 6.0–8.3)

## 2021-01-25 LAB — TSH: TSH: 2.7 u[IU]/mL (ref 0.35–5.50)

## 2021-01-25 LAB — LIPID PANEL
Cholesterol: 170 mg/dL (ref 0–200)
HDL: 57.3 mg/dL (ref >39.00)
LDL Cholesterol: 94 mg/dL (ref 0–99)
NonHDL: 112.34
Total CHOL/HDL Ratio: 3
Triglycerides: 93 mg/dL (ref 0.0–149.0)
VLDL: 18.6 mg/dL (ref 0.0–40.0)

## 2021-01-25 NOTE — Assessment & Plan Note (Signed)
S/p R THR - doing well

## 2021-01-25 NOTE — Assessment & Plan Note (Signed)
S/p XRT 

## 2021-01-25 NOTE — Progress Notes (Signed)
Subjective:  Patient ID: Jacob [name], male    DOB: 1950/08/19  Age: 70 y.o. MRN: [id number]  CC: Follow-up (6 month f/u)   HPI Kelcey Wickstrom presents for hypothyroidism, dyslipidemia, OA f/u  Outpatient Medications Prior to Visit  Medication Sig Dispense Refill   atorvastatin (LIPITOR) 10 MG tablet Take 1 tablet (10 mg total) by mouth daily. 30 tablet 11   cholecalciferol (VITAMIN D) 1000 UNITS tablet Take 1,000 Units by mouth every evening.      famotidine (PEPCID) 20 MG tablet TAKE 1 TABLET(20 MG) BY MOUTH TWICE DAILY 180 tablet 3   glucosamine-chondroitin 500-400 MG tablet Take 2 tablets by mouth every evening.     KRILL OIL PO One daily     L-Lysine 500 MG TABS Take by mouth.     levothyroxine (SYNTHROID) 50 MCG tablet TAKE 1 TABLET BY MOUTH EVERY DAY 90 tablet 2   Multiple Vitamins-Minerals (MULTIVITAL PO) Take 1 tablet by mouth every evening.     OVER THE COUNTER MEDICATION Take 500 mg by mouth every evening. Lysin tab once daily     amoxicillin (AMOXIL) 500 MG tablet TK 4 TS PO 1 HOUR PRIOR TO TREATMENT (Patient not taking: Reported on 07/26/2020)  0   No facility-administered medications prior to visit.    ROS: Review of Systems  Constitutional:  Negative for appetite change, fatigue and unexpected weight change.  HENT:  Negative for congestion, nosebleeds, sneezing, sore throat and trouble swallowing.   Eyes:  Negative for itching and visual disturbance.  Respiratory:  Negative for cough.   Cardiovascular:  Negative for chest pain, palpitations and leg swelling.  Gastrointestinal:  Negative for abdominal distention, blood in stool, diarrhea and nausea.  Genitourinary:  Negative for frequency and hematuria.  Musculoskeletal:  Negative for back pain, gait problem, joint swelling and neck pain.  Skin:  Negative for rash.  Neurological:  Negative for dizziness, tremors, speech difficulty and weakness.  Psychiatric/Behavioral:  Negative for agitation, dysphoric  mood and sleep disturbance. The patient is not nervous/anxious.    Objective:  BP 110/68 (BP Location: Left Arm)    Pulse 61    Temp 98.3 F (36.8 C) (Oral)    Ht 5\' 11"  (1.803 m)    Wt 194 lb 12.8 oz (88.4 kg)    SpO2 99%    BMI 27.17 kg/m   BP Readings from Last 3 Encounters:  01/25/21 110/68  09/29/20 (!) 142/80  07/26/20 130/78    Wt Readings from Last 3 Encounters:  01/25/21 194 lb 12.8 oz (88.4 kg)  09/29/20 193 lb 9.6 oz (87.8 kg)  07/26/20 193 lb 3.2 oz (87.6 kg)    Physical Exam Constitutional:      General: He is not in acute distress.    Appearance: He is well-developed.     Comments: NAD  Eyes:     Conjunctiva/sclera: Conjunctivae normal.     Pupils: Pupils are equal, round, and reactive to light.  Neck:     Thyroid: No thyromegaly.     Vascular: No JVD.  Cardiovascular:     Rate and Rhythm: Normal rate and regular rhythm.     Heart sounds: Normal heart sounds. No murmur heard.   No friction rub. No gallop.  Pulmonary:     Effort: Pulmonary effort is normal. No respiratory distress.     Breath sounds: Normal breath sounds. No wheezing or rales.  Chest:     Chest wall: No tenderness.  Abdominal:     General:  Bowel sounds are normal. There is no distension.     Palpations: Abdomen is soft. There is no mass.     Tenderness: There is no abdominal tenderness. There is no guarding or rebound.  Musculoskeletal:        General: No tenderness. Normal range of motion.     Cervical back: Normal range of motion.  Lymphadenopathy:     Cervical: No cervical adenopathy.  Skin:    General: Skin is warm and dry.     Findings: No rash.  Neurological:     Mental Status: He is alert and oriented to person, place, and time.     Cranial Nerves: No cranial nerve deficit.     Motor: No abnormal muscle tone.     Coordination: Coordination normal.     Gait: Gait normal.     Deep Tendon Reflexes: Reflexes are normal and symmetric.  Psychiatric:        Behavior: Behavior  normal.        Thought Content: Thought content normal.        Judgment: Judgment normal.    Lab Results  Component Value Date   WBC 5.2 10/12/2020   HGB 14.6 10/12/2020   HCT 42.9 10/12/2020   PLT 174.0 10/12/2020   GLUCOSE 79 10/12/2020   CHOL 168 07/26/2020   TRIG 91.0 07/26/2020   HDL 54.90 07/26/2020   LDLDIRECT 126.2 10/24/2011   LDLCALC 95 07/26/2020   ALT 9 10/12/2020   AST 17 10/12/2020   NA 137 10/12/2020   K 4.3 10/12/2020   CL 104 10/12/2020   CREATININE 0.94 10/12/2020   BUN 15 10/12/2020   CO2 24 10/12/2020   TSH 3.56 10/12/2020   PSA 4.45 (H) 10/31/2015   INR 1.0 10/12/2020   HGBA1C 5.6 04/29/2013    DL FLUORO GUIDED NEEDLE PLC ASPIRATION / INJECTTION/LOC  Result Date: 01/14/2018 CLINICAL DATA:  Post traumatic arthritis, pain. EXAM: RIGHT HIP INJECTION UNDER FLUOROSCOPY FLUOROSCOPY TIME:  5 seconds; 8 uGym2 DAP TECHNIQUE: The procedure, risks (including but not limited to bleeding, infection, organ damage ), benefits, and alternatives were explained to the patient. Questions regarding the procedure were encouraged and answered. The patient understands and consents to the procedure. An appropriate skin entry site was determined under fluoroscopy. Site was marked, prepped with Betadine, draped in usual sterile fashion, infiltrated locally with 1% lidocaine. A 22 gauge spinal needle was advanced to the lateral margin of the femoral head. 1 ml lidocaine 1% injected easily. Contrast injection of 2 ml Omnipaque-300 showed intraarticular spread without any intravascular component. 120 mg Depo-Medrol and 5 ml lidocaine 1% was administered. The patient tolerated procedure well. COMPLICATIONS: None immediate IMPRESSION: 1. Technically successful  right hip injection under fluoroscopy Electronically Signed   By: Lucrezia Europe M.D.   On: 01/14/2018 11:21    Assessment & Plan:   Problem List Items Addressed This Visit     Hip pain    S/p R THR - doing well       Hypercholesterolemia    Cont on Lipitor Check lipids      Relevant Orders   TSH   Comprehensive metabolic panel   Lipid panel   Hypothyroidism    On Levothyroxine      Relevant Orders   Comprehensive metabolic panel   Prostate cancer ([place])    S/p XRT         No orders of the defined types were placed in this encounter.     Follow-up:  Return in about 6 months (around 07/26/2021) for Wellness Exam.  Walker Kehr, MD

## 2021-01-25 NOTE — Assessment & Plan Note (Signed)
Cont on Lipitor Check lipids

## 2021-01-25 NOTE — Assessment & Plan Note (Signed)
On Levothyroxine 

## 2021-04-19 ENCOUNTER — Ambulatory Visit (INDEPENDENT_AMBULATORY_CARE_PROVIDER_SITE_OTHER): Payer: Medicare Other

## 2021-04-19 ENCOUNTER — Other Ambulatory Visit: Payer: Self-pay

## 2021-04-19 VITALS — BP 110/70 | HR 62 | Temp 98.1°F | Resp 16 | Ht 71.0 in | Wt 193.8 lb

## 2021-04-19 DIAGNOSIS — Z0000 Encounter for general adult medical examination without abnormal findings: Secondary | ICD-10-CM

## 2021-04-19 NOTE — Patient Instructions (Signed)
[name] , Thank you for taking time to come for your Medicare Wellness Visit. I appreciate your ongoing commitment to your health goals. Please review the following plan we discussed and let me know if I can assist you in the future.   Screening recommendations/referrals: Colonoscopy: 09/17/2018; due every 5 years  Recommended yearly ophthalmology/optometry visit for glaucoma screening and checkup Recommended yearly dental visit for hygiene and checkup  Vaccinations: Influenza vaccine: 11/12/2020 Pneumococcal vaccine: 11/01/2015, 12/12/2016 Tdap vaccine: 03/14/2011; due every 10 years; not covered by Medicare as preventative but will cover as treatment for an injury Shingles vaccine: never done   Covid-19:03/20/2019, 04/18/2019, 01/01/2020, 12/26/2020  Advanced directives: Please schedule your next Medicare Wellness Visit with your Nurse Health Advisor in 1 year by calling [phone].  Conditions/risks identified: Yes; Client understands the importance of follow-up appointments with providers by attending scheduled visits and discussed goals to eat healthier, increase physical activity 5 times a week for 30 minutes each, exercise the brain by doing stimulating brain exercises (reading, adult coloring, crafting, listening to music, puzzles, etc.), socialize and enjoy life more, get enough sleep at least 8-9 hours average per night and make time for laughter.  Next appointment: Please schedule your next Medicare Wellness Visit with your Nurse Health Advisor in 1 year by calling [phone].  Preventive Care 71 Years and Older, Male Preventive care refers to lifestyle choices and visits with your health care provider that can promote health and wellness. What does preventive care include? A yearly physical exam. This is also called an annual well check. Dental exams once or twice a year. Routine eye exams. Ask your health care provider how often you should have your eyes checked. Personal  lifestyle choices, including: Daily care of your teeth and gums. Regular physical activity. Eating a healthy diet. Avoiding tobacco and drug use. Limiting alcohol use. Practicing safe sex. Taking low doses of aspirin every day. Taking vitamin and mineral supplements as recommended by your health care provider. What happens during an annual well check? The services and screenings done by your health care provider during your annual well check will depend on your age, overall health, lifestyle risk factors, and family history of disease. Counseling  Your health care provider may ask you questions about your: Alcohol use. Tobacco use. Drug use. Emotional well-being. Home and relationship well-being. Sexual activity. Eating habits. History of falls. Memory and ability to understand (cognition). Work and work Statistician. Screening  You may have the following tests or measurements: Height, weight, and BMI. Blood pressure. Lipid and cholesterol levels. These may be checked every 5 years, or more frequently if you are over 62 years old. Skin check. Lung cancer screening. You may have this screening every year starting at age 42 if you have a 30-pack-year history of smoking and currently smoke or have quit within the past 15 years. Fecal occult blood test (FOBT) of the stool. You may have this test every year starting at age 100. Flexible sigmoidoscopy or colonoscopy. You may have a sigmoidoscopy every 5 years or a colonoscopy every 10 years starting at age 7. Prostate cancer screening. Recommendations will vary depending on your family history and other risks. Hepatitis C blood test. Hepatitis B blood test. Sexually transmitted disease (STD) testing. Diabetes screening. This is done by checking your blood sugar (glucose) after you have not eaten for a while (fasting). You may have this done every 1-3 years. Abdominal aortic aneurysm (AAA) screening. You may need this if you are a  current or former smoker. Osteoporosis. You may be screened starting at age 55 if you are at high risk. Talk with your health care provider about your test results, treatment options, and if necessary, the need for more tests. Vaccines  Your health care provider may recommend certain vaccines, such as: Influenza vaccine. This is recommended every year. Tetanus, diphtheria, and acellular pertussis (Tdap, Td) vaccine. You may need a Td booster every 10 years. Zoster vaccine. You may need this after age 78. Pneumococcal 13-valent conjugate (PCV13) vaccine. One dose is recommended after age 26. Pneumococcal polysaccharide (PPSV23) vaccine. One dose is recommended after age 37. Talk to your health care provider about which screenings and vaccines you need and how often you need them. This information is not intended to replace advice given to you by your health care provider. Make sure you discuss any questions you have with your health care provider. Document Released: 02/25/2015 Document Revised: 10/19/2015 Document Reviewed: 11/30/2014 Elsevier Interactive Patient Education  2017 [place] Prevention in the Home Falls can cause injuries. They can happen to people of all ages. There are many things you can do to make your home safe and to help prevent falls. What can I do on the outside of my home? Regularly fix the edges of walkways and driveways and fix any cracks. Remove anything that might make you trip as you walk through a door, such as a raised step or threshold. Trim any bushes or trees on the path to your home. Use bright outdoor lighting. Clear any walking paths of anything that might make someone trip, such as rocks or tools. Regularly check to see if handrails are loose or broken. Make sure that both sides of any steps have handrails. Any raised decks and porches should have guardrails on the edges. Have any leaves, snow, or ice cleared regularly. Use sand or salt on  walking paths during winter. Clean up any spills in your garage right away. This includes oil or grease spills. What can I do in the bathroom? Use night lights. Install grab bars by the toilet and in the tub and shower. Do not use towel bars as grab bars. Use non-skid mats or decals in the tub or shower. If you need to sit down in the shower, use a plastic, non-slip stool. Keep the floor dry. Clean up any water that spills on the floor as soon as it happens. Remove soap buildup in the tub or shower regularly. Attach bath mats securely with double-sided non-slip rug tape. Do not have throw rugs and other things on the floor that can make you trip. What can I do in the bedroom? Use night lights. Make sure that you have a light by your bed that is easy to reach. Do not use any sheets or blankets that are too big for your bed. They should not hang down onto the floor. Have a firm chair that has side arms. You can use this for support while you get dressed. Do not have throw rugs and other things on the floor that can make you trip. What can I do in the kitchen? Clean up any spills right away. Avoid walking on wet floors. Keep items that you use a lot in easy-to-reach places. If you need to reach something above you, use a strong step stool that has a grab bar. Keep electrical cords out of the way. Do not use floor polish or wax that makes floors slippery. If you must use wax,  use non-skid floor wax. Do not have throw rugs and other things on the floor that can make you trip. What can I do with my stairs? Do not leave any items on the stairs. Make sure that there are handrails on both sides of the stairs and use them. Fix handrails that are broken or loose. Make sure that handrails are as long as the stairways. Check any carpeting to make sure that it is firmly attached to the stairs. Fix any carpet that is loose or worn. Avoid having throw rugs at the top or bottom of the stairs. If you do  have throw rugs, attach them to the floor with carpet tape. Make sure that you have a light switch at the top of the stairs and the bottom of the stairs. If you do not have them, ask someone to add them for you. What else can I do to help prevent falls? Wear shoes that: Do not have high heels. Have rubber bottoms. Are comfortable and fit you well. Are closed at the toe. Do not wear sandals. If you use a stepladder: Make sure that it is fully opened. Do not climb a closed stepladder. Make sure that both sides of the stepladder are locked into place. Ask someone to hold it for you, if possible. Clearly mark and make sure that you can see: Any grab bars or handrails. First and last steps. Where the edge of each step is. Use tools that help you move around (mobility aids) if they are needed. These include: Canes. Walkers. Scooters. Crutches. Turn on the lights when you go into a dark area. Replace any light bulbs as soon as they burn out. Set up your furniture so you have a clear path. Avoid moving your furniture around. If any of your floors are uneven, fix them. If there are any pets around you, be aware of where they are. Review your medicines with your doctor. Some medicines can make you feel dizzy. This can increase your chance of falling. Ask your doctor what other things that you can do to help prevent falls. This information is not intended to replace advice given to you by your health care provider. Make sure you discuss any questions you have with your health care provider. Document Released: 11/25/2008 Document Revised: 07/07/2015 Document Reviewed: 03/05/2014 Elsevier Interactive Patient Education  2017 [employer].

## 2021-04-19 NOTE — Progress Notes (Cosign Needed)
Subjective:   Jacob [name] is a 71 y.o. male who presents for Medicare Annual/Subsequent preventive examination.  Review of Systems     Cardiac Risk Factors include: advanced age (>104mn, >>60women);dyslipidemia;family history of premature cardiovascular disease;male gender     Objective:    Today's Vitals   04/19/21 1002  Height: '5\' 11"'$  (1.803 m)   Body mass index is 27.17 kg/m.  Advanced Directives 04/18/2020 12/12/2016 09/21/2016 08/30/2016 08/24/2013  Does Patient Have a Medical Advance Directive? Yes Yes Yes Yes Patient has advance directive, copy not in chart  Type of Advance Directive Living will;Healthcare Power of [place] will [place] LakesLiving will [place]Living will [place]Living will  Does patient want to make changes to medical advance directive? No - Patient declined - - - -  Copy of [place] Chart? No - copy requested No - copy requested No - copy requested No - copy requested -    Current Medications (verified) Outpatient Encounter Medications as of 04/19/2021  Medication Sig   amoxicillin (AMOXIL) 500 MG tablet TK 4 TS PO 1 HOUR PRIOR TO TREATMENT (Patient not taking: Reported on 07/26/2020)   atorvastatin (LIPITOR) 10 MG tablet Take 1 tablet (10 mg total) by mouth daily.   cholecalciferol (VITAMIN D) 1000 UNITS tablet Take 1,000 Units by mouth every evening.    famotidine (PEPCID) 20 MG tablet TAKE 1 TABLET(20 MG) BY MOUTH TWICE DAILY   glucosamine-chondroitin 500-400 MG tablet Take 2 tablets by mouth every evening.   KRILL OIL PO One daily   L-Lysine 500 MG TABS Take by mouth.   levothyroxine (SYNTHROID) 50 MCG tablet TAKE 1 TABLET BY MOUTH EVERY DAY   Multiple Vitamins-Minerals (MULTIVITAL PO) Take 1 tablet by mouth every evening.   OVER THE COUNTER MEDICATION Take 500 mg by mouth every evening. Lysin tab once daily   No  facility-administered encounter medications on file as of 04/19/2021.    Allergies (verified) Pneumovax [pneumococcal polysaccharide vaccine]   History: Past Medical History:  Diagnosis Date   Anemia due to blood loss, acute 12/11/2010   10/12 post polypectomy lower GI bleed; Dr BGlennon Hamilton   Eczema    hot tub related   GI bleed    s/p polypectomy   Herpes zoster 2010   Hx of colonic polyps 12/11/2010   Last colon 2012 Dr GCarlean Purl  Prostate cancer ([facility]    SVT [facility]supraventricular tachycardia) ([facility]    Past Surgical History:  Procedure Laterality Date   APPENDECTOMY  05/2010   COLONOSCOPY W/ BIOPSIES     CYSTOSCOPY N/A 08/30/2016   Procedure: CYSTOSCOPY FLEXIBLE;  Surgeon: WIrine Seal MD;  [facility]  Service: Urology;  Laterality: N/A;  no seeds found in bladder   ELECTROPHYSIOLOGY STUDY  08/24/13   EPS by Dr TLovena Lewith no inducible SVT or evidence of dual AV nodal physiology   PROSTATE BIOPSY     RADIOACTIVE SEED IMPLANT N/A 08/30/2016   Procedure: RADIOACTIVE SEED IMPLANT/BRACHYTHERAPY IMPLANT WITH SPACE OAR;  Surgeon: WIrine Seal MD;  [facility]  Service: Urology;  Laterality: N/A;    62   seeds implanted   ROTATOR CUFF REPAIR  2003   right   SUPRAVENTRICULAR TACHYCARDIA ABLATION N/A 08/24/2013   Procedure: SUPRAVENTRICULAR TACHYCARDIA ABLATION;  Surgeon: GEvans Lance MD;  [facility];  Service: Cardiovascular;  Laterality: N/A;   Family History  Problem  Relation Age of Onset   Heart disease Mother        pacemaker   Cancer Mother        lung/smoker/passed at age 72   Heart attack Father    Cancer Paternal Uncle        pancreatic   Social History   Socioeconomic History   Marital status: Married    Spouse name: Not on file   Number of children: 2   Years of education: Not on file   Highest education level: Not on file  Occupational History   Occupation: retired  Tobacco Use   Smoking status: Never     Passive exposure: Yes   Smokeless tobacco: Never   Tobacco comments:    occasional  Vaping Use   Vaping Use: Never used  Substance and Sexual Activity   Alcohol use: Yes    Comment: rare-weekend drinker   Drug use: No   Sexual activity: Yes  Other Topics Concern   Not on file  Social History Narrative   Married, 2 kids and + grandchildren   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   Regular exercise- yes, cycling   Social Determinants of Radio broadcast assistant Strain: Low Risk    Difficulty of Paying Living Expenses: Not hard at all  Food Insecurity: No Food Insecurity   Worried About Charity fundraiser in the Last Year: Never true   Arboriculturist in the Last Year: Never true  Transportation Needs: No Transportation Needs   Lack of Transportation (Medical): No   Lack of Transportation (Non-Medical): No  Physical Activity: Sufficiently Active   Days of Exercise per Week: 5 days   Minutes of Exercise per Session: 30 min  Stress: No Stress Concern Present   Feeling of Stress : Not at all  Social Connections: Socially Integrated   Frequency of Communication with Friends and Family: More than three times a week   Frequency of Social Gatherings with Friends and Family: More than three times a week   Attends Religious Services: More than 4 times per year   Active Member of [employer] or Organizations: Yes   Attends Music therapist: More than 4 times per year   Marital Status: Married    Tobacco Counseling Counseling given: Not Answered Tobacco comments: occasional   Clinical Intake:  Pre-visit preparation completed: Yes  Pain : No/denies pain     Nutritional Risks: None Diabetes: No  How often do you need to have someone help you when you read instructions, pamphlets, or other written materials from your doctor or pharmacy?: 1 - Never  Diabetic? no  Interpreter Needed?: No  Information entered by :: Lisette Abu,  LPN   Activities of Daily Living In your present state of health, do you have any difficulty performing the following activities: 04/19/2021  Hearing? N  Vision? N  Difficulty concentrating or making decisions? N  Walking or climbing stairs? N  Dressing or bathing? N  Doing errands, shopping? N  Preparing Food and eating ? N  Using the Toilet? N  In the past six months, have you accidently leaked urine? N  Do you have problems with loss of bowel control? N  Managing your Medications? N  Managing your Finances? N  Housekeeping or managing your Housekeeping? N  Some recent data might be hidden    Patient Care Team: Plotnikov, Evie Lacks, MD as PCP - General Rosana Berger, MD as Referring Physician (  Gastroenterology) Tyler Pita, MD as Consulting Physician (Radiation Oncology) Irine Seal, MD as Attending Physician (Urology) Nahser, Wonda Cheng, MD as Consulting Physician (Cardiology) Evans Lance, MD as Consulting Physician (Cardiology) Sanda Klein, MD as Consulting Physician (Cardiology) Gaynelle Arabian, MD as Consulting Physician (Orthopedic Surgery)  Indicate any recent Medical Services you may have received from other than [facility] providers in the past year (date may be approximate).     Assessment:   This is a routine wellness examination for Jacob [name].  Hearing/Vision screen Hearing Screening - Comments:: Patient denied any hearing difficulty.   No hearing aids.  Vision Screening - Comments:: Patient does wear corrective lenses/contacts.   Eye exam done by: Dr. Rutherford Guys  Dietary issues and exercise activities discussed: Current Exercise Habits: Home exercise routine;Structured exercise class, Type of exercise: walking;Other - see comments (cycling), Time (Minutes): 30, Frequency (Times/Week): 5, Weekly Exercise (Minutes/Week): 150, Intensity: Moderate, Exercise limited by: None identified   Goals Addressed   None   Depression Screen PHQ 2/9 Scores  04/19/2021 04/18/2020 01/15/2019 12/12/2016 09/21/2016 06/04/2016 11/01/2015  PHQ - 2 Score 0 0 0 0 0 0 0  PHQ- 9 Score - - - 0 - - -    Fall Risk Fall Risk  04/19/2021 04/18/2020 12/30/2017 12/12/2016 09/21/2016  Falls in the past year? 0 0 0 No No  Comment - - Emmi Telephone Survey: data to providers prior to load - -  Number falls in past yr: 0 0 - - -  Injury with Fall? 0 0 - - -  Risk for fall due to : No Fall Risks No Fall Risks - - -  Follow up Falls evaluation completed Falls evaluation completed - - -    FALL RISK PREVENTION PERTAINING TO THE HOME:  Any stairs in or around the home? Yes  If so, are there any without handrails? No  Home free of loose throw rugs in walkways, pet beds, electrical cords, etc? Yes  Adequate lighting in your home to reduce risk of falls? Yes   ASSISTIVE DEVICES UTILIZED TO PREVENT FALLS:  Life alert? No  Use of a cane, walker or w/c? No  Grab bars in the bathroom? No  Shower chair or bench in shower? Yes  Elevated toilet seat or a handicapped toilet? Yes   TIMED UP AND GO:  Was the test performed? Yes .  Length of time to ambulate 10 feet: 6 sec.   Gait steady and fast without use of assistive device  Cognitive Function: Normal cognitive status assessed by direct observation by this Nurse Health Advisor. No abnormalities found.          Immunizations Immunization History  Administered Date(s) Administered   Fluad Quad(high Dose 65+) 12/29/2019   Influenza Split 10/30/2011   Influenza Whole 12/01/2003, 11/11/2007, 11/23/2008, 11/13/2010   Influenza, High Dose Seasonal PF 12/12/2016, 12/04/2018, 11/12/2020   Influenza,inj,Quad PF,6+ Mos 11/01/2015   Influenza-Unspecified 11/12/2013, 12/13/2017   Moderna SARS-COV2 Booster Vaccination 01/01/2020   Moderna Sars-Covid-2 Vaccination 03/20/2019, 04/18/2019   Pfizer Covid-19 Vaccine Bivalent Booster 83yr & up 12/26/2020   Pneumococcal Conjugate-13 11/01/2015   Pneumococcal Polysaccharide-23  12/12/2016   Tdap 03/14/2011   Zoster, Live 04/28/2013    TDAP status: Due, Education has been provided regarding the importance of this vaccine. Advised may receive this vaccine at local pharmacy or Health Dept. Aware to provide a copy of the vaccination record if obtained from local pharmacy or Health Dept. Verbalized acceptance and understanding.  Flu Vaccine status: Up  to date  Pneumococcal vaccine status: Up to date  Covid-19 vaccine status: Completed vaccines  Qualifies for Shingles Vaccine? Yes   Zostavax completed Yes   Shingrix Completed?: No.    Education has been provided regarding the importance of this vaccine. Patient has been advised to call insurance company to determine out of pocket expense if they have not yet received this vaccine. Advised may also receive vaccine at local pharmacy or Health Dept. Verbalized acceptance and understanding.  Screening Tests Health Maintenance  Topic Date Due   Hepatitis C Screening  Never done   Zoster Vaccines- Shingrix (1 of 2) Never done   COVID-19 Vaccine (4 - Booster) 02/20/2021   TETANUS/TDAP  03/13/2021   COLONOSCOPY (Pts 45-45yr Insurance coverage will need to be confirmed)  09/17/2023   Pneumonia Vaccine 71 Years old  Completed   INFLUENZA VACCINE  Completed   HPV VACCINES  Aged Out    Health Maintenance  Health Maintenance Due  Topic Date Due   Hepatitis C Screening  Never done   Zoster Vaccines- Shingrix (1 of 2) Never done   COVID-19 Vaccine (4 - Booster) 02/20/2021   TETANUS/TDAP  03/13/2021    Colorectal cancer screening: Type of screening: Colonoscopy. Completed 09/17/2018. Repeat every 5 years  Lung Cancer Screening: (Low Dose CT Chest recommended if Age 260-80years, 30 pack-year currently smoking OR have quit w/in 15years.) does not qualify.   Lung Cancer Screening Referral: no  Additional Screening:  Hepatitis C Screening: does qualify; Completed no  Vision Screening: Recommended annual  ophthalmology exams for early detection of glaucoma and other disorders of the eye. Is the patient up to date with their annual eye exam?  Yes  Who is the provider or what is the name of the office in which the patient attends annual eye exams? MRutherford Guys MD. If pt is not established with a provider, would they like to be referred to a provider to establish care? No .   Dental Screening: Recommended annual dental exams for proper oral hygiene  Community Resource Referral / Chronic Care Management: CRR required this visit?  No   CCM required this visit?  No      Plan:     I have personally reviewed and noted the following in the patients chart:   Medical and social history Use of alcohol, tobacco or illicit drugs  Current medications and supplements including opioid prescriptions. Patient is not currently taking opioid prescriptions. Functional ability and status Nutritional status Physical activity Advanced directives List of other physicians Hospitalizations, surgeries, and ER visits in previous 12 months Vitals Screenings to include cognitive, depression, and falls Referrals and appointments  In addition, I have reviewed and discussed with patient certain preventive protocols, quality metrics, and best practice recommendations. A written personalized care plan for preventive services as well as general preventive health recommendations were provided to patient.     SSheral Flow LPN   [id number]/05/[id number]  Nurse Notes:  Hearing Screening - Comments:: Patient denied any hearing difficulty.   No hearing aids.  Vision Screening - Comments:: Patient does wear corrective lenses/contacts.   Eye exam done by: Dr. MRutherford Guys

## 2021-05-02 DIAGNOSIS — H25013 Cortical age-related cataract, bilateral: Secondary | ICD-10-CM | POA: Diagnosis not present

## 2021-05-02 DIAGNOSIS — H2513 Age-related nuclear cataract, bilateral: Secondary | ICD-10-CM | POA: Diagnosis not present

## 2021-07-26 ENCOUNTER — Ambulatory Visit (INDEPENDENT_AMBULATORY_CARE_PROVIDER_SITE_OTHER): Payer: Medicare Other | Admitting: Internal Medicine

## 2021-07-26 ENCOUNTER — Encounter: Payer: Self-pay | Admitting: Internal Medicine

## 2021-07-26 VITALS — BP 118/78 | HR 54 | Temp 97.7°F | Ht 71.0 in | Wt 187.0 lb

## 2021-07-26 DIAGNOSIS — M1990 Unspecified osteoarthritis, unspecified site: Secondary | ICD-10-CM | POA: Diagnosis not present

## 2021-07-26 DIAGNOSIS — I2583 Coronary atherosclerosis due to lipid rich plaque: Secondary | ICD-10-CM | POA: Diagnosis not present

## 2021-07-26 DIAGNOSIS — C61 Malignant neoplasm of prostate: Secondary | ICD-10-CM

## 2021-07-26 DIAGNOSIS — E039 Hypothyroidism, unspecified: Secondary | ICD-10-CM

## 2021-07-26 DIAGNOSIS — Z8546 Personal history of malignant neoplasm of prostate: Secondary | ICD-10-CM | POA: Diagnosis not present

## 2021-07-26 DIAGNOSIS — I2510 Atherosclerotic heart disease of native coronary artery without angina pectoris: Secondary | ICD-10-CM

## 2021-07-26 DIAGNOSIS — R931 Abnormal findings on diagnostic imaging of heart and coronary circulation: Secondary | ICD-10-CM

## 2021-07-26 LAB — CBC WITH DIFFERENTIAL/PLATELET
Basophils Absolute: 0.1 10*3/uL (ref 0.0–0.1)
Basophils Relative: 1.1 % (ref 0.0–3.0)
Eosinophils Absolute: 0.1 10*3/uL (ref 0.0–0.7)
Eosinophils Relative: 2 % (ref 0.0–5.0)
HCT: 43.5 % (ref 39.0–52.0)
Hemoglobin: 14.5 g/dL (ref 13.0–17.0)
Lymphocytes Relative: 47.4 % — ABNORMAL HIGH (ref 12.0–46.0)
Lymphs Abs: 2.5 10*3/uL (ref 0.7–4.0)
MCHC: 33.3 g/dL (ref 30.0–36.0)
MCV: 92.7 fl (ref 78.0–100.0)
Monocytes Absolute: 0.6 10*3/uL (ref 0.1–1.0)
Monocytes Relative: 10.8 % (ref 3.0–12.0)
Neutro Abs: 2 10*3/uL (ref 1.4–7.7)
Neutrophils Relative %: 38.7 % — ABNORMAL LOW (ref 43.0–77.0)
Platelets: 182 10*3/uL (ref 150.0–400.0)
RBC: 4.7 Mil/uL (ref 4.22–5.81)
RDW: 13.3 % (ref 11.5–15.5)
WBC: 5.3 10*3/uL (ref 4.0–10.5)

## 2021-07-26 LAB — LIPID PANEL
Cholesterol: 158 mg/dL (ref 0–200)
HDL: 59 mg/dL (ref >39.00)
LDL Cholesterol: 80 mg/dL (ref 0–99)
NonHDL: 99.46
Total CHOL/HDL Ratio: 3
Triglycerides: 99 mg/dL (ref 0.0–149.0)
VLDL: 19.8 mg/dL (ref 0.0–40.0)

## 2021-07-26 LAB — TSH: TSH: 2.88 u[IU]/mL (ref 0.35–5.50)

## 2021-07-26 LAB — COMPREHENSIVE METABOLIC PANEL
ALT: 10 U/L (ref 0–53)
AST: 19 U/L (ref 0–37)
Albumin: 4.5 g/dL (ref 3.5–5.2)
Alkaline Phosphatase: 46 U/L (ref 39–117)
BUN: 15 mg/dL (ref 6–23)
CO2: 26 mEq/L (ref 19–32)
Calcium: 9.4 mg/dL (ref 8.4–10.5)
Chloride: 104 mEq/L (ref 96–112)
Creatinine, Ser: 0.92 mg/dL (ref 0.40–1.50)
GFR: 83.75 mL/min (ref >60.00)
Glucose, Bld: 93 mg/dL (ref 70–99)
Potassium: 4.1 mEq/L (ref 3.5–5.1)
Sodium: 138 mEq/L (ref 135–145)
Total Bilirubin: 0.6 mg/dL (ref 0.2–1.2)
Total Protein: 7 g/dL (ref 6.0–8.3)

## 2021-07-26 NOTE — Assessment & Plan Note (Signed)
On Levothyroxine Check TSH

## 2021-07-26 NOTE — Patient Instructions (Signed)
Blue-Emu cream -- use 2-3 times a day ? ?

## 2021-07-26 NOTE — Assessment & Plan Note (Signed)
Monitor PSA w/Dr Jeffie Pollock

## 2021-07-26 NOTE — Assessment & Plan Note (Signed)
Cont on [facility]

## 2021-07-26 NOTE — Progress Notes (Addendum)
Subjective:  Patient ID: Jacob [name], male    DOB: 1950-08-31  Age: 71 y.o. MRN: [id number]  CC: No chief complaint on file.   HPI Jacob [name] presents for OA, palpitations, hypothyroidism  Outpatient Medications Prior to Visit  Medication Sig Dispense Refill   cholecalciferol (VITAMIN D) 1000 UNITS tablet Take 1,000 Units by mouth every evening.      glucosamine-chondroitin 500-400 MG tablet Take 2 tablets by mouth every evening.     KRILL OIL PO One daily     L-Lysine 500 MG TABS Take by mouth.     levothyroxine (SYNTHROID) 50 MCG tablet TAKE 1 TABLET BY MOUTH EVERY DAY 90 tablet 2   Multiple Vitamins-Minerals (MULTIVITAL PO) Take 1 tablet by mouth every evening.     OVER THE COUNTER MEDICATION Take 500 mg by mouth every evening. Lysin tab once daily     famotidine (PEPCID) 20 MG tablet TAKE 1 TABLET(20 MG) BY MOUTH TWICE DAILY 180 tablet 3   atorvastatin (LIPITOR) 10 MG tablet Take 1 tablet (10 mg total) by mouth daily. 30 tablet 11   amoxicillin (AMOXIL) 500 MG tablet TK 4 TS PO 1 HOUR PRIOR TO TREATMENT (Patient not taking: Reported on 07/26/2020)  0   No facility-administered medications prior to visit.    ROS: Review of Systems  Constitutional:  Negative for appetite change, fatigue and unexpected weight change.  HENT:  Negative for congestion, nosebleeds, sneezing, sore throat and trouble swallowing.   Eyes:  Negative for itching and visual disturbance.  Respiratory:  Negative for cough.   Cardiovascular:  Negative for chest pain, palpitations and leg swelling.  Gastrointestinal:  Negative for abdominal distention, blood in stool, diarrhea and nausea.  Genitourinary:  Negative for frequency and hematuria.  Musculoskeletal:  Positive for arthralgias. Negative for back pain, gait problem, joint swelling and neck pain.  Skin:  Negative for rash.  Neurological:  Negative for dizziness, tremors, speech difficulty and weakness.  Psychiatric/Behavioral:  Negative  for agitation, dysphoric mood and sleep disturbance. The patient is not nervous/anxious.     Objective:  BP 118/78 (BP Location: Left Arm, Patient Position: Sitting, Cuff Size: Normal)   Pulse (!) 54   Temp 97.7 F (36.5 C) (Oral)   Ht '5\' 11"'$  (1.803 m)   Wt 187 lb (84.8 kg)   SpO2 98%   BMI 26.08 kg/m   BP Readings from Last 3 Encounters:  07/26/21 118/78  04/19/21 110/70  01/25/21 110/68    Wt Readings from Last 3 Encounters:  07/26/21 187 lb (84.8 kg)  04/19/21 193 lb 12.8 oz (87.9 kg)  01/25/21 194 lb 12.8 oz (88.4 kg)    Physical Exam Constitutional:      General: He is not in acute distress.    Appearance: He is well-developed.     Comments: NAD  Eyes:     Conjunctiva/sclera: Conjunctivae normal.     Pupils: Pupils are equal, round, and reactive to light.  Neck:     Thyroid: No thyromegaly.     Vascular: No JVD.  Cardiovascular:     Rate and Rhythm: Normal rate and regular rhythm.     Heart sounds: Normal heart sounds. No murmur heard.    No friction rub. No gallop.  Pulmonary:     Effort: Pulmonary effort is normal. No respiratory distress.     Breath sounds: Normal breath sounds. No wheezing or rales.  Chest:     Chest wall: No tenderness.  Abdominal:  General: Bowel sounds are normal. There is no distension.     Palpations: Abdomen is soft. There is no mass.     Tenderness: There is no abdominal tenderness. There is no guarding or rebound.  Musculoskeletal:        General: No tenderness. Normal range of motion.     Cervical back: Normal range of motion.  Lymphadenopathy:     Cervical: No cervical adenopathy.  Skin:    General: Skin is warm and dry.     Findings: No rash.  Neurological:     Mental Status: He is alert and oriented to person, place, and time.     Cranial Nerves: No cranial nerve deficit.     Motor: No abnormal muscle tone.     Coordination: Coordination normal.     Gait: Gait normal.     Deep Tendon Reflexes: Reflexes are normal  and symmetric.  Psychiatric:        Behavior: Behavior normal.        Thought Content: Thought content normal.        Judgment: Judgment normal.     Lab Results  Component Value Date   WBC 5.2 10/12/2020   HGB 14.6 10/12/2020   HCT 42.9 10/12/2020   PLT 174.0 10/12/2020   GLUCOSE 95 01/25/2021   CHOL 170 01/25/2021   TRIG 93.0 01/25/2021   HDL 57.30 01/25/2021   LDLDIRECT 126.2 10/24/2011   LDLCALC 94 01/25/2021   ALT 10 01/25/2021   AST 18 01/25/2021   NA 137 01/25/2021   K 4.2 01/25/2021   CL 102 01/25/2021   CREATININE 0.95 01/25/2021   BUN 18 01/25/2021   CO2 27 01/25/2021   TSH 2.70 01/25/2021   PSA 4.45 (H) 10/31/2015   INR 1.0 10/12/2020   HGBA1C 5.6 04/29/2013    DL FLUORO GUIDED NEEDLE PLC ASPIRATION / INJECTTION/LOC  Result Date: 01/14/2018 CLINICAL DATA:  Post traumatic arthritis, pain. EXAM: RIGHT HIP INJECTION UNDER FLUOROSCOPY FLUOROSCOPY TIME:  5 seconds; 8 uGym2 DAP TECHNIQUE: The procedure, risks (including but not limited to bleeding, infection, organ damage ), benefits, and alternatives were explained to the patient. Questions regarding the procedure were encouraged and answered. The patient understands and consents to the procedure. An appropriate skin entry site was determined under fluoroscopy. Site was marked, prepped with Betadine, draped in usual sterile fashion, infiltrated locally with 1% lidocaine. A 22 gauge spinal needle was advanced to the lateral margin of the femoral head. 1 ml lidocaine 1% injected easily. Contrast injection of 2 ml Omnipaque-300 showed intraarticular spread without any intravascular component. 120 mg Depo-Medrol and 5 ml lidocaine 1% was administered. The patient tolerated procedure well. COMPLICATIONS: None immediate IMPRESSION: 1. Technically successful  right hip injection under fluoroscopy Electronically Signed   By: Lucrezia Europe M.D.   On: 01/14/2018 11:21    Assessment & Plan:   Problem List Items Addressed This Visit      Coronary atherosclerosis    Cont on Krill oil      Relevant Orders   Comprehensive metabolic panel   TSH   CBC with Differential/Platelet   Lipid panel   Elevated coronary artery calcium score    Cont on Krill oil      Hypothyroidism - Primary    On Levothyroxine Check TSH      Relevant Orders   Comprehensive metabolic panel   TSH   CBC with Differential/Platelet   Lipid panel   Osteoarthritis    Blue-Emu cream was recommended  to use 2-3 times a day      Relevant Orders   Comprehensive metabolic panel   TSH   CBC with Differential/Platelet   Prostate cancer (HCC)    Monitor PSA w/Dr Jeffie Pollock         No orders of the defined types were placed in this encounter.     Follow-up: Return in about 6 months (around 01/25/2022) for Wellness Exam.  Walker Kehr, MD

## 2021-07-26 NOTE — Assessment & Plan Note (Signed)
Blue-Emu cream was recommended to use 2-3 times a day ? ?

## 2021-08-02 DIAGNOSIS — N5201 Erectile dysfunction due to arterial insufficiency: Secondary | ICD-10-CM | POA: Diagnosis not present

## 2021-08-02 DIAGNOSIS — R9721 Rising PSA following treatment for malignant neoplasm of prostate: Secondary | ICD-10-CM | POA: Diagnosis not present

## 2021-08-02 DIAGNOSIS — Z8546 Personal history of malignant neoplasm of prostate: Secondary | ICD-10-CM | POA: Diagnosis not present

## 2021-08-25 ENCOUNTER — Other Ambulatory Visit: Payer: Self-pay | Admitting: Internal Medicine

## 2021-08-31 DIAGNOSIS — C61 Malignant neoplasm of prostate: Secondary | ICD-10-CM | POA: Diagnosis not present

## 2021-10-03 DIAGNOSIS — Z23 Encounter for immunization: Secondary | ICD-10-CM | POA: Diagnosis not present

## 2021-10-23 ENCOUNTER — Telehealth: Payer: Self-pay | Admitting: Internal Medicine

## 2021-10-23 MED ORDER — PREDNISONE 10 MG PO TABS: ORAL_TABLET | ORAL | 0 refills | Status: DC

## 2021-10-23 NOTE — Telephone Encounter (Signed)
Patient is going out of the country in 2 weeks and would like something called in for high altitude sickness.  Please send to [place] on [address], [address].

## 2021-10-23 NOTE — Telephone Encounter (Signed)
Okay to take prednisone for 3 days-prescription provided Thanks

## 2021-10-24 NOTE — Telephone Encounter (Signed)
Notified pt w/MD response,,,/lmb

## 2021-10-26 DIAGNOSIS — M25552 Pain in left hip: Secondary | ICD-10-CM | POA: Diagnosis not present

## 2021-10-26 DIAGNOSIS — Z96641 Presence of right artificial hip joint: Secondary | ICD-10-CM | POA: Diagnosis not present

## 2021-11-01 DIAGNOSIS — C61 Malignant neoplasm of prostate: Secondary | ICD-10-CM | POA: Diagnosis not present

## 2021-11-01 DIAGNOSIS — Z23 Encounter for immunization: Secondary | ICD-10-CM | POA: Diagnosis not present

## 2021-12-28 DIAGNOSIS — L821 Other seborrheic keratosis: Secondary | ICD-10-CM | POA: Diagnosis not present

## 2021-12-28 DIAGNOSIS — L814 Other melanin hyperpigmentation: Secondary | ICD-10-CM | POA: Diagnosis not present

## 2021-12-28 DIAGNOSIS — D225 Melanocytic nevi of trunk: Secondary | ICD-10-CM | POA: Diagnosis not present

## 2021-12-28 DIAGNOSIS — L570 Actinic keratosis: Secondary | ICD-10-CM | POA: Diagnosis not present

## 2021-12-28 DIAGNOSIS — Z85828 Personal history of other malignant neoplasm of skin: Secondary | ICD-10-CM | POA: Diagnosis not present

## 2022-02-01 DIAGNOSIS — C61 Malignant neoplasm of prostate: Secondary | ICD-10-CM | POA: Diagnosis not present

## 2022-02-08 DIAGNOSIS — N5201 Erectile dysfunction due to arterial insufficiency: Secondary | ICD-10-CM | POA: Diagnosis not present

## 2022-02-08 DIAGNOSIS — Z8546 Personal history of malignant neoplasm of prostate: Secondary | ICD-10-CM | POA: Diagnosis not present

## 2022-04-12 ENCOUNTER — Telehealth: Payer: Self-pay

## 2022-04-12 NOTE — Telephone Encounter (Signed)
Contacted Jacob [name] to schedule their annual wellness visit. Appointment made for 04/24/22.  Norton Blizzard, [place] ([facility])  [place] [facility] [phone]

## 2022-04-24 ENCOUNTER — Ambulatory Visit (INDEPENDENT_AMBULATORY_CARE_PROVIDER_SITE_OTHER): Payer: Medicare Other

## 2022-04-24 VITALS — Ht 71.0 in | Wt 187.0 lb

## 2022-04-24 DIAGNOSIS — Z0000 Encounter for general adult medical examination without abnormal findings: Secondary | ICD-10-CM

## 2022-04-24 NOTE — Progress Notes (Addendum)
Subjective:   Jacob [name] is a 72 y.o. male who presents for Medicare Annual/Subsequent preventive examination.  I connected with  Jacob [name] on 04/24/22 by a audio enabled telemedicine application and verified that I am speaking with the correct person using two identifiers.  Patient Location: Home  Provider Location: Home Office  I discussed the limitations of evaluation and management by telemedicine. The patient expressed understanding and agreed to proceed.  Review of Systems     Cardiac Risk Factors include: advanced age (>22mn, >>57women);dyslipidemia;male gender     Objective:    Today's Vitals   04/24/22 1926  Weight: 187 lb (84.8 kg)  Height: '5\' 11"'$  (1.803 m)   Body mass index is 26.08 kg/m.     04/24/2022    7:29 PM 04/19/2021   10:18 AM 04/18/2020   10:12 AM 12/12/2016    3:28 PM 09/21/2016    1:04 PM 08/30/2016    6:43 AM 08/24/2013    9:35 AM  Advanced Directives  Does Patient Have a Medical Advance Directive? Yes Yes Yes Yes Yes Yes Patient has advance directive, copy not in chart  Type of Advance Directive Living will;Healthcare Power of Attorney Living will;Healthcare Power of Attorney Living will;Healthcare Power of [place] will  Does patient want to make changes to medical advance directive? No - Patient declined No - Patient declined No - Patient declined      Copy of [place] Chart? No - copy requested No - copy requested No - copy requested No - copy requested No - copy requested No - copy requested     Current Medications (verified) Outpatient Encounter Medications as of 04/24/2022  Medication Sig   atorvastatin (LIPITOR) 10 MG tablet Take 1 tablet (10 mg total) by mouth daily.   cholecalciferol (VITAMIN D) 1000 UNITS tablet Take 1,000 Units by mouth every  evening.    glucosamine-chondroitin 500-400 MG tablet Take 2 tablets by mouth every evening.   KRILL OIL PO One daily   L-Lysine 500 MG TABS Take by mouth.   levothyroxine (SYNTHROID) 50 MCG tablet TAKE 1 TABLET BY MOUTH EVERY DAY   Multiple Vitamins-Minerals (MULTIVITAL PO) Take 1 tablet by mouth every evening.   OVER THE COUNTER MEDICATION Take 500 mg by mouth every evening. Lysin tab once daily   [DISCONTINUED] predniSONE (DELTASONE) 10 MG tablet Take 20 mg daily for 3 days upon arrival to high altitude   No facility-administered encounter medications on file as of 04/24/2022.    Allergies (verified) Pneumovax [pneumococcal polysaccharide vaccine]   History: Past Medical History:  Diagnosis Date   Anemia due to blood loss, acute 12/11/2010   10/12 post polypectomy lower GI bleed; Dr BGlennon Hamilton   Eczema    hot tub related   GI bleed    s/p polypectomy   Herpes zoster 2010   Hx of colonic polyps 12/11/2010   Last colon 2012 Dr GCarlean Purl  Prostate [facility] ([facility]    SVT (supraventricular tachycardia)    Past Surgical History:  Procedure Laterality Date   APPENDECTOMY  05/2010   COLONOSCOPY W/ BIOPSIES     CYSTOSCOPY N/A 08/30/2016   Procedure: CYSTOSCOPY FLEXIBLE;  Surgeon: WIrine Seal MD;  [facility]  Service: Urology;  Laterality: N/A;  no seeds found in bladder   ELECTROPHYSIOLOGY STUDY  08/24/13   EPS by Dr  Lovena Le with no inducible SVT or evidence of dual AV nodal physiology   PROSTATE BIOPSY     RADIOACTIVE SEED IMPLANT N/A 08/30/2016   Procedure: RADIOACTIVE SEED IMPLANT/BRACHYTHERAPY IMPLANT WITH SPACE OAR;  Surgeon: Irine Seal, MD;  Location: [facility];  Service: Urology;  Laterality: N/A;    62   seeds implanted   ROTATOR CUFF REPAIR  2003   right   SUPRAVENTRICULAR TACHYCARDIA ABLATION N/A 08/24/2013   Procedure: SUPRAVENTRICULAR TACHYCARDIA ABLATION;  Surgeon: Evans Lance, MD;  Location: [facility] CATH LAB;  Service: Cardiovascular;   Laterality: N/A;   Family History  Problem Relation Age of Onset   Heart disease Mother        pacemaker   Cancer Mother        lung/smoker/passed at age 55   Heart attack Father    Cancer Paternal Uncle        pancreatic   Social History   Socioeconomic History   Marital status: Married    Spouse name: Not on file   Number of children: 2   Years of education: Not on file   Highest education level: Not on file  Occupational History   Occupation: retired  Tobacco Use   Smoking status: Never    Passive exposure: Yes   Smokeless tobacco: Never   Tobacco comments:    occasional  Vaping Use   Vaping Use: Never used  Substance and Sexual Activity   Alcohol use: Yes    Comment: rare-weekend drinker   Drug use: No   Sexual activity: Yes  Other Topics Concern   Not on file  Social History Narrative   Married, 2 kids and + grandchildren   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   Regular exercise- yes, cycling   Social Determinants of Health   Financial Resource Strain: [place]  (04/24/2022)   Overall Financial Resource Strain (CARDIA)    Difficulty of Paying Living Expenses: Not hard at all  Food Insecurity: No [place] (04/24/2022)   Hunger Vital Sign    Worried About Running Out of Food in the Last Year: Never true    [place] in the Last Year: Never true  Transportation Needs: No Transportation Needs (04/24/2022)   PRAPARE - Hydrologist (Medical): No    Lack of Transportation (Non-Medical): No  Physical Activity: Sufficiently Active (04/24/2022)   Exercise Vital Sign    Days of Exercise per Week: 5 days    Minutes of Exercise per Session: 50 min  Stress: No Stress Concern Present (04/24/2022)   [place]    Feeling of Stress : Not at all  Social Connections: [place] (04/24/2022)   Social Connection and Isolation Panel  [NHANES]    Frequency of Communication with Friends and Family: More than three times a week    Frequency of Social Gatherings with Friends and Family: More than three times a week    Attends Religious Services: More than 4 times per year    Active Member of [employer] or Organizations: Yes    Attends Music therapist: More than 4 times per year    Marital Status: Married    Tobacco Counseling Counseling given: Not Answered Tobacco comments: occasional   Clinical Intake:  Pre-visit preparation completed: Yes  Pain : No/denies pain  Diabetes: No  How often do you need to have someone help you when you  read instructions, pamphlets, or other written materials from your doctor or pharmacy?: 1 - Never  Diabetic?No   Interpreter Needed?: No  Information entered by :: Denman George LPN   Activities of Daily Living    04/24/2022    7:29 PM  In your present state of health, do you have any difficulty performing the following activities:  Hearing? 0  Vision? 0  Difficulty concentrating or making decisions? 0  Walking or climbing stairs? 0  Dressing or bathing? 0  Doing errands, shopping? 0  Preparing Food and eating ? N  Using the Toilet? N  In the past six months, have you accidently leaked urine? N  Do you have problems with loss of bowel control? N  Managing your Medications? N  Managing your Finances? N  Housekeeping or managing your Housekeeping? N    Patient Care Team: Plotnikov, Evie Lacks, MD as PCP - General Rosana Berger, MD as Referring Physician (Gastroenterology) Tyler Pita, MD as Consulting Physician (Radiation Oncology) Irine Seal, MD as Attending Physician (Urology) Nahser, Wonda Cheng, MD as Consulting Physician (Cardiology) Evans Lance, MD as Consulting Physician (Cardiology) Sanda Klein, MD as Consulting Physician (Cardiology) Gaynelle Arabian, MD as Consulting Physician (Orthopedic Surgery) Harriett Sine, MD as Consulting  Physician (Dermatology) [place], [facility] Jearld Lesch, [place] (Orthopedic Surgery)  Indicate any recent Medical Services you may have received from other than [facility] providers in the past year (date may be approximate).     Assessment:   This is a routine wellness examination for Alpheus.  Hearing/Vision screen Hearing Screening - Comments:: Denies hearing difficulties   Vision Screening - Comments:: Wears rx glasses - up to date with routine eye exams with Dr. Gershon Crane    Dietary issues and exercise activities discussed: Current Exercise Habits: Home exercise routine, Type of exercise: walking;Other - see comments (biking and golf), Time (Minutes): 40, Frequency (Times/Week): 5, Weekly Exercise (Minutes/Week): 200, Intensity: Moderate   Goals Addressed             This Visit's Progress    lose weight   On track    Continue to be physically active and eat healthy. Maintain current healthy status      Depression Screen    04/24/2022    7:28 PM 04/19/2021   10:07 AM 04/18/2020    9:46 AM 01/15/2019    8:00 AM 12/12/2016    3:29 PM 09/21/2016    1:04 PM 06/04/2016    7:52 AM  PHQ 2/9 Scores  PHQ - 2 Score 0 0 0 0 0 0 0  PHQ- 9 Score     0      Fall Risk    04/24/2022    7:27 PM 04/19/2021   10:08 AM 04/18/2020    9:46 AM 12/30/2017    2:58 PM 12/12/2016    3:29 PM  [place] in the past year? 0 0 0 0 No  Comment    Emmi Telephone Survey: data to providers prior to load   Number falls in past yr: 0 0 0    Injury with Fall? 0 0 0    Risk for fall due to : No Fall Risks No Fall Risks No Fall Risks    Follow up Falls prevention discussed;Education provided;Falls evaluation completed Falls evaluation completed Falls evaluation completed      FALL RISK PREVENTION PERTAINING TO THE HOME:  Any stairs in or around the home? Yes  If so, are there any  without handrails? No  Home free of loose throw rugs in walkways, pet beds, electrical cords, etc? Yes  Adequate  lighting in your home to reduce risk of falls? Yes   ASSISTIVE DEVICES UTILIZED TO PREVENT FALLS:  Life alert? No  Use of a cane, walker or w/c? No  Grab bars in the bathroom? Yes  Shower chair or bench in shower? No  Elevated toilet seat or a handicapped toilet? Yes   TIMED UP AND GO:  Was the test performed? No . Telephonic visit   Cognitive Function:        04/24/2022    7:29 PM  6CIT Screen  What Year? 0 points  What month? 0 points  What time? 0 points  Count back from 20 0 points  Months in reverse 0 points  Repeat phrase 0 points  Total Score 0 points    Immunizations Immunization History  Administered Date(s) Administered   Fluad Quad(high Dose 65+) 12/29/2019   Influenza Split 10/30/2011   Influenza Whole 12/01/2003, 11/11/2007, 11/23/2008, 11/13/2010   Influenza, High Dose Seasonal PF 12/12/2016, 12/04/2018, 11/12/2020   Influenza,inj,Quad PF,6+ Mos 11/01/2015   Influenza-Unspecified 11/12/2013, 12/13/2017, 10/24/2021   Moderna SARS-COV2 Booster Vaccination 01/01/2020   Moderna Sars-Covid-2 Vaccination 03/20/2019, 04/18/2019   Pfizer Covid-19 Vaccine Bivalent Booster 10yr & up 12/26/2020   Pneumococcal Conjugate-13 11/01/2015   Pneumococcal Polysaccharide-23 12/12/2016   RSV,unspecified 10/24/2021   Tdap 03/14/2011   Unspecified SARS-COV-2 Vaccination 11/02/2021   Zoster, Live 04/28/2013    TDAP status: Due, Education has been provided regarding the importance of this vaccine. Advised may receive this vaccine at local pharmacy or Health Dept. Aware to provide a copy of the vaccination record if obtained from local pharmacy or Health Dept. Verbalized acceptance and understanding.  Flu Vaccine status: Up to date  Pneumococcal vaccine status: Up to date  Covid-19 vaccine status: Information provided on how to obtain vaccines.   Qualifies for Shingles Vaccine? Yes   Zostavax completed Yes   Shingrix Completed?: No.    Education has been provided  regarding the importance of this vaccine. Patient has been advised to call insurance company to determine out of pocket expense if they have not yet received this vaccine. Advised may also receive vaccine at local pharmacy or Health Dept. Verbalized acceptance and understanding.  Screening Tests Health Maintenance  Topic Date Due   Hepatitis C Screening  Never done   Zoster Vaccines- Shingrix (1 of 2) Never done   DTaP/Tdap/Td (2 - Td or Tdap) 03/13/2021   COVID-19 Vaccine (5 - 2023-24 season) 12/28/2021   Medicare Annual Wellness (AWV)  04/24/2023   COLONOSCOPY (Pts 45-462yrInsurance coverage will need to be confirmed)  09/17/2023   Pneumonia Vaccine 6560Years old  Completed   INFLUENZA VACCINE  Completed   HPV VACCINES  Aged Out    Health Maintenance  Health Maintenance Due  Topic Date Due   Hepatitis C Screening  Never done   Zoster Vaccines- Shingrix (1 of 2) Never done   DTaP/Tdap/Td (2 - Td or Tdap) 03/13/2021   COVID-19 Vaccine (5 - 2023-24 season) 12/28/2021    Colorectal cancer screening: Type of screening: Colonoscopy. Completed 09/17/18. Repeat every 5 years  Lung Cancer Screening: (Low Dose CT Chest recommended if Age 838-80ears, 30 pack-year currently smoking OR have quit w/in 15years.) does not qualify.   Lung Cancer Screening Referral: n/a  Additional Screening:  Hepatitis C Screening: does qualify;  Vision Screening: Recommended annual ophthalmology exams for early  detection of glaucoma and other disorders of the eye. Is the patient up to date with their annual eye exam?  Yes  Who is the provider or what is the name of the office in which the patient attends annual eye exams? Dr. Gershon Crane  If pt is not established with a provider, would they like to be referred to a provider to establish care? No .   Dental Screening: Recommended annual dental exams for proper oral hygiene  Community Resource Referral / Chronic Care Management: CRR required this visit?  No    CCM required this visit?  No      Plan:     I have personally reviewed and noted the following in the patient's chart:   Medical and social history Use of alcohol, tobacco or illicit drugs  Current medications and supplements including opioid prescriptions. Patient is not currently taking opioid prescriptions. Functional ability and status Nutritional status Physical activity Advanced directives List of other physicians Hospitalizations, surgeries, and ER visits in previous 12 months Vitals Screenings to include cognitive, depression, and falls Referrals and appointments  In addition, I have reviewed and discussed with patient certain preventive protocols, quality metrics, and best practice recommendations. A written personalized care plan for preventive services as well as general preventive health recommendations were provided to patient.     Vanetta Mulders, [place]   075-GRM   Due to this being a virtual visit, the after visit summary with patients personalized plan was offered to patient via mail or my-chart.  per request, patient was mailed a copy of AVS.   Nurse Notes: No concerns   Medical screening examination/treatment/procedure(s) were performed by non-physician practitioner and as supervising physician I was immediately available for consultation/collaboration.  I agree with above. Lew Dawes, MD

## 2022-04-24 NOTE — Patient Instructions (Signed)
Jacob [name] , Thank you for taking time to come for your Medicare Wellness Visit. I appreciate your ongoing commitment to your health goals. Please review the following plan we discussed and let me know if I can assist you in the future.   These are the goals we discussed:  Goals      lose weight     Continue to be physically active and eat healthy. Maintain current healthy status        This is a list of the screening recommended for you and due dates:  Health Maintenance  Topic Date Due   Hepatitis C Screening: USPSTF Recommendation to screen - Ages 31-79 yo.  Never done   Zoster (Shingles) Vaccine (1 of 2) Never done   DTaP/Tdap/Td vaccine (2 - Td or Tdap) 03/13/2021   COVID-19 Vaccine (5 - 2023-24 season) 12/28/2021   Medicare Annual Wellness Visit  04/24/2023   Colon Cancer Screening  09/17/2023   Pneumonia Vaccine  Completed   Flu Shot  Completed   HPV Vaccine  Aged Out    Advanced directives: Please bring a copy of your health care power of attorney and living will to the office to be added to your chart at your convenience.   Conditions/risks identified: Aim for 30 minutes of exercise or brisk walking, 6-8 glasses of water, and 5 servings of fruits and vegetables each day.   Next appointment: Follow up in one year for your annual wellness visit.   Preventive Care 48 Years and Older, Male  Preventive care refers to lifestyle choices and visits with your health care provider that can promote health and wellness. What does preventive care include? A yearly physical exam. This is also called an annual well check. Dental exams once or twice a year. Routine eye exams. Ask your health care provider how often you should have your eyes checked. Personal lifestyle choices, including: Daily care of your teeth and gums. Regular physical activity. Eating a healthy diet. Avoiding tobacco and drug use. Limiting alcohol use. Practicing safe sex. Taking low doses of aspirin  every day. Taking vitamin and mineral supplements as recommended by your health care provider. What happens during an annual well check? The services and screenings done by your health care provider during your annual well check will depend on your age, overall health, lifestyle risk factors, and family history of disease. Counseling  Your health care provider may ask you questions about your: Alcohol use. Tobacco use. Drug use. Emotional well-being. Home and relationship well-being. Sexual activity. Eating habits. History of falls. Memory and ability to understand (cognition). Work and work Statistician. Screening  You may have the following tests or measurements: Height, weight, and BMI. Blood pressure. Lipid and cholesterol levels. These may be checked every 5 years, or more frequently if you are over 17 years old. Skin check. Lung cancer screening. You may have this screening every year starting at age 59 if you have a 30-pack-year history of smoking and currently smoke or have quit within the past 15 years. Fecal occult blood test (FOBT) of the stool. You may have this test every year starting at age 21. Flexible sigmoidoscopy or colonoscopy. You may have a sigmoidoscopy every 5 years or a colonoscopy every 10 years starting at age 30. Prostate cancer screening. Recommendations will vary depending on your family history and other risks. Hepatitis C blood test. Hepatitis B blood test. Sexually transmitted disease (STD) testing. Diabetes screening. This is done by checking your blood sugar (glucose)  after you have not eaten for a while (fasting). You may have this done every 1-3 years. Abdominal aortic aneurysm (AAA) screening. You may need this if you are a current or former smoker. Osteoporosis. You may be screened starting at age 47 if you are at high risk. Talk with your health care provider about your test results, treatment options, and if necessary, the need for more  tests. Vaccines  Your health care provider may recommend certain vaccines, such as: Influenza vaccine. This is recommended every year. Tetanus, diphtheria, and acellular pertussis (Tdap, Td) vaccine. You may need a Td booster every 10 years. Zoster vaccine. You may need this after age 65. Pneumococcal 13-valent conjugate (PCV13) vaccine. One dose is recommended after age 52. Pneumococcal polysaccharide (PPSV23) vaccine. One dose is recommended after age 45. Talk to your health care provider about which screenings and vaccines you need and how often you need them. This information is not intended to replace advice given to you by your health care provider. Make sure you discuss any questions you have with your health care provider. Document Released: 02/25/2015 Document Revised: 10/19/2015 Document Reviewed: 11/30/2014 Elsevier Interactive Patient Education  2017 [place] Prevention in the Home Falls can cause injuries. They can happen to people of all ages. There are many things you can do to make your home safe and to help prevent falls. What can I do on the outside of my home? Regularly fix the edges of walkways and driveways and fix any cracks. Remove anything that might make you trip as you walk through a door, such as a raised step or threshold. Trim any bushes or trees on the path to your home. Use bright outdoor lighting. Clear any walking paths of anything that might make someone trip, such as rocks or tools. Regularly check to see if handrails are loose or broken. Make sure that both sides of any steps have handrails. Any raised decks and porches should have guardrails on the edges. Have any leaves, snow, or ice cleared regularly. Use sand or salt on walking paths during winter. Clean up any spills in your garage right away. This includes oil or grease spills. What can I do in the bathroom? Use night lights. Install grab bars by the toilet and in the tub and shower.  Do not use towel bars as grab bars. Use non-skid mats or decals in the tub or shower. If you need to sit down in the shower, use a plastic, non-slip stool. Keep the floor dry. Clean up any water that spills on the floor as soon as it happens. Remove soap buildup in the tub or shower regularly. Attach bath mats securely with double-sided non-slip rug tape. Do not have throw rugs and other things on the floor that can make you trip. What can I do in the bedroom? Use night lights. Make sure that you have a light by your bed that is easy to reach. Do not use any sheets or blankets that are too big for your bed. They should not hang down onto the floor. Have a firm chair that has side arms. You can use this for support while you get dressed. Do not have throw rugs and other things on the floor that can make you trip. What can I do in the kitchen? Clean up any spills right away. Avoid walking on wet floors. Keep items that you use a lot in easy-to-reach places. If you need to reach something above you, use a  strong step stool that has a grab bar. Keep electrical cords out of the way. Do not use floor polish or wax that makes floors slippery. If you must use wax, use non-skid floor wax. Do not have throw rugs and other things on the floor that can make you trip. What can I do with my stairs? Do not leave any items on the stairs. Make sure that there are handrails on both sides of the stairs and use them. Fix handrails that are broken or loose. Make sure that handrails are as long as the stairways. Check any carpeting to make sure that it is firmly attached to the stairs. Fix any carpet that is loose or worn. Avoid having throw rugs at the top or bottom of the stairs. If you do have throw rugs, attach them to the floor with carpet tape. Make sure that you have a light switch at the top of the stairs and the bottom of the stairs. If you do not have them, ask someone to add them for you. What else  can I do to help prevent falls? Wear shoes that: Do not have high heels. Have rubber bottoms. Are comfortable and fit you well. Are closed at the toe. Do not wear sandals. If you use a stepladder: Make sure that it is fully opened. Do not climb a closed stepladder. Make sure that both sides of the stepladder are locked into place. Ask someone to hold it for you, if possible. Clearly mark and make sure that you can see: Any grab bars or handrails. First and last steps. Where the edge of each step is. Use tools that help you move around (mobility aids) if they are needed. These include: Canes. Walkers. Scooters. Crutches. Turn on the lights when you go into a dark area. Replace any light bulbs as soon as they burn out. Set up your furniture so you have a clear path. Avoid moving your furniture around. If any of your floors are uneven, fix them. If there are any pets around you, be aware of where they are. Review your medicines with your doctor. Some medicines can make you feel dizzy. This can increase your chance of falling. Ask your doctor what other things that you can do to help prevent falls. This information is not intended to replace advice given to you by your health care provider. Make sure you discuss any questions you have with your health care provider. Document Released: 11/25/2008 Document Revised: 07/07/2015 Document Reviewed: 03/05/2014 Elsevier Interactive Patient Education  2017 [employer].

## 2022-05-03 DIAGNOSIS — H5203 Hypermetropia, bilateral: Secondary | ICD-10-CM | POA: Diagnosis not present

## 2022-05-03 DIAGNOSIS — H524 Presbyopia: Secondary | ICD-10-CM | POA: Diagnosis not present

## 2022-05-03 DIAGNOSIS — H25813 Combined forms of age-related cataract, bilateral: Secondary | ICD-10-CM | POA: Diagnosis not present

## 2022-05-03 DIAGNOSIS — H52203 Unspecified astigmatism, bilateral: Secondary | ICD-10-CM | POA: Diagnosis not present

## 2022-05-09 DIAGNOSIS — Z8546 Personal history of malignant neoplasm of prostate: Secondary | ICD-10-CM | POA: Diagnosis not present

## 2022-05-17 DIAGNOSIS — M25552 Pain in left hip: Secondary | ICD-10-CM | POA: Diagnosis not present

## 2022-05-17 DIAGNOSIS — M1612 Unilateral primary osteoarthritis, left hip: Secondary | ICD-10-CM | POA: Diagnosis not present

## 2022-05-19 DIAGNOSIS — M1632 Unilateral osteoarthritis resulting from hip dysplasia, left hip: Secondary | ICD-10-CM | POA: Insufficient documentation

## 2022-05-20 ENCOUNTER — Emergency Department (HOSPITAL_BASED_OUTPATIENT_CLINIC_OR_DEPARTMENT_OTHER)
Admission: EM | Admit: 2022-05-20 | Discharge: 2022-05-20 | Disposition: A | Discharge status: Home / Self Care | Payer: Medicare Other | Attending: Emergency Medicine | Admitting: Emergency Medicine

## 2022-05-20 ENCOUNTER — Emergency Department (HOSPITAL_BASED_OUTPATIENT_CLINIC_OR_DEPARTMENT_OTHER): Payer: Medicare Other

## 2022-05-20 DIAGNOSIS — C61 Malignant neoplasm of prostate: Secondary | ICD-10-CM | POA: Diagnosis not present

## 2022-05-20 DIAGNOSIS — R3129 Other microscopic hematuria: Secondary | ICD-10-CM | POA: Diagnosis not present

## 2022-05-20 DIAGNOSIS — R311 Benign essential microscopic hematuria: Secondary | ICD-10-CM | POA: Diagnosis not present

## 2022-05-20 DIAGNOSIS — R1032 Left lower quadrant pain: Secondary | ICD-10-CM | POA: Diagnosis not present

## 2022-05-20 DIAGNOSIS — K5730 Diverticulosis of large intestine without perforation or abscess without bleeding: Secondary | ICD-10-CM | POA: Diagnosis not present

## 2022-05-20 DIAGNOSIS — N200 Calculus of kidney: Secondary | ICD-10-CM | POA: Diagnosis not present

## 2022-05-20 DIAGNOSIS — R109 Unspecified abdominal pain: Secondary | ICD-10-CM | POA: Diagnosis present

## 2022-05-20 LAB — URINALYSIS, ROUTINE W REFLEX MICROSCOPIC
Bilirubin Urine: NEGATIVE
Glucose, UA: NEGATIVE mg/dL
Ketones, ur: NEGATIVE mg/dL
Leukocytes,Ua: NEGATIVE
Nitrite: NEGATIVE
Protein, ur: 30 mg/dL — AB
RBC / HPF: >50 RBC/hpf (ref 0–5)
Specific Gravity, Urine: 1.023 (ref 1.005–1.030)
pH: 5.5 (ref 5.0–8.0)

## 2022-05-20 LAB — CBC
HCT: 45.1 % (ref 39.0–52.0)
Hemoglobin: 15.3 g/dL (ref 13.0–17.0)
MCH: 31 pg (ref 26.0–34.0)
MCHC: 33.9 g/dL (ref 30.0–36.0)
MCV: 91.5 fL (ref 80.0–100.0)
Platelets: 199 10*3/uL (ref 150–400)
RBC: 4.93 MIL/uL (ref 4.22–5.81)
RDW: 12.6 % (ref 11.5–15.5)
WBC: 5.1 10*3/uL (ref 4.0–10.5)
nRBC: 0 % (ref 0.0–0.2)

## 2022-05-20 LAB — COMPREHENSIVE METABOLIC PANEL
ALT: 8 U/L (ref 0–44)
AST: 16 U/L (ref 15–41)
Albumin: 4.6 g/dL (ref 3.5–5.0)
Alkaline Phosphatase: 46 U/L (ref 38–126)
Anion gap: 10 (ref 5–15)
BUN: 16 mg/dL (ref 8–23)
CO2: 24 mmol/L (ref 22–32)
Calcium: 9.9 mg/dL (ref 8.9–10.3)
Chloride: 103 mmol/L (ref 98–111)
Creatinine, Ser: 0.97 mg/dL (ref 0.61–1.24)
GFR, Estimated: >60 mL/min (ref >60)
Glucose, Bld: 151 mg/dL — ABNORMAL HIGH (ref 70–99)
Potassium: 4.2 mmol/L (ref 3.5–5.1)
Sodium: 137 mmol/L (ref 135–145)
Total Bilirubin: 0.6 mg/dL (ref 0.3–1.2)
Total Protein: 7.1 g/dL (ref 6.5–8.1)

## 2022-05-20 LAB — LIPASE, BLOOD: Lipase: 34 U/L (ref 11–51)

## 2022-05-20 MED ORDER — FLUCONAZOLE 150 MG PO TABS
150.0000 mg | ORAL_TABLET | Freq: Once | ORAL | Status: AC
  Administered 2022-05-20: 150 mg via ORAL
  Filled 2022-05-20: qty 1

## 2022-05-20 MED ORDER — SODIUM CHLORIDE 0.9 % IV BOLUS
1000.0000 mL | Freq: Once | INTRAVENOUS | Status: AC
  Administered 2022-05-20: 1000 mL via INTRAVENOUS

## 2022-05-20 MED ORDER — MORPHINE SULFATE (PF) 4 MG/ML IV SOLN
4.0000 mg | Freq: Once | INTRAVENOUS | Status: DC
  Filled 2022-05-20: qty 1

## 2022-05-20 MED ORDER — ONDANSETRON HCL 4 MG/2ML IJ SOLN
4.0000 mg | Freq: Once | INTRAMUSCULAR | Status: DC
  Filled 2022-05-20: qty 2

## 2022-05-20 MED ORDER — ONDANSETRON 4 MG PO TBDP: ORAL_TABLET | ORAL | 0 refills | Status: DC

## 2022-05-20 NOTE — ED Triage Notes (Signed)
Pt reports LLQ pain that awoke him from sleep around 1am today.  Per pt, pain briefly subsided, then returned and has been constant since - described as sharp.  Denies v/d.  States his urine looked "dark" this morning.

## 2022-05-20 NOTE — ED Notes (Signed)
Patient transported to CT 

## 2022-05-20 NOTE — ED Provider Notes (Signed)
Jacob [name] EMERGENCY DEPARTMENT AT [facility] Provider Note   CSN: [id number] Arrival date & time: 05/20/22  0854     History  Chief Complaint  Patient presents with   Abdominal Pain    Jacob [name] is a 72 y.o. male.  72 yo M with a chief complaints of left flank pain.  Woke him up from sleep about 1 AM.  Sharp severe.  He went downstairs and sat in a chair and things seem to have gotten better and then he went back to sleep and was awoken again about 430.  Since then has had progressive pain.  Got to the point that it was quite severe just before arrival and actually feels little bit better now.  He feels like his urine is dark but it is denies any dysuria increased frequency or hesitancy.  He feels a little bit reminiscent of when he had had a kidney stone in the past.  He has had his appendix out.  Has had a couple bouts of left lower quadrant pain previously that were thought to be diverticulitis and he was started presumptively on antibiotics without imaging.  He denies fevers or chills.  Denies nausea vomiting or diarrhea.  Denies constipation.  His wife is just getting over a gastrointestinal illness where she had vomiting and diarrhea.   Abdominal Pain      Home Medications Prior to Admission medications   Medication Sig Start Date End Date Taking? Authorizing Provider  ondansetron (ZOFRAN-ODT) 4 MG disintegrating tablet  ODT q4 hours prn nausea/vomit 05/20/22  Yes Melene Plan, DO  atorvastatin (LIPITOR) 10 MG tablet Take 1 tablet (10 mg total) by mouth daily. 01/26/20 04/24/22  Plotnikov, Georgina Quint, MD  cholecalciferol (VITAMIN D) 1000 UNITS tablet Take 1,000 Units by mouth every evening.     [provider]  glucosamine-chondroitin 500-400 MG tablet Take 2 tablets by mouth every evening.    [provider]  KRILL OIL PO One daily    [provider]  L-Lysine 500 MG TABS Take by mouth.    [provider]  levothyroxine  (SYNTHROID) 50 MCG tablet TAKE 1 TABLET BY MOUTH EVERY DAY 08/25/21   Plotnikov, Georgina Quint, MD  Multiple Vitamins-Minerals (MULTIVITAL PO) Take 1 tablet by mouth every evening.    [provider]  OVER THE COUNTER MEDICATION Take 500 mg by mouth every evening. Lysin tab once daily    [provider]      Allergies    Pneumovax [pneumococcal polysaccharide vaccine]    Review of Systems   Review of Systems  Gastrointestinal:  Positive for abdominal pain.    Physical Exam Updated Vital Signs BP (!) 140/76 (BP Location: Right Arm)   Pulse (!) 52   Temp 97.8 F (36.6 C) (Oral)   Resp 11   SpO2 100%  Physical Exam Vitals and nursing note reviewed.  Constitutional:      Appearance: He is well-developed.  HENT:     Head: Normocephalic and atraumatic.  Eyes:     Pupils: Pupils are equal, round, and reactive to light.  Neck:     Vascular: No JVD.  Cardiovascular:     Rate and Rhythm: Normal rate and regular rhythm.     Heart sounds: No murmur heard.    No friction rub. No gallop.  Pulmonary:     Effort: No respiratory distress.     Breath sounds: No wheezing.  Abdominal:     General: There is no distension.  Tenderness: There is no abdominal tenderness. There is no guarding or rebound.     Comments: Benign abdominal exam  Musculoskeletal:        General: Normal range of motion.     Cervical back: Normal range of motion and neck supple.  Skin:    Coloration: Skin is not pale.     Findings: No rash.  Neurological:     Mental Status: He is alert and oriented to person, place, and time.  Psychiatric:        Behavior: Behavior normal.     ED Results / Procedures / Treatments   Labs (all labs ordered are listed, but only abnormal results are displayed) Labs Reviewed  COMPREHENSIVE METABOLIC PANEL - Abnormal; Notable for the following components:      Result Value   Glucose, Bld 151 (*)    All other components within normal limits  URINALYSIS, ROUTINE  W REFLEX MICROSCOPIC - Abnormal; Notable for the following components:   Color, Urine ORANGE (*)    APPearance CLOUDY (*)    Hgb urine dipstick LARGE (*)    Protein, ur 30 (*)    Bacteria, UA RARE (*)    All other components within normal limits  LIPASE, BLOOD  CBC    EKG None  Radiology CT Renal Stone Study  Result Date: 05/20/2022 CLINICAL DATA:  Acute onset of left lower quadrant and left flank pain last night. Prostate carcinoma. * Tracking Code: BO * EXAM: CT ABDOMEN AND PELVIS WITHOUT CONTRAST TECHNIQUE: Multidetector CT imaging of the abdomen and pelvis was performed following the standard protocol without IV contrast. RADIATION DOSE REDUCTION: This exam was performed according to the departmental dose-optimization program which includes automated exposure control, adjustment of the mA and/or kV according to patient size and/or use of iterative reconstruction technique. COMPARISON:  12/04/2010 FINDINGS: Lower chest: No acute findings. Hepatobiliary: Fluid attenuation cyst seen in the left hepatic lobe measuring 7 cm. No mass visualized on this unenhanced exam. Gallbladder is unremarkable. No evidence of biliary ductal dilatation. Pancreas: No mass or inflammatory process visualized on this unenhanced exam. Spleen:  Within normal limits in size. Adrenals/Urinary tract: A few tiny 1-2 mm renal calculi are seen bilaterally. No evidence of ureteral calculi or hydronephrosis. 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 Stomach/Bowel: No evidence of obstruction, inflammatory process, or abnormal fluid collections. Diverticulosis is seen mainly involving the sigmoid colon, however there is no evidence of diverticulitis. Vascular/Lymphatic: No pathologically enlarged lymph nodes identified. No evidence of abdominal aortic aneurysm. Aortic atherosclerotic calcification incidentally noted. 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 Other:   None. Musculoskeletal:  No suspicious bone lesions identified. IMPRESSION: Tiny bilateral renal calculi. No evidence of ureteral calculi, hydronephrosis, or other acute findings. Colonic diverticulosis, without radiographic evidence of diverticulitis. No evidence of metastatic disease. Electronically Signed   By: Danae Orleans M.D.   On: 05/20/2022 09:39    Procedures Procedures    Medications Ordered in ED Medications  morphine (PF) 4 MG/ML injection 4 mg (4 mg Intravenous Not Given 05/20/22 0936)  ondansetron (ZOFRAN) injection 4 mg (4 mg Intravenous Not Given 05/20/22 0937)  sodium chloride 0.9 % bolus 1,000 mL (1,000 mLs Intravenous New Bag/Given 05/20/22 0929)  fluconazole (DIFLUCAN) tablet 150 mg (150 mg Oral Given 05/20/22 1012)    ED Course/ Medical Decision Making/ A&P  Medical Decision Making Amount and/or Complexity of Data Reviewed Labs: ordered. Radiology: ordered.  Risk Prescription drug management.   72 yo M with a cc of LLQ abdominal pain.  Benign abdominal exam.  Started suddenly.  ? Kidney stones vs early viral GI illness.  Blood work, pain meds, iv fluids, CT imaging.   CT scan without obvious acute intra-abdominal pathology.  UA with yeast, blood, rare bacteria.  A bit strange for a 72 year old male.  I discussed the results with him.  Will give a dose of Diflucan here.  Will have him follow-up with his PCP.  If this hematuria continues I discussed that he might need to follow-up with urology in the office.  The patient told me he has a urologist already and plans to call them in a few days if things have not cleared up.  No significant electrolyte abnormality.  No leukocytosis.  2:23 PM:  I have discussed the diagnosis/risks/treatment options with the patient and family.  Evaluation and diagnostic testing in the emergency department does not suggest an emergent condition requiring admission or immediate intervention beyond what has been  performed at this time.  They will follow up with PCP, Urology. We also discussed returning to the ED immediately if new or worsening sx occur. We discussed the sx which are most concerning (e.g., sudden worsening pain, fever, inability to tolerate by mouth) that necessitate immediate return. Medications administered to the patient during their visit and any new prescriptions provided to the patient are listed below.  Medications given during this visit Medications  morphine (PF) 4 MG/ML injection 4 mg (4 mg Intravenous Not Given 05/20/22 0936)  ondansetron (ZOFRAN) injection 4 mg (4 mg Intravenous Not Given 05/20/22 0937)  sodium chloride 0.9 % bolus 1,000 mL (1,000 mLs Intravenous New Bag/Given 05/20/22 0929)  fluconazole (DIFLUCAN) tablet 150 mg (150 mg Oral Given 05/20/22 1012)     The patient appears reasonably screen and/or stabilized for discharge and I doubt any other medical condition or other [facility] requiring further screening, evaluation, or treatment in the ED at this time prior to discharge.          Final Clinical Impression(s) / ED Diagnoses Final diagnoses:  LLQ abdominal pain  Benign essential microscopic hematuria    Rx / DC Orders ED Discharge Orders          Ordered    ondansetron (ZOFRAN-ODT) 4 MG disintegrating tablet        05/20/22 1006              Old HundredFloyd, Birdell Frasier, DO 05/20/22 1423

## 2022-05-20 NOTE — Discharge Instructions (Signed)
Take 4 over the counter ibuprofen tablets 3 times a day or 2 over-the-counter naproxen tablets twice a day for pain. Also take tylenol 1000mg (2 extra strength) four times a day.    There is no concerning finding on your test here today.  Please call your family doctor in the morning.  Please return to the emergency department for worsening symptoms fever or inability eat or drink.

## 2022-05-20 NOTE — ED Notes (Signed)
Dc instructions reviewed with patient. Patient voiced understanding. Dc with belongings.  °

## 2022-05-21 ENCOUNTER — Encounter: Payer: Self-pay | Admitting: Family Medicine

## 2022-05-21 ENCOUNTER — Ambulatory Visit (INDEPENDENT_AMBULATORY_CARE_PROVIDER_SITE_OTHER): Payer: Medicare Other | Admitting: Family Medicine

## 2022-05-21 VITALS — BP 118/66 | HR 70 | Temp 97.8°F | Resp 20 | Ht 71.0 in | Wt 188.0 lb

## 2022-05-21 DIAGNOSIS — R310 Gross hematuria: Secondary | ICD-10-CM

## 2022-05-21 DIAGNOSIS — M1612 Unilateral primary osteoarthritis, left hip: Secondary | ICD-10-CM | POA: Diagnosis not present

## 2022-05-21 DIAGNOSIS — R1012 Left upper quadrant pain: Secondary | ICD-10-CM

## 2022-05-21 LAB — POCT URINALYSIS DIPSTICK
Bilirubin, UA: NEGATIVE
Glucose, UA: NEGATIVE
Ketones, UA: NEGATIVE
Leukocytes, UA: NEGATIVE
Nitrite, UA: NEGATIVE
Protein, UA: NEGATIVE
Spec Grav, UA: 1.025 (ref 1.010–1.025)
Urobilinogen, UA: NEGATIVE E.U./dL — AB
pH, UA: 6 (ref 5.0–8.0)

## 2022-05-21 NOTE — Progress Notes (Signed)
Assessment & Plan:  1. LUQ abdominal pain Resolved.  Discussed he may have passed a kidney stone which would explain his pain and [name].  Advised to let us know if symptoms return.  2. Jacob [name] Resolved Results for orders placed or performed in visit on 05/21/22  POCT urinalysis dipstick  Result Value Ref Range   Color, UA yellow    Clarity, UA clear    Glucose, UA Negative Negative   Bilirubin, UA neg    Ketones, UA neg    Spec Grav, UA 1.025 1.010 - 1.025   Blood, UA 5-10    pH, UA 6.0 5.0 - 8.0   Protein, UA Negative Negative   Urobilinogen, UA negative (A) 0.2 or 1.0 E.U./dL   Nitrite, UA neg    Leukocytes, UA Negative Negative   Appearance     Odor    - POCT urinalysis dipstick   Follow up plan: Return if symptoms worsen or fail to improve.  Deliah BostonBritney Lilac Hoff, MSN, APRN, FNP-C  Subjective:  HPI: Jacob [name] is a 72 y.o. male presenting on 05/21/2022 for er follow up  (ER visit yesterday - LLQ - midline left abd pain. )  Patient is accompanied by his wife, who he is okay with being present.  Patient is here to follow-up on an ER visit from yesterday due to left-sided abdominal pain and blood in his urine.  Denies nausea, vomiting, diarrhea, and fever.  Patient reports abdominal pain that woke him from sleep initially around 1 AM.  He was able to get back to sleep but was awoken again in a few hours with pain so severe he was doubled over on the floor.  He did have chills during this time.  Appetite has remained normal, although he has kept his eating light so does not upset his stomach.  He states his urine was very dark in color, and most orange color.  He went to the ER where his lipase, CBC and CMP were unremarkable.  Urinalysis showed cloudy orange urine with a large amount of blood, rare bacteria, and yeast.  He was treated with fluconazole for the yeast.  CT renal stone study showed tiny bilateral renal calculi and diverticulosis without evidence of  diverticulitis.  He reports the pain started to subside during his stay in the ER.  He was prescribed ondansetron which she has not picked up since he has not nauseated.  He took Advil 600 mg before bed and states he slept well last night.  He reports the pain is better this morning and is now more of an occasional slight discomfort that improves with eating.    ROS: Negative unless specifically indicated above in HPI.   Relevant past medical history reviewed and updated as indicated.   Allergies and medications reviewed and updated.   Current Outpatient Medications:    atorvastatin (LIPITOR) 10 MG tablet, Take 1 tablet (10 mg total) by mouth daily., Disp: 30 tablet, Rfl: 11   cholecalciferol (VITAMIN D) 1000 UNITS tablet, Take 1,000 Units by mouth every evening. , Disp: , Rfl:    glucosamine-chondroitin 500-400 MG tablet, Take 2 tablets by mouth every evening., Disp: , Rfl:    KRILL OIL PO, One daily, Disp: , Rfl:    L-Lysine 500 MG TABS, Take by mouth., Disp: , Rfl:    levothyroxine (SYNTHROID) 50 MCG tablet, TAKE 1 TABLET BY MOUTH EVERY DAY, Disp: 90 tablet, Rfl: 3   Multiple Vitamins-Minerals (MULTIVITAL PO), Take 1 tablet by mouth  every evening., Disp: , Rfl:    Turmeric (QC TUMERIC COMPLEX PO), Take by mouth. OTC, Disp: , Rfl:    ondansetron (ZOFRAN-ODT) 4 MG disintegrating tablet, 4mg  ODT q4 hours prn nausea/vomit (Patient not taking: Reported on 05/21/2022), Disp: 20 tablet, Rfl: 0  Allergies  Allergen Reactions   Pneumovax [Pneumococcal Polysaccharide Vaccine]     Redness, swelling    Objective:   BP 118/66   Pulse 70   Temp 97.8 F (36.6 C)   Resp 20   Ht 5\' 11"  (1.803 m)   Wt 188 lb (85.3 kg)   BMI 26.22 kg/m    Physical Exam Vitals reviewed.  Constitutional:      General: He is not in acute distress.    Appearance: Normal appearance. He is not ill-appearing, toxic-appearing or diaphoretic.  HENT:     Head: Normocephalic and atraumatic.  Eyes:     General: No  scleral icterus.       Right eye: No discharge.        Left eye: No discharge.     Conjunctiva/sclera: Conjunctivae normal.  Cardiovascular:     Rate and Rhythm: Normal rate.  Pulmonary:     Effort: Pulmonary effort is normal. No respiratory distress.  Abdominal:     General: Abdomen is flat. Bowel sounds are normal. There is no distension.     Palpations: Abdomen is soft. There is no mass.     Tenderness: There is no abdominal tenderness. There is no right CVA tenderness, left CVA tenderness, guarding or rebound.     Hernia: No hernia is present.  Musculoskeletal:        General: Normal range of motion.     Cervical back: Normal range of motion.  Skin:    General: Skin is warm and dry.  Neurological:     Mental Status: He is alert and oriented to person, place, and time. Mental status is at baseline.  Psychiatric:        Mood and Affect: Mood normal.        Behavior: Behavior normal.        Thought Content: Thought content normal.        Judgment: Judgment normal.

## 2022-05-22 ENCOUNTER — Other Ambulatory Visit: Payer: Self-pay

## 2022-05-22 ENCOUNTER — Emergency Department (HOSPITAL_BASED_OUTPATIENT_CLINIC_OR_DEPARTMENT_OTHER)
Admission: EM | Admit: 2022-05-22 | Discharge: 2022-05-22 | Disposition: A | Discharge status: Home / Self Care | Payer: Medicare Other | Attending: Emergency Medicine | Admitting: Emergency Medicine

## 2022-05-22 ENCOUNTER — Other Ambulatory Visit (HOSPITAL_BASED_OUTPATIENT_CLINIC_OR_DEPARTMENT_OTHER): Payer: Self-pay

## 2022-05-22 ENCOUNTER — Emergency Department (HOSPITAL_BASED_OUTPATIENT_CLINIC_OR_DEPARTMENT_OTHER): Payer: Medicare Other

## 2022-05-22 ENCOUNTER — Encounter (HOSPITAL_BASED_OUTPATIENT_CLINIC_OR_DEPARTMENT_OTHER): Payer: Self-pay

## 2022-05-22 DIAGNOSIS — Z79899 Other long term (current) drug therapy: Secondary | ICD-10-CM | POA: Diagnosis not present

## 2022-05-22 DIAGNOSIS — N134 Hydroureter: Secondary | ICD-10-CM | POA: Diagnosis not present

## 2022-05-22 DIAGNOSIS — N200 Calculus of kidney: Secondary | ICD-10-CM | POA: Diagnosis not present

## 2022-05-22 DIAGNOSIS — N132 Hydronephrosis with renal and ureteral calculous obstruction: Secondary | ICD-10-CM | POA: Insufficient documentation

## 2022-05-22 DIAGNOSIS — N281 Cyst of kidney, acquired: Secondary | ICD-10-CM | POA: Diagnosis not present

## 2022-05-22 DIAGNOSIS — Z79890 Hormone replacement therapy (postmenopausal): Secondary | ICD-10-CM | POA: Insufficient documentation

## 2022-05-22 DIAGNOSIS — E039 Hypothyroidism, unspecified: Secondary | ICD-10-CM | POA: Diagnosis not present

## 2022-05-22 DIAGNOSIS — I700 Atherosclerosis of aorta: Secondary | ICD-10-CM | POA: Diagnosis not present

## 2022-05-22 DIAGNOSIS — K7689 Other specified diseases of liver: Secondary | ICD-10-CM

## 2022-05-22 DIAGNOSIS — Z8546 Personal history of malignant neoplasm of prostate: Secondary | ICD-10-CM | POA: Diagnosis not present

## 2022-05-22 DIAGNOSIS — R001 Bradycardia, unspecified: Secondary | ICD-10-CM | POA: Diagnosis not present

## 2022-05-22 DIAGNOSIS — R109 Unspecified abdominal pain: Secondary | ICD-10-CM | POA: Diagnosis not present

## 2022-05-22 DIAGNOSIS — R1032 Left lower quadrant pain: Secondary | ICD-10-CM | POA: Diagnosis present

## 2022-05-22 DIAGNOSIS — K760 Fatty (change of) liver, not elsewhere classified: Secondary | ICD-10-CM | POA: Diagnosis not present

## 2022-05-22 DIAGNOSIS — I10 Essential (primary) hypertension: Secondary | ICD-10-CM | POA: Diagnosis not present

## 2022-05-22 LAB — COMPREHENSIVE METABOLIC PANEL
ALT: 6 U/L (ref 0–44)
AST: 15 U/L (ref 15–41)
Albumin: 4.6 g/dL (ref 3.5–5.0)
Alkaline Phosphatase: 43 U/L (ref 38–126)
Anion gap: 9 (ref 5–15)
BUN: 20 mg/dL (ref 8–23)
CO2: 23 mmol/L (ref 22–32)
Calcium: 9.7 mg/dL (ref 8.9–10.3)
Chloride: 103 mmol/L (ref 98–111)
Creatinine, Ser: 1.19 mg/dL (ref 0.61–1.24)
GFR, Estimated: >60 mL/min (ref >60)
Glucose, Bld: 105 mg/dL — ABNORMAL HIGH (ref 70–99)
Potassium: 4.4 mmol/L (ref 3.5–5.1)
Sodium: 135 mmol/L (ref 135–145)
Total Bilirubin: 0.6 mg/dL (ref 0.3–1.2)
Total Protein: 7.1 g/dL (ref 6.5–8.1)

## 2022-05-22 LAB — URINALYSIS, ROUTINE W REFLEX MICROSCOPIC
Bacteria, UA: NONE SEEN
Bilirubin Urine: NEGATIVE
Glucose, UA: NEGATIVE mg/dL
Ketones, ur: NEGATIVE mg/dL
Leukocytes,Ua: NEGATIVE
Nitrite: NEGATIVE
Protein, ur: NEGATIVE mg/dL
Specific Gravity, Urine: 1.02 (ref 1.005–1.030)
pH: 5.5 (ref 5.0–8.0)

## 2022-05-22 LAB — LIPASE, BLOOD: Lipase: 26 U/L (ref 11–51)

## 2022-05-22 LAB — CBC
HCT: 42.6 % (ref 39.0–52.0)
Hemoglobin: 14.6 g/dL (ref 13.0–17.0)
MCH: 30.9 pg (ref 26.0–34.0)
MCHC: 34.3 g/dL (ref 30.0–36.0)
MCV: 90.1 fL (ref 80.0–100.0)
Platelets: 185 10*3/uL (ref 150–400)
RBC: 4.73 MIL/uL (ref 4.22–5.81)
RDW: 12.2 % (ref 11.5–15.5)
WBC: 9.9 10*3/uL (ref 4.0–10.5)
nRBC: 0 % (ref 0.0–0.2)

## 2022-05-22 LAB — LACTIC ACID, PLASMA: Lactic Acid, Venous: 1.7 mmol/L (ref 0.5–1.9)

## 2022-05-22 LAB — TROPONIN I (HIGH SENSITIVITY): Troponin I (High Sensitivity): 3 ng/L (ref <18)

## 2022-05-22 MED ORDER — OXYCODONE HCL 5 MG PO TABS: 5.0000 mg | ORAL_TABLET | ORAL | 0 refills | Status: DC | PRN

## 2022-05-22 MED ORDER — POLYETHYLENE GLYCOL 3350 17 G PO PACK: 17.0000 g | PACK | Freq: Every day | ORAL | 0 refills | Status: AC

## 2022-05-22 MED ORDER — ONDANSETRON 4 MG PO TBDP: 4.0000 mg | ORAL_TABLET | Freq: Three times a day (TID) | ORAL | 0 refills | Status: DC | PRN

## 2022-05-22 MED ORDER — IOHEXOL 300 MG/ML  SOLN
100.0000 mL | Freq: Once | INTRAMUSCULAR | Status: AC | PRN
  Administered 2022-05-22: 85 mL via INTRAVENOUS

## 2022-05-22 MED ORDER — TAMSULOSIN HCL 0.4 MG PO CAPS: 0.4000 mg | ORAL_CAPSULE | Freq: Every day | ORAL | 0 refills | Status: AC

## 2022-05-22 MED ORDER — HYDROMORPHONE HCL 1 MG/ML IJ SOLN
1.0000 mg | Freq: Once | INTRAMUSCULAR | Status: AC
  Administered 2022-05-22: 1 mg via INTRAVENOUS
  Filled 2022-05-22: qty 1

## 2022-05-22 MED ORDER — TAMSULOSIN HCL 0.4 MG PO CAPS
0.4000 mg | ORAL_CAPSULE | Freq: Once | ORAL | Status: AC
  Administered 2022-05-22: 0.4 mg via ORAL
  Filled 2022-05-22: qty 1

## 2022-05-22 MED ORDER — ONDANSETRON HCL 4 MG/2ML IJ SOLN
4.0000 mg | Freq: Once | INTRAMUSCULAR | Status: AC
  Administered 2022-05-22: 4 mg via INTRAVENOUS
  Filled 2022-05-22: qty 2

## 2022-05-22 MED ORDER — LACTATED RINGERS IV BOLUS
1000.0000 mL | Freq: Once | INTRAVENOUS | Status: AC
  Administered 2022-05-22: 1000 mL via INTRAVENOUS

## 2022-05-22 MED ORDER — KETOROLAC TROMETHAMINE 15 MG/ML IJ SOLN
15.0000 mg | Freq: Once | INTRAMUSCULAR | Status: AC
  Administered 2022-05-22: 15 mg via INTRAVENOUS
  Filled 2022-05-22: qty 1

## 2022-05-22 NOTE — Discharge Instructions (Addendum)
You have been seen in the Emergency Department (ED)  today for pain that is likely due to kidney stones.  There was a 3 mm stone on the left side.  As we have discussed, please drink plenty of fluids and use a urinary strainer to attempt to capture the stone if possible. If you are able to catch your stone, please keep it in the provided container and bring it to your urologist appointment. Please make a follow up appointment with your Urology, ideally within the week.  Please take your prescribed Flomax daily. This medication will help you pass the stone. Be aware that this medicine can sometimes lower your blood pressure, so be sure to go slow when you are getting up from a seated or lying position to standing as your lower blood pressure make make you feel lightheaded or even cause you to pass out if you get up too fast.  Please see your doctor or urologist (we have made a referral) as soon as possible, ideally within one week as stones may take 1-3 weeks to pass and you may require additional care or medications.  Return to the Emergency Department (ED)  or call your doctor if you have any worsening pain, fever, painful urination, are unable to urinate, or develop other symptoms that concern you.  You may take prescription pain medication as needed but ONLY as prescribed and ONLY for pain that is not relieved by Motrin (ibuprofen) or Tylenol.  Ibuprofen 600 mg every 6 hours should help with your pain.  Take oxycodone (your prescribed pain medication) as prescribed. Do not drink alcohol, drive or participate in any other potentially dangerous activities while taking this medication as it may make you sleepy. Do not take this medication with any other sedating medications, either prescription or over-the-counter. If you were prescribed Percocet or Vicodin, do not take these with acetaminophen (Tylenol) as it is already contained within these medications.  This medication is an opiate (or narcotic) pain  medication and can be habit forming.  Use it as little as possible to achieve adequate pain control.  Do not use or use it with extreme caution if you have a history of opiate abuse or dependence.  This medication is intended for your use only - do not give any to anyone else and keep it in a secure place where nobody else, especially children, have access to it.  Opiate medications may cause constipation. Ensure you have an adequate amount (25 grams/day) of fiber in your diet and drink plenty of water. You may also wish to take an over the counter stool softener such as Colace once or twice daily according to package instructions while you are taking this medication.  I have put in prescription for MiraLAX.

## 2022-05-22 NOTE — ED Provider Notes (Signed)
[place] EMERGENCY DEPARTMENT AT [facility] Provider Note   CSN: [id number] Arrival date & time: 05/22/22  1307     History  Chief Complaint  Patient presents with   Abdominal Pain    Jacob [name] is a 72 y.o. male.  With PMH of HTN, HLD, hypothyroid disease, prostate cancer who presents with LLQ abdominal pain and decreased BMs.  Was last seen 2 days prior on 05/20/2022 had a CT renal stone study showing bilateral renal stones no active passing stones and diverticulosis without diverticulitis.  Treated for yeast UTI and suspected passed renal stone.  Patient says his pain started 2 days prior when he was first seen in the ED.  He was complaining of left flank pain at the time and urinary symptoms.  Suspected to have passed a stone and found to have yeast in his urine so he took the fluconazole and followed up with his PCP yesterday.  He felt much better yesterday was seen and evaluated had only some mild epigastrium cramping and discharge.  Today he started having severe pain in his left flank and left lower quadrant.  He notes having less bowel movements than normal having last normal bowel movement about 4 days ago.  He has had no blood in his stools.  He has been having nausea but no vomiting.  No fevers, no chills, no chest pain, no shortness of breath, no further urinary symptoms.  Has history of appendectomy.   Abdominal Pain      Home Medications Prior to Admission medications   Medication Sig Start Date End Date Taking? Authorizing Provider  ondansetron (ZOFRAN-ODT) 4 MG disintegrating tablet Take 1 tablet (4 mg total) by mouth every 8 (eight) hours as needed. 05/22/22  Yes Mardene Sayer, MD  oxyCODONE (ROXICODONE) 5 MG immediate release tablet Take 1 tablet (5 mg total) by mouth every 4 (four) hours as needed for up to 10 doses for severe pain. 05/22/22  Yes Mardene Sayer, MD  polyethylene glycol (MIRALAX) 17 g packet Take 17 g by mouth daily for 14 days.  05/22/22 06/05/22 Yes Mardene Sayer, MD  tamsulosin (FLOMAX) 0.4 MG CAPS capsule Take 1 capsule (0.4 mg total) by mouth daily after supper for 14 days. 05/22/22 06/05/22 Yes Mardene Sayer, MD  atorvastatin (LIPITOR) 10 MG tablet Take 1 tablet (10 mg total) by mouth daily. 01/26/20 05/21/22  Plotnikov, Georgina Quint, MD  cholecalciferol (VITAMIN D) 1000 UNITS tablet Take 1,000 Units by mouth every evening.     [provider]  glucosamine-chondroitin 500-400 MG tablet Take 2 tablets by mouth every evening.    [provider]  KRILL OIL PO One daily    [provider]  L-Lysine 500 MG TABS Take by mouth.    [provider]  levothyroxine (SYNTHROID) 50 MCG tablet TAKE 1 TABLET BY MOUTH EVERY DAY 08/25/21   Plotnikov, Georgina Quint, MD  Multiple Vitamins-Minerals (MULTIVITAL PO) Take 1 tablet by mouth every evening.    [provider]  ondansetron (ZOFRAN-ODT) 4 MG disintegrating tablet 4mg  ODT q4 hours prn nausea/vomit Patient not taking: Reported on 05/21/2022 05/20/22   Melene Plan, DO  Turmeric (QC TUMERIC COMPLEX PO) Take by mouth. OTC    [provider]      Allergies    Pneumovax [pneumococcal polysaccharide vaccine]    Review of Systems   Review of Systems  Gastrointestinal:  Positive for abdominal pain.    Physical Exam Updated Vital Signs BP 137/79  Pulse (!) 45   Temp 97.8 F (36.6 C) (Oral)   Resp 14   Ht  (1.803 m)   Wt 85.3 kg   SpO2 100%   BMI 26.23 kg/m  Physical Exam Constitutional: Alert and oriented.  Laying on left side in the fetal position in bed uncomfortable in appearance but nontoxic Eyes: Conjunctivae are normal. ENT      Head: Normocephalic and atraumatic. Cardiovascular: S1, S2, bradycardic, regular rhythm, warm well-perfused Respiratory: Normal respiratory effort. Breath sounds are normal.  O2 sat 100 on RA Gastrointestinal: Distended but soft with voluntary guarding in the left lower quadrant and  left mid abdomen, diffusely tender, worse in the left lower quadrant,left CVA ttp Musculoskeletal: Normal range of motion in all extremities. No pitting edema of lower extremities Neurologic: Normal speech and language.  Moving all extremities equally.  Sensation grossly intact.  No gross focal neurologic deficits are appreciated. Skin: Skin is warm, dry and intact. No rash noted. Psychiatric: Mood and affect are normal. Speech and behavior are normal.  ED Results / Procedures / Treatments   Labs (all labs ordered are listed, but only abnormal results are displayed) Labs Reviewed  COMPREHENSIVE METABOLIC PANEL - Abnormal; Notable for the following components:      Result Value   Glucose, Bld 105 (*)    All other components within normal limits  URINALYSIS, ROUTINE W REFLEX MICROSCOPIC - Abnormal; Notable for the following components:   Hgb urine dipstick TRACE (*)    All other components within normal limits  LIPASE, BLOOD  CBC  LACTIC ACID, PLASMA  TROPONIN I (HIGH SENSITIVITY)    EKG EKG Interpretation  Date/Time:  Tuesday May 22 2022 16:37:41 EDT Ventricular Rate:  50 PR Interval:  190 QRS Duration: 115 QT Interval:  462 QTC Calculation: 422 R Axis:   70 Text Interpretation: Sinus rhythm Probable left ventricular hypertrophy Confirmed by Vivien Rossetti ([id number]) on 05/22/2022 4:43:32 PM  Radiology CT ABDOMEN PELVIS W CONTRAST  Result Date: 05/22/2022 CLINICAL DATA:  Left lower quadrant pain EXAM: CT ABDOMEN AND PELVIS WITH CONTRAST TECHNIQUE: Multidetector CT imaging of the abdomen and pelvis was performed using the standard protocol following bolus administration of intravenous contrast. RADIATION DOSE REDUCTION: This exam was performed according to the departmental dose-optimization program which includes automated exposure control, adjustment of the mA and/or kV according to patient size and/or use of iterative reconstruction technique. CONTRAST:  85 mL OMNIPAQUE IOHEXOL  300 MG/ML  SOLN COMPARISON:  05/20/2022. FINDINGS: Lower chest: Lung bases clear.  No pleural or pericardial effusion. Hepatobiliary: Hepatic cyst measuring 7 cm. No biliary ductal dilatation. Unremarkable gallbladder. Pancreas: Unremarkable. No pancreatic ductal dilatation or surrounding inflammatory changes. Spleen: Normal in size without focal abnormality. Adrenals/Urinary Tract: No nephrolithiasis or hydronephrosis on the right. Left-sided intrarenal stone measures about 3 mm. On the left there is hydronephrosis and hydroureter with a stone at the left UVJ that measures about 3 mm. There is left-sided perinephric stranding. Stomach/Bowel: Stomach is within normal limits. Appendix not identified. No evidence of bowel wall thickening, distention, or inflammatory changes. Diverticulosis of the sigmoid. Vascular/Lymphatic: Aortic atherosclerosis. No enlarged abdominal or pelvic lymph nodes. Reproductive: Prostate brachytherapy seeds noted. Other: No abdominal wall hernia or abnormality. No abdominopelvic ascites. Musculoskeletal: Lumbosacral degenerative changes. IMPRESSION: 1. Left-sided hydronephrosis with perinephric stranding on the basis of a 3 mm left UVJ stone. 2. Intrarenal stone on the left. 3. Large hepatic cyst. 4. Diverticulosis. Electronically Signed   By: Charlaine Dalton.D.  On: 05/22/2022 17:17    Procedures Procedures  Remain on constant cardiac monitoring, sinus bradycardia.  No evidence of heart block.  Medications Ordered in ED Medications  lactated ringers bolus 1,000 mL (1,000 mLs Intravenous New Bag/Given 05/22/22 1630)  HYDROmorphone (DILAUDID) injection 1 mg (1 mg Intravenous Given 05/22/22 1630)  ondansetron (ZOFRAN) injection 4 mg (4 mg Intravenous Given 05/22/22 1630)  iohexol (OMNIPAQUE) 300 MG/ML solution 100 mL (85 mLs Intravenous Contrast Given 05/22/22 1700)  ketorolac (TORADOL) 15 MG/ML injection 15 mg (15 mg Intravenous Given 05/22/22 1738)  tamsulosin (FLOMAX) capsule 0.4 mg  (0.4 mg Oral Given 05/22/22 1813)    ED Course/ Medical Decision Making/ A&P Clinical Course as of 05/22/22 1834  Tue May 22, 2022  1813 Reassessed patient who endorses significant improvement in pain.  Tolerating p.o.  No further vomiting.  Discussed patient findings of 3 mm stone with associated hydronephrosis at UVJ.  Patient will continue p.o. pain meds with Flomax and call his own urologist for follow-up appointment.  Will provide strainer.  Strict return precaution discussed.  He is being discharged in good condition. [VB]    Clinical Course User Index [VB] Mardene SayerBranham, Varvara Legault C, MD                             Medical Decision Making Jacob [name] is a 72 y.o. male.  With PMH of HTN, HLD, hypothyroid disease, prostate cancer who presents with LLQ abdominal pain and decreased BMs. Was last seen 2 days prior on 05/20/2022 had a CT renal stone study showing bilateral renal stones no active passing stones and diverticulosis without diverticulitis.  Treated for yeast UTI and suspected passed renal stone.   Regarding the patient's flank pain and left lower quadrant pain, differential includes but is not limited to nephrolithiasis, pyelonephritis, MSK pain, diverticulitis. Less likely pulmonary source such as pneumonia or PE with no cardiopulmonary complaints, no hypoxia, and no increased work of breathing. Less likely aortic dissection with nontoxic appearance, no cardiopulmonary complaints, and no pulse or neurologic deficits.  EKG sinus bradycardia without any acute ischemic ST/T changes.  Troponin 3 and reassuring.  No concern for ACS.  Labs reviewed generally unremarkable.  Normal white blood cell count 9.9.  Normal creatinine 1.19.  No acute electrolyte abnormalities.  No transaminitis normal lipase.  UA reviewed by me, not consistent with acute infection.  No bacteria.  No WBCs, no nitrate, no leukocyte Estrace.  CTAP with contrast obtained which I personally reviewed which did show  evidence of hydronephrosis with left-sided 3 mm right UVJ stone.  Incidental finding of hepatic cyst.  Diverticulosis without diverticulitis.  Patient's pain controlled medicines here.  Tolerating p.o.Discussed patient findings of 3 mm stone with associated hydronephrosis at UVJ.  Patient will continue p.o. pain meds with Flomax and call his own urologist for follow-up appointment.  Will provide strainer.  Strict return precaution discussed.  He is being discharged in good condition. [VB]   Amount and/or Complexity of Data Reviewed Labs: ordered. Radiology: ordered.  Risk Prescription drug management.    ssion(s) / ED Diagnoses Final diagnoses:  Kidney stone  Hepatic cyst    Rx / DC Orders ED Discharge Orders          Ordered    tamsulosin (FLOMAX) 0.4 MG CAPS capsule  Daily after supper        05/22/22 1831    ondansetron (ZOFRAN-ODT) 4 MG disintegrating tablet  Every 8 hours  PRN        05/22/22 1831    oxyCODONE (ROXICODONE) 5 MG immediate release tablet  Every 4 hours PRN        05/22/22 1831    polyethylene glycol (MIRALAX) 17 g packet  Daily        05/22/22 1831              Mardene Sayer, MD 05/22/22 [phone]

## 2022-05-22 NOTE — ED Triage Notes (Signed)
Patient here POV from Home.  Endorses LLQ ABD Pain that began Sunday. Resolved somewhat and became Achy after being seen in ED and assessed. Felt better and was seen by PCP on Monday.   Seeks Evaluation today for returned Pain that began at 0130. Pain is Left Flank and Cramping Like to LLQ.   No N/V/D. No Fever.   Somewhat Uncomfortable during Triage. A&Ox4. GCS 15. BIB Wheelchair.

## 2022-05-28 ENCOUNTER — Telehealth: Payer: Self-pay | Admitting: *Deleted

## 2022-05-28 NOTE — Telephone Encounter (Signed)
        Patient  visited [facility]  on 05/22/2022  for treatment    Telephone encounter attempt :  1st  A HIPAA compliant voice message was left requesting a return call.  Instructed patient to call back at [phone].  Yehuda Mao Greenauer -[facility], Population Health [phone] [address] , [place] [address] Email : [email or web address]

## 2022-06-12 DIAGNOSIS — Z23 Encounter for immunization: Secondary | ICD-10-CM | POA: Diagnosis not present

## 2022-06-18 ENCOUNTER — Telehealth: Payer: Self-pay

## 2022-06-18 NOTE — Telephone Encounter (Signed)
Called pt and pt stated he would like to wait till next year close to when he needs to do his medicare wellness visit. To Be schedule to for it

## 2022-06-19 ENCOUNTER — Telehealth: Payer: Self-pay | Admitting: Internal Medicine

## 2022-06-19 DIAGNOSIS — E039 Hypothyroidism, unspecified: Secondary | ICD-10-CM

## 2022-06-19 DIAGNOSIS — C61 Malignant neoplasm of prostate: Secondary | ICD-10-CM

## 2022-06-19 NOTE — Telephone Encounter (Signed)
Pt is needing to make follow-up visit. Last saw MD 07/26/2021. Need labs as well..Per chart cpx scheduled for 08/07/22..Gordan Payment

## 2022-06-19 NOTE — Telephone Encounter (Signed)
Pt call having concerns about weight loss over several weeks he drop  from 183 to 180 to 175, but he cycles and he just came back from vacation from which he did a lot of walking. Pt stated he is do not want to lose weight and wanted Dr. Macario Golds to know or should he be concerned.

## 2022-06-19 NOTE — Telephone Encounter (Signed)
I have ordered  labs. Please move up his appointment.  Thank you

## 2022-06-20 NOTE — Telephone Encounter (Signed)
Notified pt w/MD response. Pt is coming in on 07/02/22../l;mb

## 2022-06-26 ENCOUNTER — Other Ambulatory Visit (INDEPENDENT_AMBULATORY_CARE_PROVIDER_SITE_OTHER): Payer: Medicare Other

## 2022-06-26 DIAGNOSIS — E039 Hypothyroidism, unspecified: Secondary | ICD-10-CM | POA: Diagnosis not present

## 2022-06-26 DIAGNOSIS — C61 Malignant neoplasm of prostate: Secondary | ICD-10-CM | POA: Diagnosis not present

## 2022-06-26 LAB — COMPREHENSIVE METABOLIC PANEL
ALT: 9 U/L (ref 0–53)
AST: 15 U/L (ref 0–37)
Albumin: 4.5 g/dL (ref 3.5–5.2)
Alkaline Phosphatase: 45 U/L (ref 39–117)
BUN: 19 mg/dL (ref 6–23)
CO2: 29 mEq/L (ref 19–32)
Calcium: 9.6 mg/dL (ref 8.4–10.5)
Chloride: 102 mEq/L (ref 96–112)
Creatinine, Ser: 1.03 mg/dL (ref 0.40–1.50)
GFR: 72.67 mL/min (ref >60.00)
Glucose, Bld: 100 mg/dL — ABNORMAL HIGH (ref 70–99)
Potassium: 4.5 mEq/L (ref 3.5–5.1)
Sodium: 139 mEq/L (ref 135–145)
Total Bilirubin: 0.5 mg/dL (ref 0.2–1.2)
Total Protein: 6.9 g/dL (ref 6.0–8.3)

## 2022-06-26 LAB — CBC WITH DIFFERENTIAL/PLATELET
Basophils Absolute: 0 10*3/uL (ref 0.0–0.1)
Basophils Relative: 0.6 % (ref 0.0–3.0)
Eosinophils Absolute: 0.1 10*3/uL (ref 0.0–0.7)
Eosinophils Relative: 2.1 % (ref 0.0–5.0)
HCT: 45.6 % (ref 39.0–52.0)
Hemoglobin: 15.7 g/dL (ref 13.0–17.0)
Lymphocytes Relative: 49.2 % — ABNORMAL HIGH (ref 12.0–46.0)
Lymphs Abs: 3.1 10*3/uL (ref 0.7–4.0)
MCHC: 34.5 g/dL (ref 30.0–36.0)
MCV: 92.7 fl (ref 78.0–100.0)
Monocytes Absolute: 0.5 10*3/uL (ref 0.1–1.0)
Monocytes Relative: 8.1 % (ref 3.0–12.0)
Neutro Abs: 2.5 10*3/uL (ref 1.4–7.7)
Neutrophils Relative %: 40 % — ABNORMAL LOW (ref 43.0–77.0)
Platelets: 184 10*3/uL (ref 150.0–400.0)
RBC: 4.92 Mil/uL (ref 4.22–5.81)
RDW: 13.8 % (ref 11.5–15.5)
WBC: 6.3 10*3/uL (ref 4.0–10.5)

## 2022-06-26 LAB — URINALYSIS
Bilirubin Urine: NEGATIVE
Hgb urine dipstick: NEGATIVE
Ketones, ur: NEGATIVE
Leukocytes,Ua: NEGATIVE
Nitrite: NEGATIVE
Specific Gravity, Urine: 1.025 (ref 1.000–1.030)
Total Protein, Urine: NEGATIVE
Urine Glucose: NEGATIVE
Urobilinogen, UA: 0.2 (ref 0.0–1.0)
pH: 6 (ref 5.0–8.0)

## 2022-06-26 LAB — TSH: TSH: 1.93 u[IU]/mL (ref 0.35–5.50)

## 2022-06-26 LAB — PSA: PSA: 0.13 ng/mL (ref 0.10–4.00)

## 2022-06-26 LAB — T4, FREE: Free T4: 0.8 ng/dL (ref 0.60–1.60)

## 2022-07-02 ENCOUNTER — Telehealth: Payer: Self-pay | Admitting: Internal Medicine

## 2022-07-02 ENCOUNTER — Ambulatory Visit (INDEPENDENT_AMBULATORY_CARE_PROVIDER_SITE_OTHER): Payer: Medicare Other | Admitting: Internal Medicine

## 2022-07-02 ENCOUNTER — Encounter: Payer: Self-pay | Admitting: Internal Medicine

## 2022-07-02 ENCOUNTER — Ambulatory Visit (INDEPENDENT_AMBULATORY_CARE_PROVIDER_SITE_OTHER): Payer: Medicare Other

## 2022-07-02 VITALS — BP 140/98 | HR 55 | Temp 97.8°F | Ht 71.0 in | Wt 179.0 lb

## 2022-07-02 DIAGNOSIS — E039 Hypothyroidism, unspecified: Secondary | ICD-10-CM

## 2022-07-02 DIAGNOSIS — R634 Abnormal weight loss: Secondary | ICD-10-CM

## 2022-07-02 DIAGNOSIS — C61 Malignant neoplasm of prostate: Secondary | ICD-10-CM | POA: Diagnosis not present

## 2022-07-02 DIAGNOSIS — R252 Cramp and spasm: Secondary | ICD-10-CM | POA: Diagnosis not present

## 2022-07-02 NOTE — Assessment & Plan Note (Signed)
On Levothyroxine 

## 2022-07-02 NOTE — Patient Instructions (Signed)
Try Magnesium oil spray for gramps

## 2022-07-02 NOTE — Progress Notes (Signed)
Wt loss from 180 to 174   Subjective:  Patient ID: Weston Anna, male    DOB: Dec 08, 1950  Age: 72 y.o. MRN: [id number]  CC: Weight Loss   HPI Adeoluwa Kessner presents for wt loss from 188 to 174 lbs after a bout of kidney stones. He gain some wt back   Outpatient Medications Prior to Visit  Medication Sig Dispense Refill   cholecalciferol (VITAMIN D) 1000 UNITS tablet Take 1,000 Units by mouth every evening.      glucosamine-chondroitin 500-400 MG tablet Take 2 tablets by mouth every evening.     KRILL OIL PO One daily     L-Lysine 500 MG TABS Take by mouth.     levothyroxine (SYNTHROID) 50 MCG tablet TAKE 1 TABLET BY MOUTH EVERY DAY 90 tablet 3   Multiple Vitamins-Minerals (MULTIVITAL PO) Take 1 tablet by mouth every evening.     ondansetron (ZOFRAN-ODT) 4 MG disintegrating tablet Take 1 tablet (4 mg total) by mouth every 8 (eight) hours as needed. 20 tablet 0   oxyCODONE (ROXICODONE) 5 MG immediate release tablet Take 1 tablet (5 mg total) by mouth every 4 (four) hours as needed for up to 10 doses for severe pain. 10 tablet 0   Turmeric (QC TUMERIC COMPLEX PO) Take by mouth. OTC     atorvastatin (LIPITOR) 10 MG tablet Take 1 tablet (10 mg total) by mouth daily. 30 tablet 11   ondansetron (ZOFRAN-ODT) 4 MG disintegrating tablet 4mg  ODT q4 hours prn nausea/vomit (Patient not taking: Reported on 05/21/2022) 20 tablet 0   No facility-administered medications prior to visit.    ROS: Review of Systems  Constitutional:  Negative for appetite change, fatigue and unexpected weight change.  HENT:  Negative for congestion, nosebleeds, sneezing, sore throat and trouble swallowing.   Eyes:  Negative for itching and visual disturbance.  Respiratory:  Negative for cough.   Cardiovascular:  Negative for chest pain, palpitations and leg swelling.  Gastrointestinal:  Negative for abdominal distention, blood in stool, diarrhea and nausea.  Genitourinary:  Negative for frequency and  hematuria.  Musculoskeletal:  Negative for back pain, gait problem, joint swelling and neck pain.  Skin:  Negative for rash.  Neurological:  Negative for dizziness, tremors, speech difficulty and weakness.  Psychiatric/Behavioral:  Negative for agitation, dysphoric mood and sleep disturbance. The patient is not nervous/anxious.     Objective:  BP (!) 140/98 (BP Location: Left Arm, Patient Position: Sitting, Cuff Size: Normal)   Pulse (!) 55   Temp 97.8 F (36.6 C) (Oral)   Ht 5\' 11"  (1.803 m)   Wt 179 lb (81.2 kg)   SpO2 99%   BMI 24.97 kg/m   BP Readings from Last 3 Encounters:  07/02/22 (!) 140/98  05/22/22 134/78  05/21/22 118/66    Wt Readings from Last 3 Encounters:  07/02/22 179 lb (81.2 kg)  05/22/22 188 lb 0.8 oz (85.3 kg)  05/21/22 188 lb (85.3 kg)    Physical Exam Constitutional:      General: He is not in acute distress.    Appearance: Normal appearance. He is well-developed.     Comments: NAD  Eyes:     Conjunctiva/sclera: Conjunctivae normal.     Pupils: Pupils are equal, round, and reactive to light.  Neck:     Thyroid: No thyromegaly.     Vascular: No JVD.  Cardiovascular:     Rate and Rhythm: Normal rate and regular rhythm.     Heart sounds: Normal heart sounds. No  murmur heard.    No friction rub. No gallop.  Pulmonary:     Effort: Pulmonary effort is normal. No respiratory distress.     Breath sounds: Normal breath sounds. No wheezing or rales.  Chest:     Chest wall: No tenderness.  Abdominal:     General: Bowel sounds are normal. There is no distension.     Palpations: Abdomen is soft. There is no mass.     Tenderness: There is no abdominal tenderness. There is no guarding or rebound.  Musculoskeletal:        General: No tenderness. Normal range of motion.     Cervical back: Normal range of motion.  Lymphadenopathy:     Cervical: No cervical adenopathy.  Skin:    General: Skin is warm and dry.     Findings: No rash.  Neurological:      Mental Status: He is alert and oriented to person, place, and time.     Cranial Nerves: No cranial nerve deficit.     Motor: No abnormal muscle tone.     Coordination: Coordination normal.     Gait: Gait normal.     Deep Tendon Reflexes: Reflexes are normal and symmetric.  Psychiatric:        Behavior: Behavior normal.        Thought Content: Thought content normal.        Judgment: Judgment normal.   Looks well  Lab Results  Component Value Date   WBC 6.3 06/26/2022   HGB 15.7 06/26/2022   HCT 45.6 06/26/2022   PLT 184.0 06/26/2022   GLUCOSE 100 (H) 06/26/2022   CHOL 158 07/26/2021   TRIG 99.0 07/26/2021   HDL 59.00 07/26/2021   LDLDIRECT 126.2 10/24/2011   LDLCALC 80 07/26/2021   ALT 9 06/26/2022   AST 15 06/26/2022   NA 139 06/26/2022   K 4.5 06/26/2022   CL 102 06/26/2022   CREATININE 1.03 06/26/2022   BUN 19 06/26/2022   CO2 29 06/26/2022   TSH 1.93 06/26/2022   PSA 0.13 06/26/2022   INR 1.0 10/12/2020   HGBA1C 5.6 04/29/2013    CT ABDOMEN PELVIS W CONTRAST  Result Date: 05/22/2022 CLINICAL DATA:  Left lower quadrant pain EXAM: CT ABDOMEN AND PELVIS WITH CONTRAST TECHNIQUE: Multidetector CT imaging of the abdomen and pelvis was performed using the standard protocol following bolus administration of intravenous contrast. RADIATION DOSE REDUCTION: This exam was performed according to the departmental dose-optimization program which includes automated exposure control, adjustment of the mA and/or kV according to patient size and/or use of iterative reconstruction technique. CONTRAST:  85 mL OMNIPAQUE IOHEXOL 300 MG/ML  SOLN COMPARISON:  05/20/2022. FINDINGS: Lower chest: Lung bases clear.  No pleural or pericardial effusion. Hepatobiliary: Hepatic cyst measuring 7 cm. No biliary ductal dilatation. Unremarkable gallbladder. Pancreas: Unremarkable. No pancreatic ductal dilatation or surrounding inflammatory changes. Spleen: Normal in size without focal abnormality.  Adrenals/Urinary Tract: No nephrolithiasis or hydronephrosis on the right. Left-sided intrarenal stone measures about 3 mm. On the left there is hydronephrosis and hydroureter with a stone at the left UVJ that measures about 3 mm. There is left-sided perinephric stranding. Stomach/Bowel: Stomach is within normal limits. Appendix not identified. No evidence of bowel wall thickening, distention, or inflammatory changes. Diverticulosis of the sigmoid. Vascular/Lymphatic: Aortic atherosclerosis. No enlarged abdominal or pelvic lymph nodes. Reproductive: Prostate brachytherapy seeds noted. Other: No abdominal wall hernia or abnormality. No abdominopelvic ascites. Musculoskeletal: Lumbosacral degenerative changes. IMPRESSION: 1. Left-sided hydronephrosis with perinephric stranding  on the basis of a 3 mm left UVJ stone. 2. Intrarenal stone on the left. 3. Large hepatic cyst. 4. Diverticulosis. Electronically Signed   By: Layla Maw M.D.   On: 05/22/2022 17:17    Assessment & Plan:   Problem List Items Addressed This Visit     Prostate cancer (HCC)    Dr Annabell Howells q 6 months      Hypothyroidism    On Levothyroxine      Weight loss - Primary    Wt loss from 188 to 174 lbs after a bout of kidney stones. He gain some wt back Check labs - TSH etc      Relevant Orders   DG Chest 2 View   Foot cramps    Try Magnesium oil spray for cramps          No orders of the defined types were placed in this encounter.     Follow-up: No follow-ups on file.  Sonda Primes, MD

## 2022-07-02 NOTE — Telephone Encounter (Signed)
Pt called wanting to speak with the nurse about  test results.  Pt has a few questions and concerns. Please advised.

## 2022-07-02 NOTE — Assessment & Plan Note (Signed)
Wt loss from 188 to 174 lbs after a bout of kidney stones. He gain some wt back Check labs - TSH etc

## 2022-07-02 NOTE — Assessment & Plan Note (Signed)
Dr Annabell Howells q 6 months

## 2022-07-02 NOTE — Assessment & Plan Note (Addendum)
Try Magnesium oil spray for cramps Hold Lipitor

## 2022-07-03 NOTE — Telephone Encounter (Signed)
Called pt he states he saw on his results that his neutrophil was low, and the lymphocyte was high. He want to know should he be concern. What those two mean. Also he had CXR yesterday would it have something w/ that. Inform pt will give a call a bck w/MD response.Marland KitchenRaechel Chute

## 2022-07-03 NOTE — Telephone Encounter (Signed)
Labs are okay.  Above-mentioned abnormalities are nonspecific.  Chest x-ray report is not back yet.  Thank you

## 2022-07-04 NOTE — Telephone Encounter (Signed)
Notified pt w/MD response.../lmb 

## 2022-07-31 ENCOUNTER — Other Ambulatory Visit: Payer: Self-pay | Admitting: Internal Medicine

## 2022-07-31 ENCOUNTER — Other Ambulatory Visit: Payer: Medicare Other

## 2022-07-31 ENCOUNTER — Other Ambulatory Visit (INDEPENDENT_AMBULATORY_CARE_PROVIDER_SITE_OTHER): Payer: Medicare Other

## 2022-07-31 DIAGNOSIS — E039 Hypothyroidism, unspecified: Secondary | ICD-10-CM

## 2022-07-31 DIAGNOSIS — R5383 Other fatigue: Secondary | ICD-10-CM | POA: Diagnosis not present

## 2022-07-31 DIAGNOSIS — C61 Malignant neoplasm of prostate: Secondary | ICD-10-CM | POA: Diagnosis not present

## 2022-07-31 LAB — COMPREHENSIVE METABOLIC PANEL
ALT: 9 U/L (ref 0–53)
AST: 15 U/L (ref 0–37)
Albumin: 4.7 g/dL (ref 3.5–5.2)
Alkaline Phosphatase: 45 U/L (ref 39–117)
BUN: 18 mg/dL (ref 6–23)
CO2: 28 mEq/L (ref 19–32)
Calcium: 9.7 mg/dL (ref 8.4–10.5)
Chloride: 101 mEq/L (ref 96–112)
Creatinine, Ser: 1.03 mg/dL (ref 0.40–1.50)
GFR: 72.62 mL/min (ref >60.00)
Glucose, Bld: 106 mg/dL — ABNORMAL HIGH (ref 70–99)
Potassium: 4.5 mEq/L (ref 3.5–5.1)
Sodium: 137 mEq/L (ref 135–145)
Total Bilirubin: 0.7 mg/dL (ref 0.2–1.2)
Total Protein: 7.7 g/dL (ref 6.0–8.3)

## 2022-08-01 ENCOUNTER — Telehealth: Payer: Self-pay | Admitting: *Deleted

## 2022-08-01 ENCOUNTER — Other Ambulatory Visit: Payer: Medicare Other

## 2022-08-01 DIAGNOSIS — E039 Hypothyroidism, unspecified: Secondary | ICD-10-CM

## 2022-08-01 DIAGNOSIS — C61 Malignant neoplasm of prostate: Secondary | ICD-10-CM

## 2022-08-01 LAB — CBC WITH DIFFERENTIAL/PLATELET
Basophils Absolute: 0 10*3/uL (ref 0.0–0.1)
Basophils Relative: 0.8 % (ref 0.0–3.0)
Eosinophils Absolute: 0.2 10*3/uL (ref 0.0–0.7)
Eosinophils Relative: 2.8 % (ref 0.0–5.0)
HCT: 46.1 % (ref 39.0–52.0)
Hemoglobin: 15.5 g/dL (ref 13.0–17.0)
Lymphocytes Relative: 42.1 % (ref 12.0–46.0)
Lymphs Abs: 2.5 10*3/uL (ref 0.7–4.0)
MCHC: 33.5 g/dL (ref 30.0–36.0)
MCV: 94 fl (ref 78.0–100.0)
Monocytes Absolute: 0.6 10*3/uL (ref 0.1–1.0)
Monocytes Relative: 9.7 % (ref 3.0–12.0)
Neutro Abs: 2.7 10*3/uL (ref 1.4–7.7)
Neutrophils Relative %: 44.6 % (ref 43.0–77.0)
Platelets: 202 10*3/uL (ref 150.0–400.0)
RBC: 4.91 Mil/uL (ref 4.22–5.81)
RDW: 13.7 % (ref 11.5–15.5)
WBC: 6 10*3/uL (ref 4.0–10.5)

## 2022-08-01 NOTE — Addendum Note (Signed)
Addended by: Deatra James on: 08/01/2022 09:28 AM   Modules accepted: Orders

## 2022-08-01 NOTE — Telephone Encounter (Signed)
Rec'd msg from lab..stating [9:07 AM] Clodfelter, Tammy goodmorning love, the main lab called and needs for the cbc to be reordered bc they werent able to test it. Beauregard, Jacob [name]. i already called the pt back in and got the sample i just need the order so i can release, thank you boo  Reorder CBC.Marland KitchenRaechel Chute

## 2022-08-07 ENCOUNTER — Ambulatory Visit (INDEPENDENT_AMBULATORY_CARE_PROVIDER_SITE_OTHER): Payer: Medicare Other | Admitting: Internal Medicine

## 2022-08-07 ENCOUNTER — Encounter: Payer: Self-pay | Admitting: Internal Medicine

## 2022-08-07 VITALS — BP 118/70 | HR 65 | Temp 98.2°F | Ht 71.0 in | Wt 179.0 lb

## 2022-08-07 DIAGNOSIS — E7800 Pure hypercholesterolemia, unspecified: Secondary | ICD-10-CM | POA: Diagnosis not present

## 2022-08-07 DIAGNOSIS — I7121 Aneurysm of the ascending aorta, without rupture: Secondary | ICD-10-CM

## 2022-08-07 DIAGNOSIS — C61 Malignant neoplasm of prostate: Secondary | ICD-10-CM | POA: Diagnosis not present

## 2022-08-07 DIAGNOSIS — I2510 Atherosclerotic heart disease of native coronary artery without angina pectoris: Secondary | ICD-10-CM

## 2022-08-07 DIAGNOSIS — I2583 Coronary atherosclerosis due to lipid rich plaque: Secondary | ICD-10-CM | POA: Diagnosis not present

## 2022-08-07 DIAGNOSIS — R634 Abnormal weight loss: Secondary | ICD-10-CM

## 2022-08-07 MED ORDER — LEVOTHYROXINE SODIUM 50 MCG PO TABS: 50.0000 ug | ORAL_TABLET | Freq: Every day | ORAL | 3 refills | Status: DC

## 2022-08-07 MED ORDER — ATORVASTATIN CALCIUM 10 MG PO TABS: 10.0000 mg | ORAL_TABLET | Freq: Every day | ORAL | 3 refills | Status: AC

## 2022-08-07 NOTE — Assessment & Plan Note (Signed)
Resolved

## 2022-08-07 NOTE — Assessment & Plan Note (Signed)
Monitor PSA 

## 2022-08-07 NOTE — Progress Notes (Signed)
Subjective:  Patient ID: Jacob [name], male    DOB: 02-11-1951  Age: 72 y.o. MRN: [id number]  CC: Annual Exam   HPI Jacob [name] presents for annual exam F/u on wt loss, dyslipidemia, CAD... C/o L side of abdomen - pulled the muscle riding a bike a few wks ago off and on. No  pain today. Playing tennis.  Outpatient Medications Prior to Visit  Medication Sig Dispense Refill   cholecalciferol (VITAMIN D) 1000 UNITS tablet Take 1,000 Units by mouth every evening.      glucosamine-chondroitin 500-400 MG tablet Take 2 tablets by mouth every evening.     KRILL OIL PO One daily     L-Lysine 500 MG TABS Take by mouth.     Multiple Vitamins-Minerals (MULTIVITAL PO) Take 1 tablet by mouth every evening.     Turmeric (QC TUMERIC COMPLEX PO) Take by mouth. OTC     levothyroxine (SYNTHROID) 50 MCG tablet TAKE 1 TABLET BY MOUTH EVERY DAY 90 tablet 3   ondansetron (ZOFRAN-ODT) 4 MG disintegrating tablet Take 1 tablet (4 mg total) by mouth every 8 (eight) hours as needed. 20 tablet 0   oxyCODONE (ROXICODONE) 5 MG immediate release tablet Take 1 tablet (5 mg total) by mouth every 4 (four) hours as needed for up to 10 doses for severe pain. 10 tablet 0   atorvastatin (LIPITOR) 10 MG tablet Take 1 tablet (10 mg total) by mouth daily. 30 tablet 11   No facility-administered medications prior to visit.    ROS: Review of Systems  Constitutional:  Negative for appetite change, fatigue and unexpected weight change.  HENT:  Negative for congestion, nosebleeds, sneezing, sore throat and trouble swallowing.   Eyes:  Negative for itching and visual disturbance.  Respiratory:  Negative for cough.   Cardiovascular:  Negative for chest pain, palpitations and leg swelling.  Gastrointestinal:  Negative for abdominal distention, blood in stool, diarrhea and nausea.  Genitourinary:  Negative for frequency and hematuria.  Musculoskeletal:  Negative for back pain, gait problem, joint swelling and neck  pain.  Skin:  Negative for rash.  Neurological:  Negative for dizziness, tremors, speech difficulty and weakness.  Psychiatric/Behavioral:  Negative for agitation, dysphoric mood and sleep disturbance. The patient is not nervous/anxious.     Objective:  BP 118/70 (BP Location: Left Arm, Patient Position: Sitting, Cuff Size: Large)   Pulse 65   Temp 98.2 F (36.8 C) (Oral)   Ht 5\' 11"  (1.803 m)   Wt 179 lb (81.2 kg)   SpO2 95%   BMI 24.97 kg/m   BP Readings from Last 3 Encounters:  08/07/22 118/70  07/02/22 (!) 140/98  05/22/22 134/78    Wt Readings from Last 3 Encounters:  08/07/22 179 lb (81.2 kg)  07/02/22 179 lb (81.2 kg)  05/22/22 188 lb 0.8 oz (85.3 kg)    Physical Exam Constitutional:      General: He is not in acute distress.    Appearance: He is well-developed.     Comments: NAD  Eyes:     Conjunctiva/sclera: Conjunctivae normal.     Pupils: Pupils are equal, round, and reactive to light.  Neck:     Thyroid: No thyromegaly.     Vascular: No JVD.  Cardiovascular:     Rate and Rhythm: Normal rate and regular rhythm.     Heart sounds: Normal heart sounds. No murmur heard.    No friction rub. No gallop.  Pulmonary:     Effort: Pulmonary effort is  normal. No respiratory distress.     Breath sounds: Normal breath sounds. No wheezing or rales.  Chest:     Chest wall: No tenderness.  Abdominal:     General: Bowel sounds are normal. There is no distension.     Palpations: Abdomen is soft. There is no mass.     Tenderness: There is no abdominal tenderness. There is no guarding or rebound.  Musculoskeletal:        General: No tenderness. Normal range of motion.     Cervical back: Normal range of motion.  Lymphadenopathy:     Cervical: No cervical adenopathy.  Skin:    General: Skin is warm and dry.     Findings: No rash.  Neurological:     Mental Status: He is alert and oriented to person, place, and time.     Cranial Nerves: No cranial nerve deficit.      Motor: No abnormal muscle tone.     Coordination: Coordination normal.     Gait: Gait normal.     Deep Tendon Reflexes: Reflexes are normal and symmetric.  Psychiatric:        Behavior: Behavior normal.        Thought Content: Thought content normal.        Judgment: Judgment normal.     Lab Results  Component Value Date   WBC 6.0 08/01/2022   HGB 15.5 08/01/2022   HCT 46.1 08/01/2022   PLT 202.0 08/01/2022   GLUCOSE 106 (H) 07/31/2022   CHOL 158 07/26/2021   TRIG 99.0 07/26/2021   HDL 59.00 07/26/2021   LDLDIRECT 126.2 10/24/2011   LDLCALC 80 07/26/2021   ALT 9 07/31/2022   AST 15 07/31/2022   NA 137 07/31/2022   K 4.5 07/31/2022   CL 101 07/31/2022   CREATININE 1.03 07/31/2022   BUN 18 07/31/2022   CO2 28 07/31/2022   TSH 1.93 06/26/2022   PSA 0.13 06/26/2022   INR 1.0 10/12/2020   HGBA1C 5.6 04/29/2013    CT ABDOMEN PELVIS W CONTRAST  Result Date: 05/22/2022 CLINICAL DATA:  Left lower quadrant pain EXAM: CT ABDOMEN AND PELVIS WITH CONTRAST TECHNIQUE: Multidetector CT imaging of the abdomen and pelvis was performed using the standard protocol following bolus administration of intravenous contrast. RADIATION DOSE REDUCTION: This exam was performed according to the departmental dose-optimization program which includes automated exposure control, adjustment of the mA and/or kV according to patient size and/or use of iterative reconstruction technique. CONTRAST:  85 mL OMNIPAQUE IOHEXOL 300 MG/ML  SOLN COMPARISON:  05/20/2022. FINDINGS: Lower chest: Lung bases clear.  No pleural or pericardial effusion. Hepatobiliary: Hepatic cyst measuring 7 cm. No biliary ductal dilatation. Unremarkable gallbladder. Pancreas: Unremarkable. No pancreatic ductal dilatation or surrounding inflammatory changes. Spleen: Normal in size without focal abnormality. Adrenals/Urinary Tract: No nephrolithiasis or hydronephrosis on the right. Left-sided intrarenal stone measures about 3 mm. On the left there  is hydronephrosis and hydroureter with a stone at the left UVJ that measures about 3 mm. There is left-sided perinephric stranding. Stomach/Bowel: Stomach is within normal limits. Appendix not identified. No evidence of bowel wall thickening, distention, or inflammatory changes. Diverticulosis of the sigmoid. Vascular/Lymphatic: Aortic atherosclerosis. No enlarged abdominal or pelvic lymph nodes. Reproductive: Prostate brachytherapy seeds noted. Other: No abdominal wall hernia or abnormality. No abdominopelvic ascites. Musculoskeletal: Lumbosacral degenerative changes. IMPRESSION: 1. Left-sided hydronephrosis with perinephric stranding on the basis of a 3 mm left UVJ stone. 2. Intrarenal stone on the left. 3. Large hepatic cyst. 4.  Diverticulosis. Electronically Signed   By: Layla Maw M.D.   On: 05/22/2022 17:17    Assessment & Plan:   Problem List Items Addressed This Visit     Prostate cancer (HCC) - Primary    Monitor PSA      Hypercholesterolemia   Relevant Medications   atorvastatin (LIPITOR) 10 MG tablet   Other Relevant Orders   Comprehensive metabolic panel   Lipid panel   TSH   Ascending aortic aneurysm (HCC)    We will continue to monitor periodically      Relevant Medications   atorvastatin (LIPITOR) 10 MG tablet   Coronary atherosclerosis    Cont on Krill oil, Lipitor      Relevant Medications   atorvastatin (LIPITOR) 10 MG tablet   Weight loss    Resolved         Meds ordered this encounter  Medications   atorvastatin (LIPITOR) 10 MG tablet    Sig: Take 1 tablet (10 mg total) by mouth daily.    Dispense:  90 tablet    Refill:  3   levothyroxine (SYNTHROID) 50 MCG tablet    Sig: Take 1 tablet (50 mcg total) by mouth daily.    Dispense:  90 tablet    Refill:  3      Follow-up: No follow-ups on file.  Sonda Primes, MD

## 2022-08-07 NOTE — Assessment & Plan Note (Signed)
Cont on [employer], Lipitor

## 2022-08-07 NOTE — Assessment & Plan Note (Signed)
We will continue to monitor periodically.

## 2022-08-08 DIAGNOSIS — Z8546 Personal history of malignant neoplasm of prostate: Secondary | ICD-10-CM | POA: Diagnosis not present

## 2022-08-15 DIAGNOSIS — N200 Calculus of kidney: Secondary | ICD-10-CM | POA: Diagnosis not present

## 2022-08-15 DIAGNOSIS — Z8546 Personal history of malignant neoplasm of prostate: Secondary | ICD-10-CM | POA: Diagnosis not present

## 2022-10-02 ENCOUNTER — Encounter: Payer: Self-pay | Admitting: Internal Medicine

## 2022-10-02 ENCOUNTER — Ambulatory Visit: Payer: Medicare Other | Admitting: Internal Medicine

## 2022-10-02 VITALS — BP 118/70 | HR 61 | Temp 98.6°F | Ht 71.0 in | Wt 182.0 lb

## 2022-10-02 DIAGNOSIS — E039 Hypothyroidism, unspecified: Secondary | ICD-10-CM

## 2022-10-02 DIAGNOSIS — R634 Abnormal weight loss: Secondary | ICD-10-CM | POA: Diagnosis not present

## 2022-10-02 NOTE — Assessment & Plan Note (Signed)
Monitor CBC 

## 2022-10-02 NOTE — Progress Notes (Signed)
Subjective:  Patient ID: Jacob [name], male    DOB: 11/25/1950  Age: 72 y.o. MRN: [id number]  CC: Follow-up (3 MNTH f/u )   HPI Jacob [name] presents for wt loss f/u, hypothyroidism  Outpatient Medications Prior to Visit  Medication Sig Dispense Refill   atorvastatin (LIPITOR) 10 MG tablet Take 1 tablet (10 mg total) by mouth daily. 90 tablet 3   cholecalciferol (VITAMIN D) 1000 UNITS tablet Take 1,000 Units by mouth every evening.      glucosamine-chondroitin 500-400 MG tablet Take 2 tablets by mouth every evening.     KRILL OIL PO One daily     L-Lysine 500 MG TABS Take by mouth.     levothyroxine (SYNTHROID) 50 MCG tablet Take 1 tablet (50 mcg total) by mouth daily. 90 tablet 3   Multiple Vitamins-Minerals (MULTIVITAL PO) Take 1 tablet by mouth every evening.     Turmeric (QC TUMERIC COMPLEX PO) Take by mouth. OTC     No facility-administered medications prior to visit.    ROS: Review of Systems  Constitutional:  Negative for appetite change, fatigue and unexpected weight change.  HENT:  Negative for congestion, nosebleeds, sneezing, sore throat and trouble swallowing.   Eyes:  Negative for itching and visual disturbance.  Respiratory:  Negative for cough.   Cardiovascular:  Negative for chest pain, palpitations and leg swelling.  Gastrointestinal:  Negative for abdominal distention, blood in stool, diarrhea and nausea.  Genitourinary:  Negative for frequency and hematuria.  Musculoskeletal:  Positive for arthralgias. Negative for back pain, gait problem, joint swelling and neck pain.  Skin:  Negative for rash.  Neurological:  Negative for dizziness, tremors, speech difficulty and weakness.  Psychiatric/Behavioral:  Negative for agitation, dysphoric mood and sleep disturbance. The patient is not nervous/anxious.     Objective:  BP 118/70 (BP Location: Right Arm, Patient Position: Sitting, Cuff Size: Large)   Pulse 61   Temp 98.6 F (37 C) (Oral)   Ht 5\' 11"   (1.803 m)   Wt 182 lb (82.6 kg)   SpO2 98%   BMI 25.38 kg/m   BP Readings from Last 3 Encounters:  10/02/22 118/70  08/07/22 118/70  07/02/22 (!) 140/98    Wt Readings from Last 3 Encounters:  10/02/22 182 lb (82.6 kg)  08/07/22 179 lb (81.2 kg)  07/02/22 179 lb (81.2 kg)    Physical Exam Constitutional:      General: He is not in acute distress.    Appearance: Normal appearance. He is well-developed.     Comments: NAD  Eyes:     Conjunctiva/sclera: Conjunctivae normal.     Pupils: Pupils are equal, round, and reactive to light.  Neck:     Thyroid: No thyromegaly.     Vascular: No JVD.  Cardiovascular:     Rate and Rhythm: Normal rate and regular rhythm.     Heart sounds: Normal heart sounds. No murmur heard.    No friction rub. No gallop.  Pulmonary:     Effort: Pulmonary effort is normal. No respiratory distress.     Breath sounds: Normal breath sounds. No wheezing or rales.  Chest:     Chest wall: No tenderness.  Abdominal:     General: Bowel sounds are normal. There is no distension.     Palpations: Abdomen is soft. There is no mass.     Tenderness: There is no abdominal tenderness. There is no guarding or rebound.  Musculoskeletal:        General:  No tenderness. Normal range of motion.     Cervical back: Normal range of motion.  Lymphadenopathy:     Cervical: No cervical adenopathy.  Skin:    General: Skin is warm and dry.     Findings: No rash.  Neurological:     Mental Status: He is alert and oriented to person, place, and time.     Cranial Nerves: No cranial nerve deficit.     Motor: No abnormal muscle tone.     Coordination: Coordination normal.     Gait: Gait normal.     Deep Tendon Reflexes: Reflexes are normal and symmetric.  Psychiatric:        Behavior: Behavior normal.        Thought Content: Thought content normal.        Judgment: Judgment normal.     Lab Results  Component Value Date   WBC 6.0 08/01/2022   HGB 15.5 08/01/2022   HCT  46.1 08/01/2022   PLT 202.0 08/01/2022   GLUCOSE 106 (H) 07/31/2022   CHOL 158 07/26/2021   TRIG 99.0 07/26/2021   HDL 59.00 07/26/2021   LDLDIRECT 126.2 10/24/2011   LDLCALC 80 07/26/2021   ALT 9 07/31/2022   AST 15 07/31/2022   NA 137 07/31/2022   K 4.5 07/31/2022   CL 101 07/31/2022   CREATININE 1.03 07/31/2022   BUN 18 07/31/2022   CO2 28 07/31/2022   TSH 1.93 06/26/2022   PSA 0.13 06/26/2022   INR 1.0 10/12/2020   HGBA1C 5.6 04/29/2013    CT ABDOMEN PELVIS W CONTRAST  Result Date: 05/22/2022 CLINICAL DATA:  Left lower quadrant pain EXAM: CT ABDOMEN AND PELVIS WITH CONTRAST TECHNIQUE: Multidetector CT imaging of the abdomen and pelvis was performed using the standard protocol following bolus administration of intravenous contrast. RADIATION DOSE REDUCTION: This exam was performed according to the departmental dose-optimization program which includes automated exposure control, adjustment of the mA and/or kV according to patient size and/or use of iterative reconstruction technique. CONTRAST:  85 mL OMNIPAQUE IOHEXOL 300 MG/ML  SOLN COMPARISON:  05/20/2022. FINDINGS: Lower chest: Lung bases clear.  No pleural or pericardial effusion. Hepatobiliary: Hepatic cyst measuring 7 cm. No biliary ductal dilatation. Unremarkable gallbladder. Pancreas: Unremarkable. No pancreatic ductal dilatation or surrounding inflammatory changes. Spleen: Normal in size without focal abnormality. Adrenals/Urinary Tract: No nephrolithiasis or hydronephrosis on the right. Left-sided intrarenal stone measures about 3 mm. On the left there is hydronephrosis and hydroureter with a stone at the left UVJ that measures about 3 mm. There is left-sided perinephric stranding. Stomach/Bowel: Stomach is within normal limits. Appendix not identified. No evidence of bowel wall thickening, distention, or inflammatory changes. Diverticulosis of the sigmoid. Vascular/Lymphatic: Aortic atherosclerosis. No enlarged abdominal or pelvic  lymph nodes. Reproductive: Prostate brachytherapy seeds noted. Other: No abdominal wall hernia or abnormality. No abdominopelvic ascites. Musculoskeletal: Lumbosacral degenerative changes. IMPRESSION: 1. Left-sided hydronephrosis with perinephric stranding on the basis of a 3 mm left UVJ stone. 2. Intrarenal stone on the left. 3. Large hepatic cyst. 4. Diverticulosis. Electronically Signed   By: Layla Maw M.D.   On: 05/22/2022 17:17    Assessment & Plan:   Problem List Items Addressed This Visit     Hypothyroidism    On Levothyroxine      Weight loss - Primary    Resolved Gaining wt         No orders of the defined types were placed in this encounter.     Follow-up: Return in  about 6 months (around 04/04/2023) for Wellness Exam.  Sonda Primes, MD

## 2022-10-02 NOTE — Assessment & Plan Note (Signed)
On Levothyroxine 

## 2022-10-02 NOTE — Assessment & Plan Note (Signed)
Resolved Gaining wt

## 2022-11-01 DIAGNOSIS — M1612 Unilateral primary osteoarthritis, left hip: Secondary | ICD-10-CM | POA: Diagnosis not present

## 2022-11-01 DIAGNOSIS — M25552 Pain in left hip: Secondary | ICD-10-CM | POA: Diagnosis not present

## 2022-11-19 DIAGNOSIS — M25552 Pain in left hip: Secondary | ICD-10-CM | POA: Diagnosis not present

## 2022-12-06 DIAGNOSIS — M1612 Unilateral primary osteoarthritis, left hip: Secondary | ICD-10-CM | POA: Diagnosis not present

## 2022-12-06 DIAGNOSIS — M24152 Other articular cartilage disorders, left hip: Secondary | ICD-10-CM | POA: Diagnosis not present

## 2022-12-07 DIAGNOSIS — S73199A Other sprain of unspecified hip, initial encounter: Secondary | ICD-10-CM | POA: Insufficient documentation

## 2022-12-10 DIAGNOSIS — Z23 Encounter for immunization: Secondary | ICD-10-CM | POA: Diagnosis not present

## 2022-12-21 DIAGNOSIS — Z23 Encounter for immunization: Secondary | ICD-10-CM | POA: Diagnosis not present

## 2023-01-09 DIAGNOSIS — D2262 Melanocytic nevi of left upper limb, including shoulder: Secondary | ICD-10-CM | POA: Diagnosis not present

## 2023-01-09 DIAGNOSIS — Z85828 Personal history of other malignant neoplasm of skin: Secondary | ICD-10-CM | POA: Diagnosis not present

## 2023-01-09 DIAGNOSIS — L814 Other melanin hyperpigmentation: Secondary | ICD-10-CM | POA: Diagnosis not present

## 2023-01-09 DIAGNOSIS — L820 Inflamed seborrheic keratosis: Secondary | ICD-10-CM | POA: Diagnosis not present

## 2023-01-09 DIAGNOSIS — L821 Other seborrheic keratosis: Secondary | ICD-10-CM | POA: Diagnosis not present

## 2023-01-09 DIAGNOSIS — D1801 Hemangioma of skin and subcutaneous tissue: Secondary | ICD-10-CM | POA: Diagnosis not present

## 2023-01-09 DIAGNOSIS — B078 Other viral warts: Secondary | ICD-10-CM | POA: Diagnosis not present

## 2023-01-09 DIAGNOSIS — L570 Actinic keratosis: Secondary | ICD-10-CM | POA: Diagnosis not present

## 2023-01-09 DIAGNOSIS — D225 Melanocytic nevi of trunk: Secondary | ICD-10-CM | POA: Diagnosis not present

## 2023-02-18 ENCOUNTER — Ambulatory Visit: Payer: Medicare Other | Admitting: Internal Medicine

## 2023-04-04 ENCOUNTER — Ambulatory Visit: Payer: Medicare Other | Admitting: Internal Medicine

## 2023-04-09 ENCOUNTER — Ambulatory Visit: Payer: Medicare Other | Admitting: Internal Medicine

## 2023-04-15 ENCOUNTER — Ambulatory Visit: Payer: Medicare Other | Admitting: Internal Medicine

## 2023-04-19 ENCOUNTER — Ambulatory Visit: Payer: Medicare Other | Admitting: Internal Medicine

## 2023-04-19 ENCOUNTER — Encounter: Payer: Self-pay | Admitting: Internal Medicine

## 2023-04-19 VITALS — BP 124/80 | HR 54 | Temp 98.2°F | Ht 71.0 in | Wt 186.0 lb

## 2023-04-19 DIAGNOSIS — R634 Abnormal weight loss: Secondary | ICD-10-CM | POA: Diagnosis not present

## 2023-04-19 DIAGNOSIS — M1990 Unspecified osteoarthritis, unspecified site: Secondary | ICD-10-CM

## 2023-04-19 DIAGNOSIS — E7800 Pure hypercholesterolemia, unspecified: Secondary | ICD-10-CM | POA: Diagnosis not present

## 2023-04-19 DIAGNOSIS — C61 Malignant neoplasm of prostate: Secondary | ICD-10-CM

## 2023-04-19 DIAGNOSIS — I2583 Coronary atherosclerosis due to lipid rich plaque: Secondary | ICD-10-CM

## 2023-04-19 DIAGNOSIS — E039 Hypothyroidism, unspecified: Secondary | ICD-10-CM | POA: Diagnosis not present

## 2023-04-19 LAB — COMPREHENSIVE METABOLIC PANEL
ALT: 11 U/L (ref 0–53)
AST: 20 U/L (ref 0–37)
Albumin: 4.8 g/dL (ref 3.5–5.2)
Alkaline Phosphatase: 46 U/L (ref 39–117)
BUN: 17 mg/dL (ref 6–23)
CO2: 22 meq/L (ref 19–32)
Calcium: 9.8 mg/dL (ref 8.4–10.5)
Chloride: 104 meq/L (ref 96–112)
Creatinine, Ser: 1.03 mg/dL (ref 0.40–1.50)
GFR: 72.25 mL/min (ref >60.00)
Glucose, Bld: 103 mg/dL — ABNORMAL HIGH (ref 70–99)
Potassium: 4.3 meq/L (ref 3.5–5.1)
Sodium: 140 meq/L (ref 135–145)
Total Bilirubin: 0.8 mg/dL (ref 0.2–1.2)
Total Protein: 7.5 g/dL (ref 6.0–8.3)

## 2023-04-19 LAB — LIPID PANEL
Cholesterol: 189 mg/dL (ref 0–200)
HDL: 61.1 mg/dL (ref >39.00)
LDL Cholesterol: 114 mg/dL — ABNORMAL HIGH (ref 0–99)
NonHDL: 127.97
Total CHOL/HDL Ratio: 3
Triglycerides: 72 mg/dL (ref 0.0–149.0)
VLDL: 14.4 mg/dL (ref 0.0–40.0)

## 2023-04-19 LAB — TSH: TSH: 3.58 u[IU]/mL (ref 0.35–5.50)

## 2023-04-19 NOTE — Assessment & Plan Note (Signed)
No wt loss 

## 2023-04-19 NOTE — Assessment & Plan Note (Signed)
On Levothyroxine Check TSH 

## 2023-04-19 NOTE — Progress Notes (Signed)
 Subjective:  Patient ID: Jacob [name], male    DOB: [birth year]  Age: 73 y.o. MRN: [id number]  CC: Medical Management of Chronic Issues (6 mnth f/u)   HPI Jacob [name] presents for hypothyroidism, dyslipidemia , Outpatient Medications Prior to Visit  Medication Sig Dispense Refill   atorvastatin (LIPITOR) 10 MG tablet Take 1 tablet (10 mg total) by mouth daily. 90 tablet 3   cholecalciferol (VITAMIN D) 1000 UNITS tablet Take 1,000 Units by mouth every evening.      glucosamine-chondroitin 500-400 MG tablet Take 2 tablets by mouth every evening.     KRILL OIL PO One daily     L-Lysine 500 MG TABS Take by mouth.     levothyroxine (SYNTHROID) 50 MCG tablet Take 1 tablet (50 mcg total) by mouth daily. 90 tablet 3   Multiple Vitamins-Minerals (MULTIVITAL PO) Take 1 tablet by mouth every evening.     Turmeric (QC TUMERIC COMPLEX PO) Take by mouth. OTC     No facility-administered medications prior to visit.    ROS: Review of Systems  Constitutional:  Negative for appetite change, fatigue and unexpected weight change.  HENT:  Negative for congestion, nosebleeds, sneezing, sore throat and trouble swallowing.   Eyes:  Negative for itching and visual disturbance.  Respiratory:  Negative for cough.   Cardiovascular:  Negative for chest pain, palpitations and leg swelling.  Gastrointestinal:  Negative for abdominal distention, blood in stool, diarrhea and nausea.  Genitourinary:  Negative for frequency and hematuria.  Musculoskeletal:  Positive for arthralgias. Negative for back pain, gait problem, joint swelling and neck pain.  Skin:  Negative for rash.  Neurological:  Negative for dizziness, tremors, speech difficulty and weakness.  Psychiatric/Behavioral:  Negative for agitation, dysphoric mood and sleep disturbance. The patient is not nervous/anxious.     Objective:  BP 124/80   Pulse (!) 54   Temp 98.2 F (36.8 C) (Oral)   Ht 5\' 11"  (1.803 m)   Wt 186 lb (84.4 kg)    SpO2 97%   BMI 25.94 kg/m   BP Readings from Last 3 Encounters:  04/19/23 124/80  10/02/22 118/70  08/07/22 118/70    Wt Readings from Last 3 Encounters:  04/19/23 186 lb (84.4 kg)  10/02/22 182 lb (82.6 kg)  08/07/22 179 lb (81.2 kg)    Physical Exam Constitutional:      General: He is not in acute distress.    Appearance: He is well-developed.     Comments: NAD  Eyes:     Conjunctiva/sclera: Conjunctivae normal.     Pupils: Pupils are equal, round, and reactive to light.  Neck:     Thyroid: No thyromegaly.     Vascular: No JVD.  Cardiovascular:     Rate and Rhythm: Normal rate and regular rhythm.     Heart sounds: Normal heart sounds. No murmur heard.    No friction rub. No gallop.  Pulmonary:     Effort: Pulmonary effort is normal. No respiratory distress.     Breath sounds: Normal breath sounds. No wheezing or rales.  Chest:     Chest wall: No tenderness.  Abdominal:     General: Bowel sounds are normal. There is no distension.     Palpations: Abdomen is soft. There is no mass.     Tenderness: There is no abdominal tenderness. There is no guarding or rebound.  Musculoskeletal:        General: No tenderness. Normal range of motion.     Cervical  back: Normal range of motion.  Lymphadenopathy:     Cervical: No cervical adenopathy.  Skin:    General: Skin is warm and dry.     Findings: No rash.  Neurological:     Mental Status: He is alert and oriented to person, place, and time.     Cranial Nerves: No cranial nerve deficit.     Motor: No abnormal muscle tone.     Coordination: Coordination normal.     Gait: Gait normal.     Deep Tendon Reflexes: Reflexes are normal and symmetric.  Psychiatric:        Behavior: Behavior normal.        Thought Content: Thought content normal.        Judgment: Judgment normal.     Lab Results  Component Value Date   WBC 6.0 08/01/2022   HGB 15.5 08/01/2022   HCT 46.1 08/01/2022   PLT 202.0 08/01/2022   GLUCOSE 106 (H)  07/31/2022   CHOL 158 07/26/2021   TRIG 99.0 07/26/2021   HDL 59.00 07/26/2021   LDLDIRECT 126.2 10/24/2011   LDLCALC 80 07/26/2021   ALT 9 07/31/2022   AST 15 07/31/2022   NA 137 07/31/2022   K 4.5 07/31/2022   CL 101 07/31/2022   CREATININE 1.03 07/31/2022   BUN 18 07/31/2022   CO2 28 07/31/2022   TSH 1.93 06/26/2022   PSA 0.13 06/26/2022   INR 1.0 10/12/2020   HGBA1C 5.6 04/29/2013    CT ABDOMEN PELVIS W CONTRAST Result Date: 05/22/2022 CLINICAL DATA:  Left lower quadrant pain EXAM: CT ABDOMEN AND PELVIS WITH CONTRAST TECHNIQUE: Multidetector CT imaging of the abdomen and pelvis was performed using the standard protocol following bolus administration of intravenous contrast. RADIATION DOSE REDUCTION: This exam was performed according to the departmental dose-optimization program which includes automated exposure control, adjustment of the mA and/or kV according to patient size and/or use of iterative reconstruction technique. CONTRAST:  85 mL OMNIPAQUE IOHEXOL 300 MG/ML  SOLN COMPARISON:  05/20/2022. FINDINGS: Lower chest: Lung bases clear.  No pleural or pericardial effusion. Hepatobiliary: Hepatic cyst measuring 7 cm. No biliary ductal dilatation. Unremarkable gallbladder. Pancreas: Unremarkable. No pancreatic ductal dilatation or surrounding inflammatory changes. Spleen: Normal in size without focal abnormality. Adrenals/Urinary Tract: No nephrolithiasis or hydronephrosis on the right. Left-sided intrarenal stone measures about 3 mm. On the left there is hydronephrosis and hydroureter with a stone at the left UVJ that measures about 3 mm. There is left-sided perinephric stranding. Stomach/Bowel: Stomach is within normal limits. Appendix not identified. No evidence of bowel wall thickening, distention, or inflammatory changes. Diverticulosis of the sigmoid. Vascular/Lymphatic: Aortic atherosclerosis. No enlarged abdominal or pelvic lymph nodes. Reproductive: Prostate brachytherapy seeds  noted. Other: No abdominal wall hernia or abnormality. No abdominopelvic ascites. Musculoskeletal: Lumbosacral degenerative changes. IMPRESSION: 1. Left-sided hydronephrosis with perinephric stranding on the basis of a 3 mm left UVJ stone. 2. Intrarenal stone on the left. 3. Large hepatic cyst. 4. Diverticulosis. Electronically Signed   By: Layla Maw M.D.   On: 05/22/2022 17:17    Assessment & Plan:   Problem List Items Addressed This Visit     Prostate cancer (HCC)   Dr Annabell Howells q 6 months      Hypothyroidism   On Levothyroxine Check TSH      Coronary atherosclerosis   Cont on [employer], Lipitor      Osteoarthritis - Primary   Arthralgias/OA      Weight loss   No wt loss  No orders of the defined types were placed in this encounter.     Follow-up: Return in about 6 months (around 10/20/2023) for Wellness Exam.  Sonda Primes, MD

## 2023-04-19 NOTE — Assessment & Plan Note (Signed)
Dr Annabell Howells q 6 months

## 2023-04-19 NOTE — Assessment & Plan Note (Signed)
 Arthralgias/OA

## 2023-04-19 NOTE — Patient Instructions (Signed)
 Infrared sauna blanket

## 2023-04-19 NOTE — Assessment & Plan Note (Signed)
Cont on [employer], Lipitor

## 2023-04-25 ENCOUNTER — Ambulatory Visit: Payer: Medicare Other

## 2023-04-25 VITALS — BP 110/74 | HR 59 | Ht 70.0 in | Wt 185.2 lb

## 2023-04-25 DIAGNOSIS — Z0000 Encounter for general adult medical examination without abnormal findings: Secondary | ICD-10-CM

## 2023-04-25 DIAGNOSIS — Z1212 Encounter for screening for malignant neoplasm of rectum: Secondary | ICD-10-CM | POA: Diagnosis not present

## 2023-04-25 DIAGNOSIS — Z1211 Encounter for screening for malignant neoplasm of colon: Secondary | ICD-10-CM | POA: Diagnosis not present

## 2023-04-25 NOTE — Patient Instructions (Addendum)
 [name] , Thank you for taking time to come for your Medicare Wellness Visit. I appreciate your ongoing commitment to your health goals. Please review the following plan we discussed and let me know if I can assist you in the future.   Referrals/Orders/Follow-Ups/Clinician Recommendations: It was nice to meet you today.  You are due for a Shingles vaccine and a Tetanus vaccine.  Please call [facility], at [phone], to schedule your colonoscopy due in August of this year.  Keep up the good work  This is a list of the screening recommended for you and due dates:  Health Maintenance  Topic Date Due   Hepatitis C Screening  Never done   Zoster (Shingles) Vaccine (1 of 2) 02/28/1969   DTaP/Tdap/Td vaccine (2 - Td or Tdap) 03/13/2021   COVID-19 Vaccine (6 - 2024-25 season) 10/14/2022   Colon Cancer Screening  09/17/2023   Medicare Annual Wellness Visit  04/24/2024   Pneumonia Vaccine  Completed   Flu Shot  Completed   HPV Vaccine  Aged Out    Advanced directives: (Copy Requested) Please bring a copy of your health care power of attorney and living will to the office to be added to your chart at your convenience. You can mail to [facility] [address] [place], [place] [address] or email to [email or web address]  Next Medicare Annual Wellness Visit scheduled for next year: Yes

## 2023-04-25 NOTE — Progress Notes (Addendum)
 Subjective:   Breven Guidroz is a 73 y.o. who presents for a Medicare Wellness preventive visit.  Visit Complete: In person   AWV Questionnaire: No: Patient Medicare AWV questionnaire was not completed prior to this visit.  Cardiac Risk Factors include: advanced age (>67men, >19 women);male gender;dyslipidemia;Other (see comment), Risk factor comments: Prostate cancer     Objective:    Today's Vitals   04/25/23 1014  BP: 110/74  Pulse: (!) 59  SpO2: 99%  Weight: 185 lb 3.2 oz (84 kg)  Height: 5\' 10"  (1.778 m)   Body mass index is 26.57 kg/m.     04/25/2023   10:19 AM 05/22/2022    1:26 PM 04/24/2022    7:29 PM 04/19/2021   10:18 AM 04/18/2020   10:12 AM 12/12/2016    3:28 PM 09/21/2016    1:04 PM  Advanced Directives  Does Patient Have a Medical Advance Directive? Yes No Yes Yes Yes Yes Yes  Type of Estate agent of [place];Living will  Living will;Healthcare Power of Attorney Living will;Healthcare Power of Attorney Living will;Healthcare Power of [employer] Power of [place];Living will Healthcare Power of [place];Living will  Does patient want to make changes to medical advance directive?   No - Patient declined No - Patient declined No - Patient declined    Copy of Healthcare Power of Attorney in Chart? No - copy requested  No - copy requested No - copy requested No - copy requested No - copy requested No - copy requested  Would patient like information on creating a medical advance directive?  No - Patient declined         Current Medications (verified) Outpatient Encounter Medications as of 04/25/2023  Medication Sig   atorvastatin (LIPITOR) 10 MG tablet Take 1 tablet (10 mg total) by mouth daily.   cholecalciferol (VITAMIN D) 1000 UNITS tablet Take 1,000 Units by mouth every evening.    glucosamine-chondroitin 500-400 MG tablet Take 2 tablets by mouth every evening.   KRILL OIL PO One daily   L-Lysine 500 MG TABS Take by mouth.    levothyroxine (SYNTHROID) 50 MCG tablet Take 1 tablet (50 mcg total) by mouth daily.   Multiple Vitamins-Minerals (MULTIVITAL PO) Take 1 tablet by mouth every evening.   Turmeric (QC TUMERIC COMPLEX PO) Take by mouth. OTC (Patient not taking: Reported on 04/25/2023)   No facility-administered encounter medications on file as of 04/25/2023.    Allergies (verified) Pneumovax [pneumococcal polysaccharide vaccine]   History: Past Medical History:  Diagnosis Date   Anemia due to blood loss, acute 12/11/2010   10/12 post polypectomy lower GI bleed; Dr Jason Fila    Eczema    hot tub related   GI bleed    s/p polypectomy   Herpes zoster 2010   Hx of colonic polyps 12/11/2010   Last colon 2012 Dr Leone Payor   Prostate cancer [facility])    SVT (supraventricular tachycardia) [facility])    Past Surgical History:  Procedure Laterality Date   APPENDECTOMY  05/2010   COLONOSCOPY W/ BIOPSIES     CYSTOSCOPY N/A 08/30/2016   Procedure: CYSTOSCOPY FLEXIBLE;  Surgeon: Bjorn Pippin, MD;  Location: [facility];  Service: Urology;  Laterality: N/A;  no seeds found in bladder   ELECTROPHYSIOLOGY STUDY  08/24/13   EPS by Dr Ladona Ridgel with no inducible SVT or evidence of dual AV nodal physiology   PROSTATE BIOPSY     RADIOACTIVE SEED IMPLANT N/A 08/30/2016   Procedure: RADIOACTIVE SEED IMPLANT/BRACHYTHERAPY  IMPLANT WITH SPACE OAR;  Surgeon: Bjorn Pippin, MD;  Location: [facility];  Service: Urology;  Laterality: N/A;    62   seeds implanted   ROTATOR CUFF REPAIR  2003   right   SUPRAVENTRICULAR TACHYCARDIA ABLATION N/A 08/24/2013   Procedure: SUPRAVENTRICULAR TACHYCARDIA ABLATION;  Surgeon: Marinus Maw, MD;  Location: [facility] CATH LAB;  Service: Cardiovascular;  Laterality: N/A;   Family History  Problem Relation Age of Onset   Heart disease Mother        pacemaker   Cancer Mother        lung/smoker/passed at age 79   Heart attack Father    Cancer Paternal Uncle        pancreatic   Social  History   Socioeconomic History   Marital status: Married    Spouse name: Lawson Fiscal   Number of children: 2   Years of education: Not on file   Highest education level: Not on file  Occupational History   Occupation: retired  Tobacco Use   Smoking status: Never    Passive exposure: Yes   Smokeless tobacco: Never   Tobacco comments:    occasional  Vaping Use   Vaping status: Never Used  Substance and Sexual Activity   Alcohol use: Yes    Comment: rare-weekend drinker   Drug use: No   Sexual activity: Yes  Other Topics Concern   Not on file  Social History Narrative   Married, 2 kids and + grandchildren   Retired self-employed business - Psychologist, 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   Regular exercise- yes, cycling   Social Drivers of Health   Financial Resource Strain: Low Risk  (04/25/2023)   Overall Financial Resource Strain (CARDIA)    Difficulty of Paying Living Expenses: Not hard at all  Food Insecurity: No Food Insecurity (04/25/2023)   Hunger Vital Sign    Worried About Running Out of Food in the Last Year: Never true    Ran Out of Food in the Last Year: Never true  Transportation Needs: No Transportation Needs (04/25/2023)   PRAPARE - Administrator, Civil Service (Medical): No    Lack of Transportation (Non-Medical): No  Physical Activity: Sufficiently Active (04/25/2023)   Exercise Vital Sign    Days of Exercise per Week: 6 days    Minutes of Exercise per Session: 90 min  Stress: No Stress Concern Present (04/25/2023)   [employer] of Occupational Health - Occupational Stress Questionnaire    Feeling of Stress : Only a little  Social Connections: Moderately Integrated (04/25/2023)   Social Connection and Isolation Panel [NHANES]    Frequency of Communication with Friends and Family: More than three times a week    Frequency of Social Gatherings with Friends and Family: Three times a week    Attends Religious Services: Never     Active Member of Clubs or Organizations: Yes    Attends Banker Meetings: 1 to 4 times per year    Marital Status: Married    Tobacco Counseling Counseling given: Not Answered Tobacco comments: occasional    Clinical Intake:  Pre-visit preparation completed: Yes  Pain : No/denies pain     BMI - recorded: 26.75 Nutritional Status: BMI 25 -29 Overweight Nutritional Risks: None Diabetes: No  How often do you need to have someone help you when you read instructions, pamphlets, or other written materials from your doctor or pharmacy?: 1 -  Never  Interpreter Needed?: No  Information entered by :: Dalasia Predmore, RMA   Activities of Daily Living     04/25/2023   10:07 AM  In your present state of health, do you have any difficulty performing the following activities:  Hearing? 0  Vision? 0  Difficulty concentrating or making decisions? 0  Walking or climbing stairs? 0  Dressing or bathing? 0  Doing errands, shopping? 0  Preparing Food and eating ? N  Using the Toilet? N  In the past six months, have you accidently leaked urine? N  Do you have problems with loss of bowel control? N  Managing your Medications? N  Managing your Finances? N  Housekeeping or managing your Housekeeping? N    Patient Care Team: Plotnikov, Georgina Quint, MD as PCP - General Konrad Penta, MD as Referring Physician (Gastroenterology) Margaretmary Dys, MD as Consulting Physician (Radiation Oncology) Bjorn Pippin, MD as Attending Physician (Urology) Nahser, Deloris Ping, MD as Consulting Physician (Cardiology) Marinus Maw, MD as Consulting Physician (Cardiology) Thurmon Fair, MD as Consulting Physician (Cardiology) Ollen Gross, MD as Consulting Physician (Orthopedic Surgery) Aris Lot, MD as Consulting Physician (Dermatology) Pa, [facility] Eartha Inch, [place] (Orthopedic Surgery)  Indicate any recent Medical Services you may have received from other than  [facility] providers in the past year (date may be approximate).     Assessment:   This is a routine wellness examination for Maleik.  Hearing/Vision screen Hearing Screening - Comments:: Denies hearing difficulties   Vision Screening - Comments:: Wears eyeglasses   Goals Addressed             This Visit's Progress    lose weight   On track    Continue to be physically active and eat healthy. Maintain current healthy status       Depression Screen     04/25/2023   10:24 AM 04/19/2023    8:06 AM 10/02/2022    9:08 AM 08/07/2022    8:22 AM 07/02/2022    9:14 AM 05/21/2022   12:57 PM 04/24/2022    7:28 PM  PHQ 2/9 Scores  PHQ - 2 Score 0 0 0 0 0 0 0  PHQ- 9 Score 0          Fall Risk     04/25/2023   10:19 AM 04/19/2023    8:06 AM 10/02/2022    9:08 AM 08/07/2022    8:21 AM 07/02/2022    9:13 AM  Fall Risk   Falls in the past year? 0 0 0 0 0  Number falls in past yr: 0 0 0 0 0  Injury with Fall? 0 0 0 0 0  Risk for fall due to : No Fall Risks No Fall Risks No Fall Risks No Fall Risks No Fall Risks  Follow up Falls prevention discussed;Falls evaluation completed Falls evaluation completed Falls evaluation completed Falls evaluation completed Falls evaluation completed    MEDICARE RISK AT HOME:  Medicare Risk at Home Any stairs in or around the home?: Yes (2 story home) If so, are there any without handrails?: Yes Home free of loose throw rugs in walkways, pet beds, electrical cords, etc?: Yes Adequate lighting in your home to reduce risk of falls?: Yes Life alert?: No Use of a cane, walker or w/c?: No Grab bars in the bathroom?: No Shower chair or bench in shower?: Yes Elevated toilet seat or a handicapped toilet?: Yes  TIMED UP AND GO:  Was the test  performed?  Yes  Length of time to ambulate 10 feet: 15 sec Gait steady and fast without use of assistive device  Cognitive Function: Normal: Normal cognitive status assessed by direct observation by this Clinical Health  Advisor. No abnormalities found. Patient is able to answer questions in an accurate and timely manner.        04/24/2022    7:29 PM  6CIT Screen  What Year? 0 points  What month? 0 points  What time? 0 points  Count back from 20 0 points  Months in reverse 0 points  Repeat phrase 0 points  Total Score 0 points    Immunizations Immunization History  Administered Date(s) Administered   Fluad Quad(high Dose 65+) 12/29/2019   Fluad Trivalent(High Dose 65+) 12/21/2022   Influenza Split 10/30/2011   Influenza Whole 12/01/2003, 11/11/2007, 11/23/2008, 11/13/2010   Influenza, High Dose Seasonal PF 12/12/2016, 12/04/2018, 11/12/2020   Influenza,inj,Quad PF,6+ Mos 11/01/2015   Influenza-Unspecified 11/12/2013, 12/13/2017, 10/24/2021   Moderna SARS-COV2 Booster Vaccination 01/01/2020   Moderna Sars-Covid-2 Vaccination 03/20/2019, 04/18/2019   Pfizer Covid-19 Vaccine Bivalent Booster 20yrs & up 12/26/2020   Pfizer(Comirnaty)Fall Seasonal Vaccine 12 years and older 11/02/2021, 06/12/2022, 12/10/2022   Pneumococcal Conjugate-13 11/01/2015   Pneumococcal Polysaccharide-23 12/12/2016   RSV,unspecified 10/24/2021   Rsv, Bivalent, Protein Subunit Rsvpref,pf Verdis Frederickson) 10/24/2021   Tdap 03/14/2011   Unspecified SARS-COV-2 Vaccination 11/02/2021   Zoster, Live 04/28/2013    Screening Tests Health Maintenance  Topic Date Due   Hepatitis C Screening  Never done   Zoster Vaccines- Shingrix (1 of 2) 02/28/1969   DTaP/Tdap/Td (2 - Td or Tdap) 03/13/2021   COVID-19 Vaccine (7 - Mixed Product risk 2024-25 season) 06/10/2023   Colonoscopy  09/17/2023   Medicare Annual Wellness (AWV)  04/24/2024   Pneumonia Vaccine 58+ Years old  Completed   INFLUENZA VACCINE  Completed   HPV VACCINES  Aged Out    Health Maintenance  Health Maintenance Due  Topic Date Due   Hepatitis C Screening  Never done   Zoster Vaccines- Shingrix (1 of 2) 02/28/1969   DTaP/Tdap/Td (2 - Td or Tdap) 03/13/2021    Health Maintenance Items Addressed: Referral sent to GI for colonoscopy, See Nurse Notes  Additional Screening:  Vision Screening: Recommended annual ophthalmology exams for early detection of glaucoma and other disorders of the eye.  Dental Screening: Recommended annual dental exams for proper oral hygiene  Community Resource Referral / Chronic Care Management: CRR required this visit?  No   CCM required this visit?  No     Plan:     I have personally reviewed and noted the following in the patient's chart:   Medical and social history Use of alcohol, tobacco or illicit drugs  Current medications and supplements including opioid prescriptions. Patient is not currently taking opioid prescriptions. Functional ability and status Nutritional status Physical activity Advanced directives List of other physicians Hospitalizations, surgeries, and ER visits in previous 12 months Vitals Screenings to include cognitive, depression, and falls Referrals and appointments  In addition, I have reviewed and discussed with patient certain preventive protocols, quality metrics, and best practice recommendations. A written personalized care plan for preventive services as well as general preventive health recommendations were provided to patient.     Quayshaun Hubbert [address], CMA   04/25/2023   After Visit Summary: (In Person-Printed) AVS printed and given to the patient  Notes: Please refer to Routing Comments.  Medical screening examination/treatment/procedure(s) were performed by non-physician practitioner and as supervising  physician I was immediately available for consultation/collaboration.  I agree with above. Jacinta Shoe, MD

## 2023-05-01 DIAGNOSIS — Z8546 Personal history of malignant neoplasm of prostate: Secondary | ICD-10-CM | POA: Diagnosis not present

## 2023-05-08 DIAGNOSIS — N200 Calculus of kidney: Secondary | ICD-10-CM | POA: Diagnosis not present

## 2023-05-08 DIAGNOSIS — R3912 Poor urinary stream: Secondary | ICD-10-CM | POA: Diagnosis not present

## 2023-05-08 DIAGNOSIS — Z8546 Personal history of malignant neoplasm of prostate: Secondary | ICD-10-CM | POA: Diagnosis not present

## 2023-08-08 DIAGNOSIS — Z8546 Personal history of malignant neoplasm of prostate: Secondary | ICD-10-CM | POA: Diagnosis not present

## 2023-09-23 ENCOUNTER — Other Ambulatory Visit (HOSPITAL_COMMUNITY): Payer: Self-pay | Admitting: Urology

## 2023-09-23 DIAGNOSIS — Z8546 Personal history of malignant neoplasm of prostate: Secondary | ICD-10-CM

## 2023-09-23 DIAGNOSIS — R9721 Rising PSA following treatment for malignant neoplasm of prostate: Secondary | ICD-10-CM

## 2023-10-01 DIAGNOSIS — Z23 Encounter for immunization: Secondary | ICD-10-CM | POA: Diagnosis not present

## 2023-10-02 ENCOUNTER — Encounter (HOSPITAL_COMMUNITY)
Admission: RE | Admit: 2023-10-02 | Discharge: 2023-10-02 | Disposition: A | Discharge status: Home / Self Care | Source: Ambulatory Visit | Attending: Urology | Admitting: Urology

## 2023-10-02 DIAGNOSIS — R9721 Rising PSA following treatment for malignant neoplasm of prostate: Secondary | ICD-10-CM | POA: Diagnosis not present

## 2023-10-02 DIAGNOSIS — C61 Malignant neoplasm of prostate: Secondary | ICD-10-CM | POA: Diagnosis not present

## 2023-10-02 DIAGNOSIS — Z8546 Personal history of malignant neoplasm of prostate: Secondary | ICD-10-CM | POA: Insufficient documentation

## 2023-10-02 MED ORDER — FLOTUFOLASTAT F 18 GALLIUM 296-5846 MBQ/ML IV SOLN
8.5700 | Freq: Once | INTRAVENOUS | Status: AC
  Administered 2023-10-02: 8.57 via INTRAVENOUS

## 2023-10-21 ENCOUNTER — Ambulatory Visit (INDEPENDENT_AMBULATORY_CARE_PROVIDER_SITE_OTHER): Admitting: Internal Medicine

## 2023-10-21 ENCOUNTER — Encounter: Payer: Self-pay | Admitting: Internal Medicine

## 2023-10-21 VITALS — BP 116/80 | HR 51 | Temp 97.7°F | Ht 70.5 in | Wt 186.6 lb

## 2023-10-21 DIAGNOSIS — C61 Malignant neoplasm of prostate: Secondary | ICD-10-CM

## 2023-10-21 DIAGNOSIS — I2583 Coronary atherosclerosis due to lipid rich plaque: Secondary | ICD-10-CM

## 2023-10-21 DIAGNOSIS — M5440 Lumbago with sciatica, unspecified side: Secondary | ICD-10-CM

## 2023-10-21 DIAGNOSIS — E039 Hypothyroidism, unspecified: Secondary | ICD-10-CM

## 2023-10-21 DIAGNOSIS — M1990 Unspecified osteoarthritis, unspecified site: Secondary | ICD-10-CM | POA: Diagnosis not present

## 2023-10-21 NOTE — Progress Notes (Signed)
 Subjective:  Patient ID: Jacob [name], male    DOB: 1950/12/13  Age: 73 y.o. MRN: [id number]  CC: Follow-up (Patient wants to discuss a pet scan he had. States arthritis and aches and pains. )   HPI Jacob [name] presents for hypothyroidism, dyslipidemia, hypothyroidism She is doing well  Outpatient Medications Prior to Visit  Medication Sig Dispense Refill   atorvastatin  (LIPITOR) 10 MG tablet Take 1 tablet (10 mg total) by mouth daily. 90 tablet 3   cholecalciferol (VITAMIN D ) 1000 UNITS tablet Take 1,000 Units by mouth every evening.      glucosamine-chondroitin 500-400 MG tablet Take 2 tablets by mouth every evening.     KRILL OIL PO One daily     L-Lysine 500 MG TABS Take by mouth.     levothyroxine  (SYNTHROID ) 50 MCG tablet Take 1 tablet (50 mcg total) by mouth daily. 90 tablet 3   Multiple Vitamins-Minerals (MULTIVITAL PO) Take 1 tablet by mouth every evening.     Turmeric (QC TUMERIC COMPLEX PO) Take by mouth. OTC (Patient not taking: Reported on 10/21/2023)     No facility-administered medications prior to visit.    ROS: Review of Systems  Constitutional:  Negative for appetite change, fatigue and unexpected weight change.  HENT:  Negative for congestion, nosebleeds, sneezing, sore throat and trouble swallowing.   Eyes:  Negative for itching and visual disturbance.  Respiratory:  Negative for cough.   Cardiovascular:  Negative for chest pain, palpitations and leg swelling.  Gastrointestinal:  Negative for abdominal distention, blood in stool, diarrhea and nausea.  Genitourinary:  Negative for frequency and hematuria.  Musculoskeletal:  Negative for back pain, gait problem, joint swelling and neck pain.  Skin:  Negative for rash.  Neurological:  Negative for dizziness, tremors, speech difficulty and weakness.  Psychiatric/Behavioral:  Negative for agitation, dysphoric mood, sleep disturbance and suicidal ideas. The patient is not nervous/anxious.      Objective:  BP 116/80   Pulse (!) 51   Temp 97.7 F (36.5 C) (Oral)   Ht 5' 10.5 (1.791 m)   Wt 186 lb 9.6 oz (84.6 kg)   SpO2 94%   BMI 26.40 kg/m   BP Readings from Last 3 Encounters:  10/21/23 116/80  04/25/23 110/74  04/19/23 124/80    Wt Readings from Last 3 Encounters:  10/21/23 186 lb 9.6 oz (84.6 kg)  04/25/23 185 lb 3.2 oz (84 kg)  04/19/23 186 lb (84.4 kg)    Physical Exam Constitutional:      General: He is not in acute distress.    Appearance: He is well-developed.     Comments: NAD  Eyes:     Conjunctiva/sclera: Conjunctivae normal.     Pupils: Pupils are equal, round, and reactive to light.  Neck:     Thyroid : No thyromegaly.     Vascular: No JVD.  Cardiovascular:     Rate and Rhythm: Normal rate and regular rhythm.     Heart sounds: Normal heart sounds. No murmur heard.    No friction rub. No gallop.  Pulmonary:     Effort: Pulmonary effort is normal. No respiratory distress.     Breath sounds: Normal breath sounds. No wheezing or rales.  Chest:     Chest wall: No tenderness.  Abdominal:     General: Bowel sounds are normal. There is no distension.     Palpations: Abdomen is soft. There is no mass.     Tenderness: There is no abdominal tenderness. There is  no guarding or rebound.  Musculoskeletal:        General: No tenderness. Normal range of motion.     Cervical back: Normal range of motion.     Right lower leg: No edema.     Left lower leg: No edema.  Lymphadenopathy:     Cervical: No cervical adenopathy.  Skin:    General: Skin is warm and dry.     Findings: No rash.  Neurological:     Mental Status: He is alert and oriented to person, place, and time.     Cranial Nerves: No cranial nerve deficit.     Motor: No abnormal muscle tone.     Coordination: Coordination normal.     Gait: Gait normal.     Deep Tendon Reflexes: Reflexes are normal and symmetric.  Psychiatric:        Behavior: Behavior normal.        Thought Content:  Thought content normal.        Judgment: Judgment normal.     Lab Results  Component Value Date   WBC 6.0 08/01/2022   HGB 15.5 08/01/2022   HCT 46.1 08/01/2022   PLT 202.0 08/01/2022   GLUCOSE 103 (H) 04/19/2023   CHOL 189 04/19/2023   TRIG 72.0 04/19/2023   HDL 61.10 04/19/2023   LDLDIRECT 126.2 10/24/2011   LDLCALC 114 (H) 04/19/2023   ALT 11 04/19/2023   AST 20 04/19/2023   NA 140 04/19/2023   K 4.3 04/19/2023   CL 104 04/19/2023   CREATININE 1.03 04/19/2023   BUN 17 04/19/2023   CO2 22 04/19/2023   TSH 3.58 04/19/2023   PSA 0.13 06/26/2022   INR 1.0 10/12/2020   HGBA1C 5.6 04/29/2013    NM PET (PSMA) SKULL TO MID THIGH Result Date: 10/11/2023 CLINICAL DATA:  Prostate carcinoma with biochemical recurrence. Brachytherapy 6 years prior. PSA equal 0.39 EXAM: NUCLEAR MEDICINE PET SKULL BASE TO THIGH TECHNIQUE: 8.57 mCi Flotufolastat (Posluma ) was injected intravenously. Full-ring PET imaging was performed from the skull base to thigh after the radiotracer. CT data was obtained and used for attenuation correction and anatomic localization. COMPARISON:  None Available. FINDINGS: NECK No radiotracer activity in neck lymph nodes. Incidental CT finding: None. CHEST No radiotracer accumulation within mediastinal or hilar lymph nodes. No suspicious pulmonary nodules on the CT scan. Incidental CT finding: None. ABDOMEN/PELVIS Prostate: Multiple brachytherapy seeds position throughout the prostate gland. No focal radiotracer activity within the gland. Lymph nodes: There is faint radiotracer activity associated with a RIGHT external iliac lymph node measuring 4 mm on image 170/4. Activity is mild with SUV max equal 4.0. Similar mild activity associated with a distal RIGHT external iliac node with SUV max equal 3.3 on image 178. There is significant streak artifact through this region on the CT portion exam. No periaortic retroperitoneal radiotracer avid lymph nodes. Liver: No evidence of liver  metastasis. Incidental CT finding: Large benign cysts within the central liver. Nonobstructing LEFT renal calculus. Multiple diverticula of the descending colon and sigmoid colon without acute inflammation. SKELETON No focal activity to suggest skeletal metastasis. IMPRESSION: 1. No focal radiotracer activity within the prostate gland. 2. Mild radiotracer activity associated small RIGHT external iliac lymph node. Favor nonspecific benign activity. 3. No evidence of visceral metastasis or skeletal metastasis. Electronically Signed   By: Jackquline Boxer M.D.   On: 10/11/2023 14:11    Assessment & Plan:   Problem List Items Addressed This Visit     Coronary atherosclerosis  Cont on Krill oil, Lipitor      Hypothyroidism   On Levothyroxine  Check TSH      Relevant Orders   TSH   Urinalysis   CBC with Differential/Platelet   Lipid panel   Comprehensive metabolic panel with GFR   LOW BACK PAIN   Doing well Seeing a chiropractor      Relevant Orders   Urinalysis   Comprehensive metabolic panel with GFR   Osteoarthritis - Primary   Arthralgias/OA Cycling, tennis      Prostate cancer [facility])   s/p XRT seeds in 08/2016 - Dr Watt  PSA was 0.391 at Dr Maynard office PET CT was done Follow-up with Dr. Watt with repeat PSA  09/2023 PET CT:  Narrative & Impression  CLINICAL DATA:  Prostate carcinoma with biochemical recurrence. Brachytherapy 6 years prior. PSA equal 0.39   EXAM: NUCLEAR MEDICINE PET SKULL BASE TO THIGH   TECHNIQUE: 8.57 mCi Flotufolastat (Posluma ) was injected intravenously. Full-ring PET imaging was performed from the skull base to thigh after the radiotracer. CT data was obtained and used for attenuation correction and anatomic localization.   COMPARISON:  None Available.   FINDINGS: NECK   No radiotracer activity in neck lymph nodes.   Incidental CT finding: None.   CHEST   No radiotracer accumulation within mediastinal or hilar lymph nodes. No  suspicious pulmonary nodules on the CT scan.   Incidental CT finding: None.   ABDOMEN/PELVIS   Prostate: Multiple brachytherapy seeds position throughout the prostate gland. No focal radiotracer activity within the gland.   Lymph nodes: There is faint radiotracer activity associated with a RIGHT external iliac lymph node measuring 4 mm on image 170/4. Activity is mild with SUV max equal 4.0.   Similar mild activity associated with a distal RIGHT external iliac node with SUV max equal 3.3 on image 178. There is significant streak artifact through this region on the CT portion exam. No periaortic retroperitoneal radiotracer avid lymph nodes.   Liver: No evidence of liver metastasis.   Incidental CT finding: Large benign cysts within the central liver. Nonobstructing LEFT renal calculus. Multiple diverticula of the descending colon and sigmoid colon without acute inflammation.   SKELETON   No focal activity to suggest skeletal metastasis.   IMPRESSION: 1. No focal radiotracer activity within the prostate gland. 2. Mild radiotracer activity associated small RIGHT external iliac lymph node. Favor nonspecific benign activity. 3. No evidence of visceral metastasis or skeletal metastasis.     Electronically Signed   By: Jackquline Boxer M.D.   On: 10/11/2023 14:11          Relevant Orders   TSH   Urinalysis   CBC with Differential/Platelet   Comprehensive metabolic panel with GFR      No orders of the defined types were placed in this encounter.     Follow-up: Return in about 6 months (around 04/19/2024) for a follow-up visit.  Marolyn Noel, MD

## 2023-10-21 NOTE — Assessment & Plan Note (Signed)
Cont on [employer], Lipitor

## 2023-10-21 NOTE — Assessment & Plan Note (Signed)
 Arthralgias/OA Cycling, tennis

## 2023-10-21 NOTE — Assessment & Plan Note (Signed)
On Levothyroxine Check TSH 

## 2023-10-21 NOTE — Assessment & Plan Note (Addendum)
 s/p XRT seeds in 08/2016 - Dr Watt  PSA was 0.391 at Dr Maynard office PET CT was done Follow-up with Dr. Watt with repeat PSA  09/2023 PET CT:  Narrative & Impression  CLINICAL DATA:  Prostate carcinoma with biochemical recurrence. Brachytherapy 6 years prior. PSA equal 0.39   EXAM: NUCLEAR MEDICINE PET SKULL BASE TO THIGH   TECHNIQUE: 8.57 mCi Flotufolastat (Posluma ) was injected intravenously. Full-ring PET imaging was performed from the skull base to thigh after the radiotracer. CT data was obtained and used for attenuation correction and anatomic localization.   COMPARISON:  None Available.   FINDINGS: NECK   No radiotracer activity in neck lymph nodes.   Incidental CT finding: None.   CHEST   No radiotracer accumulation within mediastinal or hilar lymph nodes. No suspicious pulmonary nodules on the CT scan.   Incidental CT finding: None.   ABDOMEN/PELVIS   Prostate: Multiple brachytherapy seeds position throughout the prostate gland. No focal radiotracer activity within the gland.   Lymph nodes: There is faint radiotracer activity associated with a RIGHT external iliac lymph node measuring 4 mm on image 170/4. Activity is mild with SUV max equal 4.0.   Similar mild activity associated with a distal RIGHT external iliac node with SUV max equal 3.3 on image 178. There is significant streak artifact through this region on the CT portion exam. No periaortic retroperitoneal radiotracer avid lymph nodes.   Liver: No evidence of liver metastasis.   Incidental CT finding: Large benign cysts within the central liver. Nonobstructing LEFT renal calculus. Multiple diverticula of the descending colon and sigmoid colon without acute inflammation.   SKELETON   No focal activity to suggest skeletal metastasis.   IMPRESSION: 1. No focal radiotracer activity within the prostate gland. 2. Mild radiotracer activity associated small RIGHT external iliac lymph node.  Favor nonspecific benign activity. 3. No evidence of visceral metastasis or skeletal metastasis.     Electronically Signed   By: Jackquline Boxer M.D.   On: 10/11/2023 14:11

## 2023-10-21 NOTE — Assessment & Plan Note (Signed)
Doing well Seeing a chiropractor

## 2023-10-24 ENCOUNTER — Other Ambulatory Visit: Payer: Self-pay | Admitting: Internal Medicine

## 2023-10-24 ENCOUNTER — Telehealth: Payer: Self-pay | Admitting: Radiology

## 2023-10-24 DIAGNOSIS — Z23 Encounter for immunization: Secondary | ICD-10-CM

## 2023-10-24 NOTE — Telephone Encounter (Signed)
 Copied from CRM [id number]. Topic: Clinical - Medication Question >> Oct 24, 2023 10:07 AM Suzen RAMAN wrote: Reason for CRM:  patient would like a prescription for the  Covid Vaccine sent to [id number] - [place], [place] - [address] AT [facility] [address].

## 2023-10-25 DIAGNOSIS — Z23 Encounter for immunization: Secondary | ICD-10-CM | POA: Diagnosis not present

## 2023-10-25 MED ORDER — COVID-19 MRNA VACC (MODERNA) 50 MCG/0.5ML IM SUSP: 0.5000 mL | Freq: Once | INTRAMUSCULAR | 0 refills | Status: AC

## 2023-10-25 NOTE — Telephone Encounter (Signed)
 Called patient to inform of COVID vaccine sent over to [facility] pharmacy per PCP pt states

## 2023-10-25 NOTE — Telephone Encounter (Unsigned)
 Copied from CRM #[id number]. Topic: General - Call Back - No Documentation >> Oct 24, 2023  4:45 PM Leah C wrote: Reason for CRM: Patient called back in to follow up on prescription to be sent to [facility] for covid vaccine. He said he called [facility] and they didn't receive it.  Advised that request was sent to Dr. Garald this afternoon. Patient is leaving to go out of the country on Monday and would like to have his covid shot done before then, if possible.    [phone] (M)

## 2023-10-25 NOTE — Addendum Note (Signed)
 Addended by: [name] on: 10/25/2023 02:49 PM   Modules accepted: Orders

## 2023-10-27 MED ORDER — COVID-19 SUBUNIT VACC-NOVAVAX 5 MCG/0.5ML IM SUSY: 1.0000 | PREFILLED_SYRINGE | Freq: Once | INTRAMUSCULAR | 0 refills | Status: AC

## 2023-10-27 NOTE — Telephone Encounter (Signed)
 Okay.  Thanks.

## 2023-10-27 NOTE — Addendum Note (Signed)
 Addended by: Micki Cassel V on: 10/27/2023 11:04 PM   Modules accepted: Orders

## 2023-11-04 ENCOUNTER — Encounter: Payer: Self-pay | Admitting: Internal Medicine

## 2023-11-14 NOTE — Progress Notes (Signed)
 GU Location of Tumor / Histology: Prostate carcinoma with biochemical recurrence to right external iliac lymph node   PSA 0.39 on 08/08/2023  Marsa Maltos presented as referral from Dr. Norleen Seltzer [facility]).  Biopsies       10/02/2023 Dr. Norleen Seltzer NM PET (PSMA) Skull to Mid Thigh CLINICAL DATA:  Prostate carcinoma with biochemical recurrence. Brachytherapy 6 years prior. PSA equal 0.39  IMPRESSION: 1. No focal radiotracer activity within the prostate gland. 2. Mild radiotracer activity associated small RIGHT external iliac lymph node. Favor nonspecific benign activity. 3. No evidence of visceral metastasis or skeletal metastasis.   Past/Anticipated interventions by urology, if any: NA  Past/Anticipated interventions by medical oncology, if any:  NA  Weight changes, if any:   No  IPSS:   4 SHIM:   18  Bowel/Bladder complaints, if any:  No  Nausea/Vomiting, if any:  No  Pain issues, if any:  0/10  SAFETY ISSUES: Prior radiation? Brachytherapy (08/30/2016) Pacemaker/ICD? No Possible current pregnancy? Male Is the patient on methotrexate? No  Current Complaints / other details:  None  30 minutes spent total, including time for meaningful use questions, reviewing medication, as well as spent in face-to-face time in nurse evaluation with the patient.

## 2023-11-18 DIAGNOSIS — Z8546 Personal history of malignant neoplasm of prostate: Secondary | ICD-10-CM | POA: Diagnosis not present

## 2023-11-19 ENCOUNTER — Ambulatory Visit
Admission: RE | Admit: 2023-11-19 | Discharge: 2023-11-19 | Disposition: A | Discharge status: Home / Self Care | Source: Ambulatory Visit | Attending: Radiation Oncology | Admitting: Radiation Oncology

## 2023-11-19 ENCOUNTER — Encounter: Payer: Self-pay | Admitting: Radiation Oncology

## 2023-11-19 VITALS — BP 131/82 | HR 51 | Temp 97.7°F | Resp 17 | Ht 70.0 in | Wt 182.6 lb

## 2023-11-19 DIAGNOSIS — Z923 Personal history of irradiation: Secondary | ICD-10-CM | POA: Diagnosis not present

## 2023-11-19 DIAGNOSIS — Z79899 Other long term (current) drug therapy: Secondary | ICD-10-CM | POA: Diagnosis not present

## 2023-11-19 DIAGNOSIS — C61 Malignant neoplasm of prostate: Secondary | ICD-10-CM | POA: Insufficient documentation

## 2023-11-19 DIAGNOSIS — Z800 Family history of malignant neoplasm of digestive organs: Secondary | ICD-10-CM | POA: Insufficient documentation

## 2023-11-19 DIAGNOSIS — Z860101 Personal history of adenomatous and serrated colon polyps: Secondary | ICD-10-CM | POA: Diagnosis not present

## 2023-11-19 DIAGNOSIS — Z801 Family history of malignant neoplasm of trachea, bronchus and lung: Secondary | ICD-10-CM | POA: Insufficient documentation

## 2023-11-19 DIAGNOSIS — R9721 Rising PSA following treatment for malignant neoplasm of prostate: Secondary | ICD-10-CM | POA: Diagnosis not present

## 2023-11-19 DIAGNOSIS — I471 Supraventricular tachycardia, unspecified: Secondary | ICD-10-CM | POA: Insufficient documentation

## 2023-11-19 DIAGNOSIS — Z79890 Hormone replacement therapy (postmenopausal): Secondary | ICD-10-CM | POA: Diagnosis not present

## 2023-11-19 HISTORY — DX: Elevated prostate specific antigen (PSA): R97.20

## 2023-11-19 NOTE — Progress Notes (Signed)
 Radiation Oncology         [phone] ________________________________  Initial Outpatient Consultation  Name: Jacob [name] MRN: [id number]  Date: 11/19/2023  DOB: 25-Dec-1950  RR:Eonuwpxnc, Karlynn GAILS, MD  Watt Rush, MD   REFERRING PHYSICIAN: Watt Rush, MD  DIAGNOSIS: 73 y.o. gentleman with a rising PSA of 0.39 and solitary nodal metastasis s/p brachytherapy seen implant 08/2016 for Gleason 3+4 prostate cancer  No diagnosis found.  HISTORY OF PRESENT ILLNESS: Jacob [name] is a 73 y.o. male with a diagnosis of {biochemically recurrent} prostate cancer. He was initially referred to Dr. Watt for an elevated PSA of 4.21. He was subsequently diagnosed with Gleason 3+4 prostate cancer on 05/18/16. We met the patient in consultation on 06/04/16. At that time, he opted to proceed with brachytherapy seed implant, which was performed on 08/30/16. His PSA reached a nadir of 0.031 in 01/2021.  Since that time, his PSA has slowly risen, most recently reaching 0.39 in 07/2023. This prompted a restaging PSMA PET scan on 10/02/23 showing: no focal radiotracer activity within prostate gland; mild radiotracer activity associated with a small right external iliac lymph node; no evidence of visceral or skeletal metastasis.  The patient reviewed the PSMA scan and PSA results with his urologist and he has kindly been referred today for discussion of potential radiation treatment options.   PREVIOUS RADIATION THERAPY: Yes  08/30/16: Prostate, radioactive seed implant (brachytherapy) / 145 Gy  PAST MEDICAL HISTORY:  Past Medical History:  Diagnosis Date   Anemia due to blood loss, acute 12/11/2010   10/12 post polypectomy lower GI bleed; Dr Lucio    Eczema    hot tub related   GI bleed    s/p polypectomy   Herpes zoster 2010   Hx of colonic polyps 12/11/2010   Last colon 2012 Dr Avram   Prostate cancer [facility])    SVT (supraventricular tachycardia)       PAST SURGICAL HISTORY: Past  Surgical History:  Procedure Laterality Date   APPENDECTOMY  05/2010   COLONOSCOPY W/ BIOPSIES     CYSTOSCOPY N/A 08/30/2016   Procedure: CYSTOSCOPY FLEXIBLE;  Surgeon: Watt Rush, MD;  Location: [facility];  Service: Urology;  Laterality: N/A;  no seeds found in bladder   ELECTROPHYSIOLOGY STUDY  08/24/13   EPS by Dr Waddell with no inducible SVT or evidence of dual AV nodal physiology   PROSTATE BIOPSY     RADIOACTIVE SEED IMPLANT N/A 08/30/2016   Procedure: RADIOACTIVE SEED IMPLANT/BRACHYTHERAPY IMPLANT WITH SPACE OAR;  Surgeon: Watt Rush, MD;  Location: [facility];  Service: Urology;  Laterality: N/A;    62   seeds implanted   ROTATOR CUFF REPAIR  2003   right   SUPRAVENTRICULAR TACHYCARDIA ABLATION N/A 08/24/2013   Procedure: SUPRAVENTRICULAR TACHYCARDIA ABLATION;  Surgeon: Danelle LELON Waddell, MD;  Location: [facility] CATH LAB;  Service: Cardiovascular;  Laterality: N/A;    FAMILY HISTORY:  Family History  Problem Relation Age of Onset   Heart disease Mother        pacemaker   Cancer Mother        lung/smoker/passed at age 39   Heart attack Father    Cancer Paternal Uncle        pancreatic    SOCIAL HISTORY:  Social History   Socioeconomic History   Marital status: Married    Spouse name: Katheryn   Number of children: 2   Years of education: Not on file   Highest education level:  Not on file  Occupational History   Occupation: retired  Tobacco Use   Smoking status: Never    Passive exposure: Yes   Smokeless tobacco: Never   Tobacco comments:    occasional  Vaping Use   Vaping status: Never Used  Substance and Sexual Activity   Alcohol use: Yes    Comment: rare-weekend drinker   Drug use: No   Sexual activity: Yes  Other Topics Concern   Not on file  Social History Narrative   Married, 2 kids and + grandchildren   Retired self-employed business - Psychologist, 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   Regular exercise- yes,  cycling   Social Drivers of Health   Financial Resource Strain: Low Risk  (04/25/2023)   Overall Financial Resource Strain (CARDIA)    Difficulty of Paying Living Expenses: Not hard at all  Food Insecurity: No Food Insecurity (04/25/2023)   Hunger Vital Sign    Worried About Running Out of Food in the Last Year: Never true    Ran Out of Food in the Last Year: Never true  Transportation Needs: No Transportation Needs (04/25/2023)   PRAPARE - Administrator, Civil Service (Medical): No    Lack of Transportation (Non-Medical): No  Physical Activity: Sufficiently Active (04/25/2023)   Exercise Vital Sign    Days of Exercise per Week: 6 days    Minutes of Exercise per Session: 90 min  Stress: No Stress Concern Present (04/25/2023)   [employer] of Occupational Health - Occupational Stress Questionnaire    Feeling of Stress : Only a little  Social Connections: Moderately Integrated (04/25/2023)   Social Connection and Isolation Panel    Frequency of Communication with Friends and Family: More than three times a week    Frequency of Social Gatherings with Friends and Family: Three times a week    Attends Religious Services: Never    Active Member of Clubs or Organizations: Yes    Attends Banker Meetings: 1 to 4 times per year    Marital Status: Married  Catering manager Violence: Not At Risk (04/25/2023)   Humiliation, Afraid, Rape, and Kick questionnaire    Fear of Current or Ex-Partner: No    Emotionally Abused: No    Physically Abused: No    Sexually Abused: No    ALLERGIES: Pneumovax [pneumococcal polysaccharide vaccine]  MEDICATIONS:  Current Outpatient Medications  Medication Sig Dispense Refill   atorvastatin  (LIPITOR) 10 MG tablet Take 1 tablet (10 mg total) by mouth daily. 90 tablet 3   cholecalciferol (VITAMIN D ) 1000 UNITS tablet Take 1,000 Units by mouth every evening.      glucosamine-chondroitin 500-400 MG tablet Take 2 tablets by mouth  every evening.     KRILL OIL PO One daily     L-Lysine 500 MG TABS Take by mouth.     levothyroxine  (SYNTHROID ) 50 MCG tablet TAKE 1 TABLET(50 MCG) BY MOUTH DAILY 90 tablet 3   Multiple Vitamins-Minerals (MULTIVITAL PO) Take 1 tablet by mouth every evening.     Turmeric (QC TUMERIC COMPLEX PO) Take by mouth. OTC (Patient not taking: Reported on 10/21/2023)     No current facility-administered medications for this encounter.    REVIEW OF SYSTEMS:  On review of systems, the patient reports that he is doing well overall. He denies any chest pain, shortness of breath, cough, fevers, chills, night sweats, unintended weight changes. He denies any bowel disturbances, and denies  abdominal pain, nausea or vomiting. He denies any new musculoskeletal or joint aches or pains. His IPSS was ***, indicating *** urinary symptoms. His SHIM was ***, indicating he {does not have/likely has postoperative} erectile dysfunction. A complete review of systems is obtained and is otherwise negative.    PHYSICAL EXAM:  Wt Readings from Last 3 Encounters:  10/21/23 186 lb 9.6 oz (84.6 kg)  04/25/23 185 lb 3.2 oz (84 kg)  04/19/23 186 lb (84.4 kg)   Temp Readings from Last 3 Encounters:  10/21/23 97.7 F (36.5 C) (Oral)  04/19/23 98.2 F (36.8 C) (Oral)  10/02/22 98.6 F (37 C) (Oral)   BP Readings from Last 3 Encounters:  10/21/23 116/80  04/25/23 110/74  04/19/23 124/80   Pulse Readings from Last 3 Encounters:  10/21/23 (!) 51  04/25/23 (!) 59  04/19/23 (!) 54    /10  In general this is a well appearing *** man in no acute distress. He's alert and oriented x4 and appropriate throughout the examination. Cardiopulmonary assessment is negative for acute distress, and he exhibits normal effort.     KPS = ***  100 - Normal; no complaints; no evidence of disease. 90   - Able to carry on normal activity; minor signs or symptoms of disease. 80   - Normal activity with effort; some signs or symptoms of  disease. 44   - Cares for self; unable to carry on normal activity or to do active work. 60   - Requires occasional assistance, but is able to care for most of his personal needs. 50   - Requires considerable assistance and frequent medical care. 40   - Disabled; requires special care and assistance. 30   - Severely disabled; hospital admission is indicated although death not imminent. 20   - Very sick; hospital admission necessary; active supportive treatment necessary. 10   - Moribund; fatal processes progressing rapidly. 0     - Dead  Karnofsky DA, Abelmann WH, Craver LS and Burchenal [facility] [phone]) The use of the nitrogen mustards in the palliative treatment of carcinoma: with particular reference to bronchogenic carcinoma Cancer 1 634-56  LABORATORY DATA:  Lab Results  Component Value Date   WBC 6.0 08/01/2022   HGB 15.5 08/01/2022   HCT 46.1 08/01/2022   MCV 94.0 08/01/2022   PLT 202.0 08/01/2022   Lab Results  Component Value Date   NA 140 04/19/2023   K 4.3 04/19/2023   CL 104 04/19/2023   CO2 22 04/19/2023   Lab Results  Component Value Date   ALT 11 04/19/2023   AST 20 04/19/2023   ALKPHOS 46 04/19/2023   BILITOT 0.8 04/19/2023     RADIOGRAPHY: No results found.    IMPRESSION/PLAN: 1. 73 y.o. gentleman with a rising PSA of 0.39 and solitary nodal metastasis s/p brachytherapy seen implant 08/2016 for Gleason 3+4 prostate cancer Today, we talked to the patient and family about the findings and workup thus far. We discussed the natural history of prostate cancer carcinoma and general treatment, highlighting the role of radiotherapy in the management of oligometastatic disease***. We discussed the available radiation techniques, and focused on the details and logistics of delivery. In the case of a solitary nodal metastasis, the recommendation is for *** fractions of SBRT to the involved lymph node. We reviewed the anticipated acute and late sequelae associated with radiation  in this setting. The patient was encouraged to ask questions that were answered to his satisfaction.  At the end  of our discussion, the patient ***   We spent {CHL ONC TIME VISIT - DTPQU:[id number]} face to face with the patient and more than 50% of that time was spent in counseling and/or coordination of care.    Sabra MICAEL Rusk, PA-C    Donnice Barge, MD  [facility]  Radiation Oncology Direct Dial: [phone]  Fax: [phone] [email or web address]  Skype  LinkedIn   This document serves as a record of services personally performed by Donnice Barge, MD and Sabra Rusk, PA-C. It was created on their behalf by Izetta Neither, a trained medical scribe. The creation of this record is based on the scribe's personal observations and the provider's statements to them. This document has been checked and approved by the attending provider.

## 2023-11-21 ENCOUNTER — Telehealth: Payer: Self-pay | Admitting: Radiation Oncology

## 2023-11-21 NOTE — Telephone Encounter (Signed)
 10/9 Patient left voicemail after missed called concerning appt on 10/20.  Forward email to CT Sim and copied [place], [id number], so they are aware.

## 2023-11-22 ENCOUNTER — Encounter: Payer: Self-pay | Admitting: Urology

## 2023-11-22 NOTE — Progress Notes (Signed)
 I had to leave a message on his voicemail regarding our discussion from multidisciplinary prostate conference this morning, letting him know that the group agreed that the LN was likely reactive and consensus recommendation was to continue to monitor the PSA closely and repeat PSMA PET if PSA gets above 2.031 which would technically meet criteria for recurrence s/p XRT. He has a scheduled follow up with Dr. Watt on Monday 11/25/23 so hopefully a repeat PSA is stable at that time.  We look forward to continuing to follow his progress via correspondence and are more than happy to offer treatment if clinically indicated in the future.  I have also shared this information with Dr. Watt so that he is aware.  Jacob [name], MMS, PA-C [place]  [facility] Radiation Oncology Physician Assistant Direct Dial: [phone]  Fax: [phone]

## 2023-11-25 DIAGNOSIS — C61 Malignant neoplasm of prostate: Secondary | ICD-10-CM | POA: Diagnosis not present

## 2023-11-25 DIAGNOSIS — R9721 Rising PSA following treatment for malignant neoplasm of prostate: Secondary | ICD-10-CM | POA: Diagnosis not present

## 2023-11-29 NOTE — Progress Notes (Signed)
 Introduced myself to the patient as the prostate nurse navigator.  No barriers to care identified at this time.  He is here to discuss his radiation treatment options and will be presented at the upcoming urology/prostate conference on 10/10.  I gave him my business card and asked him to call me with questions or concerns.  Verbalized understanding.

## 2023-12-02 ENCOUNTER — Ambulatory Visit: Admitting: Radiation Oncology

## 2024-01-14 DIAGNOSIS — D1801 Hemangioma of skin and subcutaneous tissue: Secondary | ICD-10-CM | POA: Diagnosis not present

## 2024-01-14 DIAGNOSIS — L821 Other seborrheic keratosis: Secondary | ICD-10-CM | POA: Diagnosis not present

## 2024-01-14 DIAGNOSIS — D2371 Other benign neoplasm of skin of right lower limb, including hip: Secondary | ICD-10-CM | POA: Diagnosis not present

## 2024-01-14 DIAGNOSIS — Z85828 Personal history of other malignant neoplasm of skin: Secondary | ICD-10-CM | POA: Diagnosis not present

## 2024-01-14 DIAGNOSIS — D225 Melanocytic nevi of trunk: Secondary | ICD-10-CM | POA: Diagnosis not present

## 2024-01-14 DIAGNOSIS — L814 Other melanin hyperpigmentation: Secondary | ICD-10-CM | POA: Diagnosis not present

## 2024-01-14 DIAGNOSIS — L570 Actinic keratosis: Secondary | ICD-10-CM | POA: Diagnosis not present

## 2024-02-17 ENCOUNTER — Encounter: Payer: Self-pay | Admitting: Internal Medicine

## 2024-02-17 ENCOUNTER — Telehealth: Payer: Self-pay

## 2024-02-17 NOTE — Telephone Encounter (Signed)
 Called and spoke with patient-informed patient of need for recall colon; patient reports he is not near his calendar at this time but will call the office back to schedule his colonoscopy when he has his calendar; Patient advised to call back to the office at [phone] should questions/concerns arise before he is able to schedule; Patient verbalized understanding of information/instructions;

## 2024-02-18 NOTE — Telephone Encounter (Signed)
 Patient returned call to the office and scheduled appts;

## 2024-03-26 ENCOUNTER — Encounter

## 2024-04-16 ENCOUNTER — Encounter: Admitting: Internal Medicine

## 2024-04-20 ENCOUNTER — Ambulatory Visit: Admitting: Internal Medicine

## 2024-04-27 ENCOUNTER — Ambulatory Visit

## 2024-04-29 ENCOUNTER — Ambulatory Visit

## 2024-05-01 ENCOUNTER — Ambulatory Visit: Admitting: Internal Medicine
